# Patient Record
Sex: Female | Born: 1990 | Race: Black or African American | Hispanic: No | Smoking: Former smoker
Health system: Southern US, Community
[De-identification: ages and names within clinical notes are randomized; demographics above are authoritative.]

## PROBLEM LIST (undated history)

## (undated) DIAGNOSIS — F419 Anxiety disorder, unspecified: Secondary | ICD-10-CM

## (undated) DIAGNOSIS — E039 Hypothyroidism, unspecified: Secondary | ICD-10-CM

## (undated) DIAGNOSIS — I10 Essential (primary) hypertension: Secondary | ICD-10-CM

## (undated) DIAGNOSIS — N83209 Unspecified ovarian cyst, unspecified side: Secondary | ICD-10-CM

## (undated) DIAGNOSIS — B9689 Other specified bacterial agents as the cause of diseases classified elsewhere: Secondary | ICD-10-CM

## (undated) DIAGNOSIS — N898 Other specified noninflammatory disorders of vagina: Secondary | ICD-10-CM

## (undated) DIAGNOSIS — F32A Depression, unspecified: Secondary | ICD-10-CM

## (undated) DIAGNOSIS — N760 Acute vaginitis: Secondary | ICD-10-CM

## (undated) HISTORY — DX: Other specified noninflammatory disorders of vagina: N89.8

## (undated) HISTORY — DX: Depression, unspecified: F32.A

## (undated) HISTORY — DX: Anxiety disorder, unspecified: F41.9

## (undated) HISTORY — PX: CHOLECYSTECTOMY: SHX55

## (undated) HISTORY — DX: Acute vaginitis: N76.0

## (undated) HISTORY — PX: WISDOM TOOTH EXTRACTION: SHX21

## (undated) HISTORY — DX: Other specified bacterial agents as the cause of diseases classified elsewhere: B96.89

## (undated) HISTORY — DX: Hypothyroidism, unspecified: E03.9

## (undated) HISTORY — DX: Essential (primary) hypertension: I10

---
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
## 2008-01-09 DIAGNOSIS — O165 Unspecified maternal hypertension, complicating the puerperium: Secondary | ICD-10-CM

## 2008-02-18 ENCOUNTER — Emergency Department (HOSPITAL_COMMUNITY): Admission: EM | Admit: 2008-02-18 | Discharge: 2008-02-18 | Payer: Self-pay | Admitting: Emergency Medicine

## 2008-04-06 ENCOUNTER — Emergency Department (HOSPITAL_COMMUNITY): Admission: EM | Admit: 2008-04-06 | Discharge: 2008-04-07 | Payer: Self-pay | Admitting: Emergency Medicine

## 2008-05-29 ENCOUNTER — Emergency Department (HOSPITAL_COMMUNITY): Admission: EM | Admit: 2008-05-29 | Discharge: 2008-05-29 | Payer: Self-pay | Admitting: Emergency Medicine

## 2008-06-12 ENCOUNTER — Emergency Department (HOSPITAL_COMMUNITY): Admission: EM | Admit: 2008-06-12 | Discharge: 2008-06-12 | Payer: Self-pay | Admitting: Emergency Medicine

## 2008-06-21 ENCOUNTER — Ambulatory Visit (HOSPITAL_COMMUNITY): Admission: RE | Admit: 2008-06-21 | Discharge: 2008-06-21 | Payer: Self-pay | Admitting: General Surgery

## 2008-06-21 ENCOUNTER — Encounter (INDEPENDENT_AMBULATORY_CARE_PROVIDER_SITE_OTHER): Payer: Self-pay | Admitting: General Surgery

## 2009-03-28 ENCOUNTER — Emergency Department (HOSPITAL_COMMUNITY): Admission: EM | Admit: 2009-03-28 | Discharge: 2009-03-28 | Payer: Self-pay | Admitting: Emergency Medicine

## 2010-12-22 NOTE — H&P (Signed)
NAME:  Carrie Mckenzie, Carrie Mckenzie            ACCOUNT NO.:  [id number]   MEDICAL RECORD NO.:  [id number]          PATIENT TYPE:  AMB   LOCATION:  DAY                           FACILITY:  [facility]   PHYSICIAN:  Dalia Heading, M.D.  DATE OF BIRTH:  10-24-1990   DATE OF ADMISSION:  DATE OF DISCHARGE:  LH                              HISTORY & PHYSICAL   CHIEF COMPLAINT:  Cholecystitis, cholelithiasis.   HISTORY OF PRESENT ILLNESS:  The patient is a 20 year old black female  who was referred for evaluation and treatment for biliary colic  secondary to cholelithiasis.  She has been having intermittent right  upper quadrant abdominal pain with radiation to the right flank, nausea,  and indigestion over the past few months.  She had no problems during  her recent pregnancy.  She is not breast feeding currently.  It is made  worse with fatty foods.  No fever, chills, or jaundice have been noted.   PAST MEDICAL HISTORY:  As noted above.   PAST SURGICAL HISTORY:  Unremarkable.   CURRENT MEDICATIONS:  Potassium supplements for a potassium of 3.1.   ALLERGIES:  No known drug allergies.   REVIEW OF SYSTEMS:  Noncontributory.   PHYSICAL EXAMINATION:  GENERAL:  The patient is a well-developed and  well-nourished black female in no acute distress.  HEENT:  Reveals no scleral icterus.  LUNGS:  Clear to auscultation with equal breath sounds bilaterally.  HEART:  Reveals a regular rate and rhythm without S3, S4, or murmurs.  ABDOMEN:  Soft and nondistended.  She is tender in the right upper  quadrant to palpation minimally.  No hepatosplenomegaly, mass, or  hernias were identified.  Ultrasound of the gallbladder reveals  cholelithiasis with a normal common bile duct.  Her liver enzyme tests  have been elevated in the past with a normal total bilirubin.   IMPRESSION:  Cholecystitis, cholelithiasis.   PLAN:  The patient is scheduled for a laparoscopic cholecystectomy with  cholangiograms on June 21, 2008.  The risks and benefits of the  procedure including bleeding, infection, hepatobiliary injury, and the  possibility of an open procedure were fully explained to the patient,  who gave informed consent.      Dalia Heading, M.D.  Electronically Signed     MAJ/MEDQ  D:  06/13/2008  T:  06/14/2008  Job:  [id number]

## 2010-12-22 NOTE — Op Note (Signed)
NAME:  Carrie Mckenzie, Carrie Mckenzie            ACCOUNT NO.:  [id number]   MEDICAL RECORD NO.:  [id number]          PATIENT TYPE:  AMB   LOCATION:  DAY                           FACILITY:  [facility]   PHYSICIAN:  Dalia Heading, M.D.  DATE OF BIRTH:  20-Jan-1991   DATE OF PROCEDURE:  06/21/2008  DATE OF DISCHARGE:                               OPERATIVE REPORT   PREOPERATIVE DIAGNOSES:  1. Cholecystitis.  2. Cholelithiasis.  3. History of pancreatitis.   POSTOPERATIVE DIAGNOSES:  1. Cholecystitis.  2. Cholelithiasis.  3. History of pancreatitis.   PROCEDURE:  Laparoscopic cholecystectomy with cholangiograms.   SURGEON:  Dalia Heading, MD   ANESTHESIA:  General endotracheal.   INDICATIONS:  The patient is a 20 year old black female with a history  of cholelithiasis and gallstone pancreatitis who now presents for  laparoscopic cholecystectomy with cholangiograms.  The risks and  benefits of the procedure including bleeding, infection, hepatobiliary  injury, the possibility of an open procedure were fully explained to the  patient, gave informed consent.   PROCEDURE NOTE:  The patient was placed in supine position.  After  induction of general endotracheal anesthesia, the abdomen was prepped  and draped using the usual sterile technique with Betadine.  Surgical  site confirmation was performed.   An infraumbilical incision was made down to the fascia.  A Veress needle  was introduced into the abdominal cavity and confirmation of placement  was done using the saline drop test.  The abdomen was then insufflated  to 16 mmHg pressure.  An 11-mm trocar was introduced into the abdominal  cavity under direct visualization without difficulty.  The patient was  placed in reversed Trendelenburg position.  An additional 11-mm trocar  was placed in the epigastric region, 5-mm trocar was placed in the right  upper quadrant and right flank regions.  Liver was inspected and noted  to be within normal  limits.  The gallbladder was retracted superiorly  and laterally.  The dissection was begun around the infundibulum in the  gallbladder.  The cystic duct was first identified.  Its juncture to the  infundibulum fully identified.  A single EndoClip was placed proximally  on the cystic duct.  An incision was made in the cystic duct and a  cholangiocatheter was inserted.  Under digital fluoroscopy, a  cholangiogram was performed.  The hepatobiliary tree was within normal  limits.  No dilatation or intrahepatic defects were noted within the  biliary tree.  The dye flowed freely into the duodenum.  The system was  flushed with normal saline and a cholangiocatheter removed.  Multiple  EndoClips were placed distally on the cystic duct and cystic duct was  divided.  Cystic artery was likewise ligated and divided.  The  gallbladder was then freed away from the gallbladder fossa using Bovie  electrocautery.  The gallbladder was delivered through the epigastric  trocar site using EndoCatch bag.  The gallbladder fossa was inspected.  No abnormal bleeding or bile leakage was noted.  Surgicel was placed in  the gallbladder fossa.  All fluid and air were then evacuated from the  abdominal  cavity prior to removal of the trocars.   All wounds were irrigated with normal saline.  All wounds were injected  with 0.5% Sensorcaine.  The infraumbilical fascia was reapproximated  using an 0 Vicryl interrupted suture.  All skin incisions were closed  using staples.  Betadine ointment and dry sterile dressings were  applied.   All tape and needle counts were correct at the end of procedure.  The  patient was extubated in the operating room, went back to recovery room  awake in stable condition.   COMPLICATIONS:  None.   SPECIMEN:  Gallbladder.   ESTIMATED BLOOD LOSS:  Minimal.      Dalia Heading, M.D.  Electronically Signed     MAJ/MEDQ  D:  06/21/2008  T:  06/21/2008  Job:  [id number]

## 2011-05-06 LAB — URINALYSIS, ROUTINE W REFLEX MICROSCOPIC
Bilirubin Urine: NEGATIVE
Specific Gravity, Urine: 1.025
Urobilinogen, UA: 2 — ABNORMAL HIGH
pH: 6

## 2011-05-06 LAB — HCG, QUANTITATIVE, PREGNANCY: hCG, Beta Chain, Quant, S: <2

## 2011-05-06 LAB — URINE MICROSCOPIC-ADD ON

## 2011-05-10 LAB — PREGNANCY, URINE: Preg Test, Ur: NEGATIVE

## 2011-05-10 LAB — DIFFERENTIAL
Basophils Absolute: 0
Basophils Relative: 1
Eosinophils Absolute: 0.1
Monocytes Absolute: 0.4
Neutro Abs: 1.8
Neutrophils Relative %: 55

## 2011-05-10 LAB — COMPREHENSIVE METABOLIC PANEL
ALT: 245 — ABNORMAL HIGH
Albumin: 3.6
Alkaline Phosphatase: 126 — ABNORMAL HIGH
BUN: 6
Chloride: 101
Glucose, Bld: 84
Potassium: 3.8
Total Bilirubin: 0.9

## 2011-05-10 LAB — CBC
HCT: 39.4
Hemoglobin: 13.3
WBC: 3.2 — ABNORMAL LOW

## 2011-05-10 LAB — URINALYSIS, ROUTINE W REFLEX MICROSCOPIC
Bilirubin Urine: NEGATIVE
Ketones, ur: NEGATIVE
Nitrite: NEGATIVE
Protein, ur: NEGATIVE
Urobilinogen, UA: 1

## 2011-05-10 LAB — AMYLASE: Amylase: 136 — ABNORMAL HIGH

## 2011-05-11 LAB — COMPREHENSIVE METABOLIC PANEL
ALT: 65 — ABNORMAL HIGH
AST: 42 — ABNORMAL HIGH
Albumin: 3.5
CO2: 25
Calcium: 8.6
Creatinine, Ser: 0.7
GFR calc Af Amer: >60
Sodium: 135
Total Protein: 7

## 2011-05-11 LAB — DIFFERENTIAL
Eosinophils Absolute: 0
Eosinophils Relative: 0
Lymphocytes Relative: 10 — ABNORMAL LOW
Lymphs Abs: 0.5 — ABNORMAL LOW
Monocytes Absolute: 0.2
Monocytes Relative: 5

## 2011-05-11 LAB — CBC
MCHC: 34.2
Platelets: 186
RBC: 4.65

## 2011-05-13 ENCOUNTER — Encounter: Payer: Self-pay | Admitting: Emergency Medicine

## 2011-05-13 ENCOUNTER — Emergency Department (HOSPITAL_COMMUNITY): Payer: No Typology Code available for payment source

## 2011-05-13 ENCOUNTER — Emergency Department (HOSPITAL_COMMUNITY)
Admission: EM | Admit: 2011-05-13 | Discharge: 2011-05-13 | Disposition: A | Discharge status: Home / Self Care | Payer: No Typology Code available for payment source | Attending: Emergency Medicine | Admitting: Emergency Medicine

## 2011-05-13 DIAGNOSIS — Z87891 Personal history of nicotine dependence: Secondary | ICD-10-CM | POA: Insufficient documentation

## 2011-05-13 DIAGNOSIS — Z79899 Other long term (current) drug therapy: Secondary | ICD-10-CM | POA: Insufficient documentation

## 2011-05-13 DIAGNOSIS — S8000XA Contusion of unspecified knee, initial encounter: Secondary | ICD-10-CM | POA: Insufficient documentation

## 2011-05-13 MED ORDER — ACETAMINOPHEN 500 MG PO TABS
1000.0000 mg | ORAL_TABLET | Freq: Once | ORAL | Status: AC
  Administered 2011-05-13: 1000 mg via ORAL
  Filled 2011-05-13: qty 2
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
## 2011-05-20 NOTE — ED Provider Notes (Signed)
Medical screening examination/treatment/procedure(s) were performed by non-physician practitioner and as supervising physician I was immediately available for consultation/collaboration.   Joya Gaskins, MD 05/20/11 [phone]

## 2012-10-26 ENCOUNTER — Other Ambulatory Visit (HOSPITAL_COMMUNITY): Payer: Self-pay | Admitting: Family Medicine

## 2012-10-26 DIAGNOSIS — R52 Pain, unspecified: Secondary | ICD-10-CM

## 2012-11-01 ENCOUNTER — Ambulatory Visit (HOSPITAL_COMMUNITY)
Admission: RE | Admit: 2012-11-01 | Discharge: 2012-11-01 | Disposition: A | Discharge status: Home / Self Care | Payer: BC Managed Care – PPO | Source: Ambulatory Visit | Attending: Family Medicine | Admitting: Family Medicine

## 2012-11-01 DIAGNOSIS — N644 Mastodynia: Secondary | ICD-10-CM | POA: Insufficient documentation

## 2012-11-01 DIAGNOSIS — R52 Pain, unspecified: Secondary | ICD-10-CM
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
## 2013-08-09 NOTE — L&D Delivery Note (Signed)
Delivery Note At 5:51 PM a viable female was delivered via Vaginal, Spontaneous Delivery (Presentation: Right Occiput Anterior).  APGAR: 8, 9 ; weight pending .   Placenta status: Intact, Spontaneous.  Cord: 3 vessels with the following complications: None.  Cord pH: pending  Anesthesia: Epidural  Episiotomy: None Lacerations: None Suture Repair: N/A Est. Blood Loss (mL):   Mom to postpartum.  Baby to Couplet care / Skin to Skin.  Joanna Puff 04/02/2014, 6:27 PM

## 2013-08-17 ENCOUNTER — Other Ambulatory Visit: Payer: Self-pay | Admitting: Obstetrics & Gynecology

## 2013-08-17 DIAGNOSIS — O3680X0 Pregnancy with inconclusive fetal viability, not applicable or unspecified: Secondary | ICD-10-CM

## 2013-08-21 ENCOUNTER — Ambulatory Visit (INDEPENDENT_AMBULATORY_CARE_PROVIDER_SITE_OTHER): Payer: BC Managed Care – PPO

## 2013-08-21 ENCOUNTER — Encounter (INDEPENDENT_AMBULATORY_CARE_PROVIDER_SITE_OTHER): Payer: Self-pay

## 2013-08-21 DIAGNOSIS — O3680X0 Pregnancy with inconclusive fetal viability, not applicable or unspecified: Secondary | ICD-10-CM

## 2013-08-21 NOTE — Progress Notes (Signed)
U/S(7+2wks)-transabdominal u/s performed, single IUP with +FCA noted, FHR-153 bpm, cx appears long and closed (3.1cm), bilateral adnexa appears WNL C.L. Noted on LT, CRL c/w LMP dates

## 2013-08-27 ENCOUNTER — Encounter: Payer: Self-pay | Admitting: Women's Health

## 2013-08-27 ENCOUNTER — Other Ambulatory Visit (HOSPITAL_COMMUNITY)
Admission: RE | Admit: 2013-08-27 | Discharge: 2013-08-27 | Disposition: A | Discharge status: Home / Self Care | Payer: BC Managed Care – PPO | Source: Ambulatory Visit | Attending: Obstetrics & Gynecology | Admitting: Obstetrics & Gynecology

## 2013-08-27 ENCOUNTER — Ambulatory Visit (INDEPENDENT_AMBULATORY_CARE_PROVIDER_SITE_OTHER): Payer: BC Managed Care – PPO | Admitting: Women's Health

## 2013-08-27 VITALS — BP 118/60 | Ht 65.0 in | Wt 187.0 lb

## 2013-08-27 DIAGNOSIS — Z113 Encounter for screening for infections with a predominantly sexual mode of transmission: Secondary | ICD-10-CM | POA: Insufficient documentation

## 2013-08-27 DIAGNOSIS — Z124 Encounter for screening for malignant neoplasm of cervix: Secondary | ICD-10-CM | POA: Insufficient documentation

## 2013-08-27 DIAGNOSIS — Z3480 Encounter for supervision of other normal pregnancy, unspecified trimester: Secondary | ICD-10-CM | POA: Insufficient documentation

## 2013-08-27 DIAGNOSIS — Z331 Pregnant state, incidental: Secondary | ICD-10-CM

## 2013-08-27 DIAGNOSIS — F419 Anxiety disorder, unspecified: Secondary | ICD-10-CM | POA: Insufficient documentation

## 2013-08-27 DIAGNOSIS — Z1389 Encounter for screening for other disorder: Secondary | ICD-10-CM

## 2013-08-27 DIAGNOSIS — O219 Vomiting of pregnancy, unspecified: Secondary | ICD-10-CM

## 2013-08-27 DIAGNOSIS — F17200 Nicotine dependence, unspecified, uncomplicated: Secondary | ICD-10-CM

## 2013-08-27 DIAGNOSIS — O99340 Other mental disorders complicating pregnancy, unspecified trimester: Secondary | ICD-10-CM

## 2013-08-27 DIAGNOSIS — O09299 Supervision of pregnancy with other poor reproductive or obstetric history, unspecified trimester: Secondary | ICD-10-CM

## 2013-08-27 LAB — CBC
HCT: 38.7 % (ref 36.0–46.0)
Hemoglobin: 13 g/dL (ref 12.0–15.0)
MCH: 28.8 pg (ref 26.0–34.0)
MCHC: 33.6 g/dL (ref 30.0–36.0)
MCV: 85.8 fL (ref 78.0–100.0)
PLATELETS: 244 10*3/uL (ref 150–400)
RBC: 4.51 MIL/uL (ref 3.87–5.11)
RDW: 14.2 % (ref 11.5–15.5)
WBC: 4.5 10*3/uL (ref 4.0–10.5)

## 2013-08-27 LAB — POCT URINALYSIS DIPSTICK
GLUCOSE UA: NEGATIVE
Ketones, UA: NEGATIVE
Leukocytes, UA: NEGATIVE
NITRITE UA: NEGATIVE

## 2013-08-27 LAB — HEPATITIS B SURFACE ANTIGEN: HEP B S AG: NEGATIVE

## 2013-08-27 LAB — HIV ANTIBODY (ROUTINE TESTING W REFLEX): HIV: NONREACTIVE

## 2013-08-27 MED ORDER — DOXYLAMINE-PYRIDOXINE 10-10 MG PO TBEC: 10.0000 mg | DELAYED_RELEASE_TABLET | ORAL | Status: DC

## 2013-08-27 MED ORDER — PRENATAL PLUS 27-1 MG PO TABS: 1.0000 | ORAL_TABLET | Freq: Every day | ORAL | Status: DC

## 2013-08-27 NOTE — Patient Instructions (Signed)
Nausea & Vomiting  Have saltine crackers or pretzels by your bed and eat a few bites before you raise your head out of bed in the morning  Eat small frequent meals throughout the day instead of large meals  Drink plenty of fluids throughout the day to stay hydrated, just don't drink a lot of fluids with your meals.  This can make your stomach fill up faster making you feel sick  Do not brush your teeth right after you eat  Products with real ginger are good for nausea, like ginger ale and ginger hard candy Make sure it says made with real ginger!  Sucking on sour candy like lemon heads is also good for nausea  If your prenatal vitamins make you nauseated, take them at night so you will sleep through the nausea  If you feel like you need medicine for the nausea & vomiting please let us know  If you are unable to keep any fluids or food down please let us know   Constipation  Drink plenty of fluid, preferably water, throughout the day  Eat foods high in fiber such as fruits, vegetables, and grains  Exercise, such as walking, is a good way to keep your bowels regular  Drink warm fluids, especially warm prune juice, or decaf coffee  Eat a 1/2 cup of real oatmeal (not instant), 1/2 cup applesauce, and 1/2-1 cup warm prune juice every day  If needed, you may take Colace (docusate sodium) stool softener once or twice a day to help keep the stool soft. If you are pregnant, wait until you are out of your first trimester (12-14 weeks of pregnancy)  If you still are having problems with constipation, you may take Miralax once daily as needed to help keep your bowels regular.  If you are pregnant, wait until you are out of your first trimester (12-14 weeks of pregnancy)    Pregnancy - First Trimester During sexual intercourse, millions of sperm go into the vagina. Only 1 sperm will penetrate and fertilize the female egg while it is in the Fallopian tube. One week later, the fertilized egg  implants into the wall of the uterus. An embryo begins to develop into a baby. At 6 to 8 weeks, the eyes and face are formed and the heartbeat can be seen on ultrasound. At the end of 12 weeks (first trimester), all the baby's organs are formed. Now that you are pregnant, you will want to do everything you can to have a healthy baby. Two of the most important things are to get good prenatal care and follow your caregiver's instructions. Prenatal care is all the medical care you receive before the baby's birth. It is given to prevent, find, and treat problems during the pregnancy and childbirth. PRENATAL EXAMS  During prenatal visits, your weight, blood pressure, and urine are checked. This is done to make sure you are healthy and progressing normally during the pregnancy.  A pregnant woman should gain 25 to 35 pounds during the pregnancy. However, if you are overweight or underweight, your caregiver will advise you regarding your weight.  Your caregiver will ask and answer questions for you.  Blood work, cervical cultures, other necessary tests, and a Pap test are done during your prenatal exams. These tests are done to check on your health and the probable health of your baby. Tests are strongly recommended and done for HIV with your permission. This is the virus that causes AIDS. These tests are done because medicines   can be given to help prevent your baby from being born with this infection should you have been infected without knowing it. Blood work is also used to find out your blood type, previous infections, and follow your blood levels (hemoglobin).  Low hemoglobin (anemia) is common during pregnancy. Iron and vitamins are given to help prevent this. Later in the pregnancy, blood tests for diabetes will be done along with any other tests if any problems develop.  You may need other tests to make sure you and the baby are doing well. CHANGES DURING THE FIRST TRIMESTER  Your body goes through  many changes during pregnancy. They vary from person to person. Talk to your caregiver about changes you notice and are concerned about. Changes can include:  Your menstrual period stops.  The egg and sperm carry the genes that determine what you look like. Genes from you and your partner are forming a baby. The female genes determine whether the baby is a boy or a girl.  Your body increases in girth and you may feel bloated.  Feeling sick to your stomach (nauseous) and throwing up (vomiting). If the vomiting is uncontrollable, call your caregiver.  Your breasts will begin to enlarge and become tender.  Your nipples may stick out more and become darker.  The need to urinate more. Painful urination may mean you have a bladder infection.  Tiring easily.  Loss of appetite.  Cravings for certain kinds of food.  At first, you may gain or lose a couple of pounds.  You may have changes in your emotions from day to day (excited to be pregnant or concerned something may go wrong with the pregnancy and baby).  You may have more vivid and strange dreams. HOME CARE INSTRUCTIONS   It is very important to avoid all smoking, alcohol and non-prescribed drugs during your pregnancy. These affect the formation and growth of the baby. Avoid chemicals while pregnant to ensure the delivery of a healthy infant.  Start your prenatal visits by the 12th week of pregnancy. They are usually scheduled monthly at first, then more often in the last 2 months before delivery. Keep your caregiver's appointments. Follow your caregiver's instructions regarding medicine use, blood and lab tests, exercise, and diet.  During pregnancy, you are providing food for you and your baby. Eat regular, well-balanced meals. Choose foods such as meat, fish, milk and other low fat dairy products, vegetables, fruits, and whole-grain breads and cereals. Your caregiver will tell you of the ideal weight gain.  You can help morning  sickness by keeping soda crackers at the bedside. Eat a couple before arising in the morning. You may want to use the crackers without salt on them.  Eating 4 to 5 small meals rather than 3 large meals a day also may help the nausea and vomiting.  Drinking liquids between meals instead of during meals also seems to help nausea and vomiting.  A physical sexual relationship may be continued throughout pregnancy if there are no other problems. Problems may be early (premature) leaking of amniotic fluid from the membranes, vaginal bleeding, or belly (abdominal) pain.  Exercise regularly if there are no restrictions. Check with your caregiver or physical therapist if you are unsure of the safety of some of your exercises. Greater weight gain will occur in the last 2 trimesters of pregnancy. Exercising will help:  Control your weight.  Keep you in shape.  Prepare you for labor and delivery.  Help you lose your pregnancy   weight after you deliver your baby.  Wear a good support or jogging bra for breast tenderness during pregnancy. This may help if worn during sleep too.  Ask when prenatal classes are available. Begin classes when they are offered.  Do not use hot tubs, steam rooms, or saunas.  Wear your seat belt when driving. This protects you and your baby if you are in an accident.  Avoid raw meat, uncooked cheese, cat litter boxes, and soil used by cats throughout the pregnancy. These carry germs that can cause birth defects in the baby.  The first trimester is a good time to visit your dentist for your dental health. Getting your teeth cleaned is okay. Use a softer toothbrush and brush gently during pregnancy.  Ask for help if you have financial, counseling, or nutritional needs during pregnancy. Your caregiver will be able to offer counseling for these needs as well as refer you for other special needs.  Do not take any medicines or herbs unless told by your caregiver.  Inform your  caregiver if there is any mental or physical domestic violence.  Make a list of emergency phone numbers of family, friends, hospital, and police and fire departments.  Write down your questions. Take them to your prenatal visit.  Do not douche.  Do not cross your legs.  If you have to stand for long periods of time, rotate you feet or take small steps in a circle.  You may have more vaginal secretions that may require a sanitary pad. Do not use tampons or scented sanitary pads. MEDICINES AND DRUG USE IN PREGNANCY  Take prenatal vitamins as directed. The vitamin should contain 1 milligram of folic acid. Keep all vitamins out of reach of children. Only a couple vitamins or tablets containing iron may be fatal to a baby or young child when ingested.  Avoid use of all medicines, including herbs, over-the-counter medicines, not prescribed or suggested by your caregiver. Only take over-the-counter or prescription medicines for pain, discomfort, or fever as directed by your caregiver. Do not use aspirin, ibuprofen, or naproxen unless directed by your caregiver.  Let your caregiver also know about herbs you may be using.  Alcohol is related to a number of birth defects. This includes fetal alcohol syndrome. All alcohol, in any form, should be avoided completely. Smoking will cause low birth rate and premature babies.  Street or illegal drugs are very harmful to the baby. They are absolutely forbidden. A baby born to an addicted mother will be addicted at birth. The baby will go through the same withdrawal an adult does.  Let your caregiver know about any medicines that you have to take and for what reason you take them. SEEK MEDICAL CARE IF:  You have any concerns or worries during your pregnancy. It is better to call with your questions if you feel they cannot wait, rather than worry about them. SEEK IMMEDIATE MEDICAL CARE IF:   An unexplained oral temperature above 102 F (38.9 C) develops,  or as your caregiver suggests.  You have leaking of fluid from the vagina (birth canal). If leaking membranes are suspected, take your temperature and inform your caregiver of this when you call.  There is vaginal spotting or bleeding. Notify your caregiver of the amount and how many pads are used.  You develop a bad smelling vaginal discharge with a change in the color.  You continue to feel sick to your stomach (nauseated) and have no relief from remedies suggested. You   vomit blood or coffee ground-like materials.  You lose more than 2 pounds of weight in 1 week.  You gain more than 2 pounds of weight in 1 week and you notice swelling of your face, hands, feet, or legs.  You gain 5 pounds or more in 1 week (even if you do not have swelling of your hands, face, legs, or feet).  You get exposed to German measles and have never had them.  You are exposed to fifth disease or chickenpox.  You develop belly (abdominal) pain. Round ligament discomfort is a common non-cancerous (benign) cause of abdominal pain in pregnancy. Your caregiver still must evaluate this.  You develop headache, fever, diarrhea, pain with urination, or shortness of breath.  You fall or are in a car accident or have any kind of trauma.  There is mental or physical violence in your home. Document Released: 07/20/2001 Document Revised: 04/19/2012 Document Reviewed: 01/21/2009 [employer] Patient Information 2014 [employer].  

## 2013-08-27 NOTE — Progress Notes (Addendum)
Subjective:    Carrie Mckenzie is a 23 y.o. 833P1011 African American female at 7521w1d by LMP which is w/in 3d of 7wk u/s, being seen today for her first obstetrical visit.  Her obstetrical history is significant for smoker and term SVD in 2009 complicated by chorioamnionitis w/ neonatal pneumonia requiring 1wk NICU stay.  Pregnancy history fully reviewed. Reports h/o LSIL pap 3058yr ago.    Patient reports nausea, vomiting has improved, and constipation. Denies vb, cramping, uti s/s, abnormal/malodorous vag d/c, or vulvovaginal itching/irritation.  Filed Vitals:   08/27/13 0941  BP: 118/60  Weight: 187 lb (84.823 kg)    HISTORY: OB History  Gravida Para Term Preterm AB SAB TAB Ectopic Multiple Living  3 1 1  0 1 1 0 0 0 1    # Outcome Date GA Lbr Len/2nd Weight Sex Delivery Anes PTL Lv  3 CUR           2 TRM 01/09/08 7225w0d  7 lb 6 oz (3.345 kg) F SVD EPI  Y  1 SAB              Past Medical History  Diagnosis Date  . Anxiety    Past Surgical History  Procedure Laterality Date  . Cholecystectomy    . Wisdom tooth extraction     Family History  Problem Relation Age of Onset  . Diabetes Mother   . Thyroid disease Mother     had thyroid removed  . Hypertension Father   . Cancer Father     prostate  . Diabetes Maternal Aunt   . Diabetes Maternal Uncle   . Cancer Maternal Grandmother     ovarian, uterine  . Diabetes Maternal Aunt   . Diabetes Maternal Aunt   . Diabetes Maternal Uncle   . Diabetes Maternal Uncle   . Diabetes Maternal Uncle   . Diabetes Maternal Uncle   . Diabetes Maternal Uncle   . Diabetes Maternal Uncle      Exam    Pelvic Exam:    Perineum: Normal Perineum   Vulva: normal   Vagina:  normal mucosa, normal discharge, no palpable nodules   Uterus Normal size/shape/contour for GA     Cervix: normal   Adnexa: Not palpable   Urinary: urethral meatus normal    System:     Skin: normal coloration and turgor, no rashes    Neurologic: oriented,  normal mood   Extremities: normal strength, tone, and muscle mass   HEENT PERRLA   Mouth/Teeth mucous membranes moist   Cardiovascular: regular rate and rhythm   Respiratory:  appears well, vitals normal, no respiratory distress, acyanotic, normal RR   Abdomen: soft, non-tender    Thin prep pap smear obtained high risk HPV cotesting FHR: + via informal u/s   Assessment:    Pregnancy: G3P1011 Patient Active Problem List   Diagnosis Date Noted  . Supervision of other normal pregnancy 08/27/2013    Priority: High      7921w1d G3P1011 New OB visit Smoker Nausea of pregnancy Constipation Anxiety on zoloft   Plan:     Initial labs drawn Continue prenatal vitamins Problem list reviewed and updated Reviewed n/v relief measures and warning s/s to report Reviewed recommended weight gain based on pre-gravid BMI Encouraged well-balanced diet Genetic Screening discussed Integrated Screen: requested Cystic fibrosis screening discussed requested Ultrasound discussed; fetal survey: requested Follow up in 4 weeks for 1st it/nt and visit Rx diclegis, prior auth sent, and 2 samples given Rx prenatal  plus w/ 11 RF Gave printed info on 1st trimester, n/v of pregnancy, and constipation Continue zoloft 100mg  daily as previously rx'd by PCP  Marge Duncans 08/27/2013 10:23 AM

## 2013-08-28 ENCOUNTER — Encounter: Payer: Self-pay | Admitting: Women's Health

## 2013-08-28 DIAGNOSIS — Z2839 Other underimmunization status: Secondary | ICD-10-CM | POA: Insufficient documentation

## 2013-08-28 DIAGNOSIS — O9989 Other specified diseases and conditions complicating pregnancy, childbirth and the puerperium: Secondary | ICD-10-CM

## 2013-08-28 DIAGNOSIS — Z283 Underimmunization status: Secondary | ICD-10-CM | POA: Insufficient documentation

## 2013-08-28 DIAGNOSIS — O09899 Supervision of other high risk pregnancies, unspecified trimester: Secondary | ICD-10-CM | POA: Insufficient documentation

## 2013-08-28 LAB — DRUG SCREEN, URINE, NO CONFIRMATION
AMPHETAMINE SCRN UR: NEGATIVE
Barbiturate Quant, Ur: NEGATIVE
Benzodiazepines.: NEGATIVE
Cocaine Metabolites: NEGATIVE
Creatinine,U: 669.7 mg/dL
MARIJUANA METABOLITE: NEGATIVE
METHADONE: NEGATIVE
OPIATE SCREEN, URINE: NEGATIVE
PROPOXYPHENE: NEGATIVE
Phencyclidine (PCP): NEGATIVE

## 2013-08-28 LAB — OXYCODONE SCREEN, UA, RFLX CONFIRM: Oxycodone Screen, Ur: NEGATIVE ng/mL

## 2013-08-28 LAB — ANTIBODY SCREEN: ANTIBODY SCREEN: NEGATIVE

## 2013-08-28 LAB — ABO AND RH: RH TYPE: POSITIVE

## 2013-08-28 LAB — URINALYSIS
Glucose, UA: NEGATIVE mg/dL
HGB URINE DIPSTICK: NEGATIVE
Nitrite: NEGATIVE
PROTEIN: NEGATIVE mg/dL
Specific Gravity, Urine: >1.03 — ABNORMAL HIGH (ref 1.005–1.030)
UROBILINOGEN UA: 1 mg/dL (ref 0.0–1.0)
pH: 6 (ref 5.0–8.0)

## 2013-08-28 LAB — SICKLE CELL SCREEN: Sickle Cell Screen: NEGATIVE

## 2013-08-28 LAB — CYSTIC FIBROSIS DIAGNOSTIC STUDY

## 2013-08-28 LAB — RPR

## 2013-08-28 LAB — RUBELLA SCREEN: RUBELLA: 0.84 {index} (ref <0.90)

## 2013-08-28 LAB — VARICELLA ZOSTER ANTIBODY, IGG: Varicella IgG: 903.7 Index — ABNORMAL HIGH (ref <135.00)

## 2013-08-29 ENCOUNTER — Encounter: Payer: Self-pay | Admitting: Women's Health

## 2013-08-29 LAB — URINE CULTURE
COLONY COUNT: NO GROWTH
Organism ID, Bacteria: NO GROWTH

## 2013-08-30 ENCOUNTER — Telehealth: Payer: Self-pay | Admitting: Women's Health

## 2013-08-30 DIAGNOSIS — O09899 Supervision of other high risk pregnancies, unspecified trimester: Secondary | ICD-10-CM

## 2013-08-30 DIAGNOSIS — Z3480 Encounter for supervision of other normal pregnancy, unspecified trimester: Secondary | ICD-10-CM

## 2013-08-30 DIAGNOSIS — O9989 Other specified diseases and conditions complicating pregnancy, childbirth and the puerperium: Principal | ICD-10-CM

## 2013-08-30 DIAGNOSIS — Z283 Underimmunization status: Secondary | ICD-10-CM

## 2013-08-30 MED ORDER — TERCONAZOLE 0.4 % VA CREA: 1.0000 | TOPICAL_CREAM | Freq: Every day | VAGINAL | Status: DC

## 2013-08-30 NOTE — Telephone Encounter (Signed)
terazol 7 e prescribed 

## 2013-09-24 ENCOUNTER — Other Ambulatory Visit: Payer: Medicaid Other

## 2013-09-24 ENCOUNTER — Ambulatory Visit (INDEPENDENT_AMBULATORY_CARE_PROVIDER_SITE_OTHER): Payer: BC Managed Care – PPO | Admitting: Women's Health

## 2013-09-24 ENCOUNTER — Ambulatory Visit (INDEPENDENT_AMBULATORY_CARE_PROVIDER_SITE_OTHER): Payer: BC Managed Care – PPO

## 2013-09-24 ENCOUNTER — Other Ambulatory Visit: Payer: Self-pay | Admitting: Women's Health

## 2013-09-24 ENCOUNTER — Encounter: Payer: Medicaid Other | Admitting: Women's Health

## 2013-09-24 ENCOUNTER — Encounter: Payer: Self-pay | Admitting: Women's Health

## 2013-09-24 VITALS — BP 120/60 | Wt 177.0 lb

## 2013-09-24 DIAGNOSIS — O210 Mild hyperemesis gravidarum: Secondary | ICD-10-CM

## 2013-09-24 DIAGNOSIS — Z1389 Encounter for screening for other disorder: Secondary | ICD-10-CM

## 2013-09-24 DIAGNOSIS — O09299 Supervision of pregnancy with other poor reproductive or obstetric history, unspecified trimester: Secondary | ICD-10-CM

## 2013-09-24 DIAGNOSIS — O99340 Other mental disorders complicating pregnancy, unspecified trimester: Secondary | ICD-10-CM

## 2013-09-24 DIAGNOSIS — Z36 Encounter for antenatal screening of mother: Secondary | ICD-10-CM

## 2013-09-24 DIAGNOSIS — Z331 Pregnant state, incidental: Secondary | ICD-10-CM

## 2013-09-24 DIAGNOSIS — Z3480 Encounter for supervision of other normal pregnancy, unspecified trimester: Secondary | ICD-10-CM

## 2013-09-24 DIAGNOSIS — O219 Vomiting of pregnancy, unspecified: Secondary | ICD-10-CM

## 2013-09-24 LAB — POCT URINALYSIS DIPSTICK
Blood, UA: NEGATIVE
Glucose, UA: NEGATIVE
Leukocytes, UA: NEGATIVE
Nitrite, UA: NEGATIVE

## 2013-09-24 MED ORDER — PROMETHAZINE HCL 25 MG RE SUPP: 25.0000 mg | Freq: Four times a day (QID) | RECTAL | Status: DC | PRN

## 2013-09-24 MED ORDER — ONDANSETRON 8 MG PO TBDP: 8.0000 mg | ORAL_TABLET | Freq: Three times a day (TID) | ORAL | Status: DC | PRN

## 2013-09-24 NOTE — Progress Notes (Signed)
U/S(12+1wks)-active fetus, meas c/w dates, fluid wnl, anterior Gr 0 placenta, cx appears closed, FHR-153 bpm, NB present, NT- 1.1654mm

## 2013-09-24 NOTE — Patient Instructions (Signed)
Hyperemesis Gravidarum  Hyperemesis gravidarum is a severe form of nausea and vomiting that happens during pregnancy. Hyperemesis is worse than morning sickness. It may cause you to have nausea or vomiting all day for many days. It may keep you from eating and drinking enough food and liquids. Hyperemesis usually occurs during the first half (the first 20 weeks) of pregnancy. It often goes away once a woman is in her second half of pregnancy. However, sometimes hyperemesis continues through an entire pregnancy.   CAUSES   The cause of this condition is not completely known but is thought to be related to changes in the body's hormones when pregnant. It could be from the high level of the pregnancy hormone or an increase in estrogen in the body.   SIGNS AND SYMPTOMS   · Severe nausea and vomiting.  · Nausea that does not go away.  · Vomiting that does not allow you to keep any food down.  · Weight loss and body fluid loss (dehydration).  · Having no desire to eat or not liking food you have previously enjoyed.  DIAGNOSIS   Your health care provider will do a physical exam and ask you about your symptoms. He or she may also order blood tests and urine tests to make sure something else is not causing the problem.   TREATMENT   You may only need medicine to control the problem. If medicines do not control the nausea and vomiting, you will be treated in the hospital to prevent dehydration, increased acid in the blood (acidosis), weight loss, and changes in the electrolytes in your body that may harm the unborn baby (fetus). You may need IV fluids.   HOME CARE INSTRUCTIONS   · Only take over-the-counter or prescription medicines as directed by your health care provider.  · Try eating a couple of dry crackers or toast in the morning before getting out of bed.  · Avoid foods and smells that upset your stomach.  · Avoid fatty and spicy foods.  · Eat 5 6 small meals a day.  · Do not drink when eating meals. Drink between  meals.  · For snacks, eat high-protein foods, such as cheese.  · Eat or suck on things that have ginger in them. Ginger helps nausea.  · Avoid food preparation. The smell of food can spoil your appetite.  · Avoid iron pills and iron in your multivitamins until after 3 4 months of being pregnant. However, consult with your health care provider before stopping any prescribed iron pills.  SEEK MEDICAL CARE IF:   · Your abdominal pain increases.  · You have a severe headache.  · You have vision problems.  · You are losing weight.  SEEK IMMEDIATE MEDICAL CARE IF:   · You are unable to keep fluids down.  · You vomit blood.  · You have constant nausea and vomiting.  · You have excessive weakness.  · You have extreme thirst.  · You have dizziness or fainting.  · You have a fever or persistent symptoms for more than 2 3 days.  · You have a fever and your symptoms suddenly get worse.  MAKE SURE YOU:   · Understand these instructions.  · Will watch your condition.  · Will get help right away if you are not doing well or get worse.  Document Released: 07/26/2005 Document Revised: 05/16/2013 Document Reviewed: 03/07/2013  ExitCare® Patient Information ©2014 [employer].

## 2013-09-24 NOTE — Progress Notes (Signed)
Denies cramping, lof, vb, uti s/s.  Was doing well on diclegis QID, she tried to cut it back to one q am and she has not been able to keep anything down since. Last solid food that stayed down was on Sat am, small amt of water this am that stayed down but otherwise most liquids come up. Has lost 22lbs from pre-preg weight. States this happened w/ her last pregnancy as well- never had to be hospitalized. She has moist mucous membranes, no tenting of skin- appears well- not in distress. Last vomited prior to coming in. States she vomits maybe 3x/day. Urine 4+ ketonuria. MDs not currently in office to discuss- so I consulted w/ Dr. Debroah LoopArnold who [facility]- doesn't think she needs to come in at this time- will try phenergan suppositories/zofran odt. Recommended small sips of ginger ale/water to stay hydrated, small bites of toast/crackers, and supplementing w/ boost/ensure.  Reviewed today's u/s, warning s/s to report including if antiemetics not helping [facility].  All questions answered. F/U in 2d to see how she's doing. 1st IT today.

## 2013-09-26 ENCOUNTER — Ambulatory Visit (INDEPENDENT_AMBULATORY_CARE_PROVIDER_SITE_OTHER): Payer: BC Managed Care – PPO | Admitting: Women's Health

## 2013-09-26 ENCOUNTER — Encounter: Payer: Self-pay | Admitting: Women's Health

## 2013-09-26 VITALS — BP 110/60 | Wt 178.0 lb

## 2013-09-26 DIAGNOSIS — O99340 Other mental disorders complicating pregnancy, unspecified trimester: Secondary | ICD-10-CM

## 2013-09-26 DIAGNOSIS — Z1389 Encounter for screening for other disorder: Secondary | ICD-10-CM

## 2013-09-26 DIAGNOSIS — O09299 Supervision of pregnancy with other poor reproductive or obstetric history, unspecified trimester: Secondary | ICD-10-CM

## 2013-09-26 DIAGNOSIS — O210 Mild hyperemesis gravidarum: Secondary | ICD-10-CM

## 2013-09-26 DIAGNOSIS — Z3480 Encounter for supervision of other normal pregnancy, unspecified trimester: Secondary | ICD-10-CM

## 2013-09-26 DIAGNOSIS — Z331 Pregnant state, incidental: Secondary | ICD-10-CM

## 2013-09-26 LAB — POCT URINALYSIS DIPSTICK
Blood, UA: NEGATIVE
GLUCOSE UA: NEGATIVE
KETONES UA: NEGATIVE
NITRITE UA: NEGATIVE

## 2013-09-26 NOTE — Patient Instructions (Signed)

## 2013-09-26 NOTE — Progress Notes (Addendum)
Denies cramping, lof, vb, uti s/s.  Feeling much better. Just on zofran now. No vomiting since Monday. Able to eat/drink. Has gained 1lb! No ketones in urine. Reports constipation- will give printed info.  Some dizziness when standing too quickly. Recommended changing positions slowly. Reviewed warning s/s to report.  All questions answered. F/U in 4wks for 2nd IT and visit.

## 2013-09-29 LAB — MATERNAL SCREEN, INTEGRATED #1

## 2013-10-15 ENCOUNTER — Encounter: Payer: Self-pay | Admitting: Women's Health

## 2013-10-15 ENCOUNTER — Ambulatory Visit (INDEPENDENT_AMBULATORY_CARE_PROVIDER_SITE_OTHER): Payer: Medicaid Other | Admitting: Women's Health

## 2013-10-15 VITALS — BP 106/60 | Wt 174.5 lb

## 2013-10-15 DIAGNOSIS — O09299 Supervision of pregnancy with other poor reproductive or obstetric history, unspecified trimester: Secondary | ICD-10-CM

## 2013-10-15 DIAGNOSIS — O99330 Smoking (tobacco) complicating pregnancy, unspecified trimester: Secondary | ICD-10-CM

## 2013-10-15 DIAGNOSIS — Z3480 Encounter for supervision of other normal pregnancy, unspecified trimester: Secondary | ICD-10-CM

## 2013-10-15 DIAGNOSIS — Z131 Encounter for screening for diabetes mellitus: Secondary | ICD-10-CM

## 2013-10-15 DIAGNOSIS — R55 Syncope and collapse: Secondary | ICD-10-CM

## 2013-10-15 DIAGNOSIS — O99340 Other mental disorders complicating pregnancy, unspecified trimester: Secondary | ICD-10-CM

## 2013-10-15 DIAGNOSIS — Z1389 Encounter for screening for other disorder: Secondary | ICD-10-CM

## 2013-10-15 DIAGNOSIS — Z331 Pregnant state, incidental: Secondary | ICD-10-CM

## 2013-10-15 DIAGNOSIS — F17200 Nicotine dependence, unspecified, uncomplicated: Secondary | ICD-10-CM

## 2013-10-15 LAB — POCT URINALYSIS DIPSTICK
GLUCOSE UA: NEGATIVE
Leukocytes, UA: NEGATIVE
Nitrite, UA: NEGATIVE
RBC UA: NEGATIVE

## 2013-10-15 LAB — GLUCOSE, POCT (MANUAL RESULT ENTRY): POC Glucose: 75 mg/dl (ref 70–99)

## 2013-10-15 NOTE — Progress Notes (Addendum)
Work-in: states she was getting ready this am in a hot bathroom and started getting really hot and feeling like she was going to pass out. She sat down in floor and wasn't able to see x ~604mins, never lost consciousness, was able to communicate w/ daughter who brought her cold rag and water. States she went in to work and her bp was 90s/60s- so likely r/t physiologic hypotension of 2nd trimester. Feels much better now. CBG here today 75. Hasn't eaten yet this am. Recommended increasing fluid intake, small frequent meals/snacks, increasing salt intake. If occurs again, sit/lie down immediately, get juice/snack, and don't stand until it resolves. If happens frequently to let us know.  States she is doing much better w/ n/v, hasn't vomited in >1wk, appetite has improved, actually has cravings, and some days she reports 'eating all day', although she has lost 4lbs since last visit. No longer taking diclegis d/t it not helping. Zofran helps the most, hasn't had to use phenergan lately. Moist mucous membranes. Still smoking ~4cigs/day, wants to quit. Encouraged smoking cessation, discussed potential risks associated w/ smoking and pregnancy. [facility] referral sent. To f/u in 1wk as scheduled for 2nd IT and visit.

## 2013-10-15 NOTE — Patient Instructions (Signed)
Second Trimester of Pregnancy The second trimester is from week 13 through week 28, months 4 through 6. The second trimester is often a time when you feel your best. Your body has also adjusted to being pregnant, and you begin to feel better physically. Usually, morning sickness has lessened or quit completely, you may have more energy, and you may have an increase in appetite. The second trimester is also a time when the fetus is growing rapidly. At the end of the sixth month, the fetus is about 9 inches long and weighs about 1 pounds. You will likely begin to feel the baby move (quickening) between 18 and 20 weeks of the pregnancy. BODY CHANGES Your body goes through many changes during pregnancy. The changes vary from woman to woman.   Your weight will continue to increase. You will notice your lower abdomen bulging out.  You may begin to get stretch marks on your hips, abdomen, and breasts.  You may develop headaches that can be relieved by medicines approved by your caregiver.  You may urinate more often because the fetus is pressing on your bladder.  You may develop or continue to have heartburn as a result of your pregnancy.  You may develop constipation because certain hormones are causing the muscles that push waste through your intestines to slow down.  You may develop hemorrhoids or swollen, bulging veins (varicose veins).  You may have back pain because of the weight gain and pregnancy hormones relaxing your joints between the bones in your pelvis and as a result of a shift in weight and the muscles that support your balance.  Your breasts will continue to grow and be tender.  Your gums may bleed and may be sensitive to brushing and flossing.  Dark spots or blotches (chloasma, mask of pregnancy) may develop on your face. This will likely fade after the baby is born.  A dark line from your belly button to the pubic area (linea nigra) may appear. This will likely fade after the  baby is born. WHAT TO EXPECT AT YOUR PRENATAL VISITS During a routine prenatal visit:  You will be weighed to make sure you and the fetus are growing normally.  Your blood pressure will be taken.  Your abdomen will be measured to track your baby's growth.  The fetal heartbeat will be listened to.  Any test results from the previous visit will be discussed. Your caregiver may ask you:  How you are feeling.  If you are feeling the baby move.  If you have had any abnormal symptoms, such as leaking fluid, bleeding, severe headaches, or abdominal cramping.  If you have any questions. Other tests that may be performed during your second trimester include:  Blood tests that check for:  Low iron levels (anemia).  Gestational diabetes (between 24 and 28 weeks).  Rh antibodies.  Urine tests to check for infections, diabetes, or protein in the urine.  An ultrasound to confirm the proper growth and development of the baby.  An amniocentesis to check for possible genetic problems.  Fetal screens for spina bifida and Down syndrome. HOME CARE INSTRUCTIONS   Avoid all smoking, herbs, alcohol, and unprescribed drugs. These chemicals affect the formation and growth of the baby.  Follow your caregiver's instructions regarding medicine use. There are medicines that are either safe or unsafe to take during pregnancy.  Exercise only as directed by your caregiver. Experiencing uterine cramps is a good sign to stop exercising.  Continue to eat regular,   healthy meals.  Wear a good support bra for breast tenderness.  Do not use hot tubs, steam rooms, or saunas.  Wear your seat belt at all times when driving.  Avoid raw meat, uncooked cheese, cat litter boxes, and soil used by cats. These carry germs that can cause birth defects in the baby.  Take your prenatal vitamins.  Try taking a stool softener (if your caregiver approves) if you develop constipation. Eat more high-fiber foods,  such as fresh vegetables or fruit and whole grains. Drink plenty of fluids to keep your urine clear or pale yellow.  Take warm sitz baths to soothe any pain or discomfort caused by hemorrhoids. Use hemorrhoid cream if your caregiver approves.  If you develop varicose veins, wear support hose. Elevate your feet for 15 minutes, 3 4 times a day. Limit salt in your diet.  Avoid heavy lifting, wear low heel shoes, and practice good posture.  Rest with your legs elevated if you have leg cramps or low back pain.  Visit your dentist if you have not gone yet during your pregnancy. Use a soft toothbrush to brush your teeth and be gentle when you floss.  A sexual relationship may be continued unless your caregiver directs you otherwise.  Continue to go to all your prenatal visits as directed by your caregiver. SEEK MEDICAL CARE IF:   You have dizziness.  You have mild pelvic cramps, pelvic pressure, or nagging pain in the abdominal area.  You have persistent nausea, vomiting, or diarrhea.  You have a bad smelling vaginal discharge.  You have pain with urination. SEEK IMMEDIATE MEDICAL CARE IF:   You have a fever.  You are leaking fluid from your vagina.  You have spotting or bleeding from your vagina.  You have severe abdominal cramping or pain.  You have rapid weight gain or loss.  You have shortness of breath with chest pain.  You notice sudden or extreme swelling of your face, hands, ankles, feet, or legs.  You have not felt your baby move in over an hour.  You have severe headaches that do not go away with medicine.  You have vision changes. Document Released: 07/20/2001 Document Revised: 03/28/2013 Document Reviewed: 09/26/2012 ExitCare Patient Information 2014 [employer].  

## 2013-10-18 ENCOUNTER — Encounter (HOSPITAL_COMMUNITY): Payer: Self-pay | Admitting: Emergency Medicine

## 2013-10-18 ENCOUNTER — Emergency Department (HOSPITAL_COMMUNITY)
Admission: EM | Admit: 2013-10-18 | Discharge: 2013-10-18 | Disposition: A | Discharge status: Home / Self Care | Payer: BC Managed Care – PPO | Attending: Emergency Medicine | Admitting: Emergency Medicine

## 2013-10-18 ENCOUNTER — Telehealth: Payer: Self-pay | Admitting: *Deleted

## 2013-10-18 DIAGNOSIS — F411 Generalized anxiety disorder: Secondary | ICD-10-CM | POA: Insufficient documentation

## 2013-10-18 DIAGNOSIS — Z79899 Other long term (current) drug therapy: Secondary | ICD-10-CM | POA: Insufficient documentation

## 2013-10-18 DIAGNOSIS — R197 Diarrhea, unspecified: Secondary | ICD-10-CM | POA: Insufficient documentation

## 2013-10-18 DIAGNOSIS — R112 Nausea with vomiting, unspecified: Secondary | ICD-10-CM | POA: Insufficient documentation

## 2013-10-18 DIAGNOSIS — F17200 Nicotine dependence, unspecified, uncomplicated: Secondary | ICD-10-CM | POA: Insufficient documentation

## 2013-10-18 DIAGNOSIS — R000 Tachycardia, unspecified: Secondary | ICD-10-CM | POA: Insufficient documentation

## 2013-10-18 LAB — CBC WITH DIFFERENTIAL/PLATELET
Basophils Absolute: 0 10*3/uL (ref 0.0–0.1)
Basophils Relative: 1 % (ref 0–1)
EOS ABS: 0.1 10*3/uL (ref 0.0–0.7)
EOS PCT: 2 % (ref 0–5)
HCT: 34 % — ABNORMAL LOW (ref 36.0–46.0)
HEMOGLOBIN: 12 g/dL (ref 12.0–15.0)
LYMPHS ABS: 1.3 10*3/uL (ref 0.7–4.0)
Lymphocytes Relative: 26 % (ref 12–46)
MCH: 29.9 pg (ref 26.0–34.0)
MCHC: 35.3 g/dL (ref 30.0–36.0)
MCV: 84.6 fL (ref 78.0–100.0)
MONOS PCT: 7 % (ref 3–12)
Monocytes Absolute: 0.3 10*3/uL (ref 0.1–1.0)
Neutro Abs: 3.3 10*3/uL (ref 1.7–7.7)
Neutrophils Relative %: 66 % (ref 43–77)
Platelets: 228 10*3/uL (ref 150–400)
RBC: 4.02 MIL/uL (ref 3.87–5.11)
RDW: 13.3 % (ref 11.5–15.5)
WBC: 5 10*3/uL (ref 4.0–10.5)

## 2013-10-18 LAB — COMPREHENSIVE METABOLIC PANEL
ALK PHOS: 58 U/L (ref 39–117)
ALT: 17 U/L (ref 0–35)
AST: 22 U/L (ref 0–37)
Albumin: 3.3 g/dL — ABNORMAL LOW (ref 3.5–5.2)
BUN: 5 mg/dL — AB (ref 6–23)
CALCIUM: 9.1 mg/dL (ref 8.4–10.5)
CO2: 24 mEq/L (ref 19–32)
Chloride: 104 mEq/L (ref 96–112)
Creatinine, Ser: 0.58 mg/dL (ref 0.50–1.10)
GFR calc non Af Amer: >90 mL/min (ref >90)
GLUCOSE: 85 mg/dL (ref 70–99)
Potassium: 3.8 mEq/L (ref 3.7–5.3)
Sodium: 139 mEq/L (ref 137–147)
TOTAL PROTEIN: 7.5 g/dL (ref 6.0–8.3)
Total Bilirubin: 0.3 mg/dL (ref 0.3–1.2)

## 2013-10-18 LAB — URINALYSIS, ROUTINE W REFLEX MICROSCOPIC
Glucose, UA: NEGATIVE mg/dL
Hgb urine dipstick: NEGATIVE
LEUKOCYTES UA: NEGATIVE
NITRITE: NEGATIVE
PH: 6 (ref 5.0–8.0)
Specific Gravity, Urine: >1.03 — ABNORMAL HIGH (ref 1.005–1.030)
UROBILINOGEN UA: 0.2 mg/dL (ref 0.0–1.0)

## 2013-10-18 LAB — LIPASE, BLOOD: LIPASE: 20 U/L (ref 11–59)

## 2013-10-18 LAB — URINE MICROSCOPIC-ADD ON

## 2013-10-18 MED ORDER — SODIUM CHLORIDE 0.9 % IV BOLUS (SEPSIS)
1000.0000 mL | Freq: Once | INTRAVENOUS | Status: AC
  Administered 2013-10-18: 1000 mL via INTRAVENOUS

## 2013-10-18 MED ORDER — ONDANSETRON HCL 4 MG/2ML IJ SOLN
4.0000 mg | INTRAMUSCULAR | Status: DC | PRN
  Administered 2013-10-18: 4 mg via INTRAVENOUS
  Filled 2013-10-18: qty 2

## 2013-10-18 MED ORDER — SODIUM CHLORIDE 0.9 % IV SOLN: INTRAVENOUS | Status: DC

## 2013-10-18 MED ORDER — SODIUM CHLORIDE 0.9 % IV BOLUS (SEPSIS): 500.0000 mL | Freq: Once | INTRAVENOUS | Status: DC

## 2013-10-18 MED ORDER — SODIUM CHLORIDE 0.9 % IV BOLUS (SEPSIS)
500.0000 mL | Freq: Once | INTRAVENOUS | Status: AC
Start: 2013-10-18 — End: 2013-10-18
  Administered 2013-10-18: 500 mL via INTRAVENOUS

## 2013-10-18 MED ORDER — PROMETHAZINE HCL 25 MG PO TABS: 25.0000 mg | ORAL_TABLET | Freq: Four times a day (QID) | ORAL | Status: DC | PRN

## 2013-10-18 NOTE — Telephone Encounter (Signed)
Spoke with pt. Pt has vomiting and diarrhea. Mouth not moist. Not urinating much. Advised to go to [facility] for eval and possible fluids. Pt voiced understanding. JSY

## 2013-10-18 NOTE — ED Notes (Addendum)
Patient c/o nausea, vomiting, and diarrhea that started this morning. Patient [facility] because she is [redacted] weeks pregnant. Patient unsure of any fevers but felt like she has had one. Patient reports using zofran with no relief.

## 2013-10-18 NOTE — ED Provider Notes (Signed)
CSN: [id number]     Arrival date & time 10/18/13  1004 History   First MD Initiated Contact with Patient 10/18/13 1250     Chief Complaint  Patient presents with  . Emesis      HPI Pt was seen at 1255. Per pt, c/o gradual onset and persistence of multiple intermittent episodes of N/V/D that began this morning.   Describes the stools as "watery." Others in household with similar symptoms. Hx G2P1011, EDC 04/07/14, with EGA 15 and 4/7 weeks. Pt states she has been taking zofran ODT without relief. States she has not taken phenergan PR due to diarrhea. Denies abd pain, no pelvic pain, no vaginal bleeding/discharge, no dysuria/hematuria, no CP/SOB, no back pain, no fevers, no black or blood in stools or emesis.     Past Medical History  Diagnosis Date  . Anxiety    Past Surgical History  Procedure Laterality Date  . Cholecystectomy    . Wisdom tooth extraction     Family History  Problem Relation Age of Onset  . Diabetes Mother   . Thyroid disease Mother     had thyroid removed  . Hypertension Father   . Cancer Father     prostate  . Diabetes Maternal Aunt   . Diabetes Maternal Uncle   . Cancer Maternal Grandmother     ovarian, uterine  . Diabetes Maternal Aunt   . Diabetes Maternal Aunt   . Diabetes Maternal Uncle   . Diabetes Maternal Uncle   . Diabetes Maternal Uncle   . Diabetes Maternal Uncle   . Diabetes Maternal Uncle   . Diabetes Maternal Uncle    History  Substance Use Topics  . Smoking status: Current Every Day Smoker -- 0.04 packs/day for 10 years    Types: Cigarettes  . Smokeless tobacco: Never Used  . Alcohol Use: No   OB History   Grav Para Term Preterm Abortions TAB SAB Ect Mult Living   3 1 1  0 1 0 1 0 0 1     Review of Systems ROS: Statement: All systems negative except as marked or noted in the HPI; Constitutional: Negative for fever and chills. ; ; Eyes: Negative for eye pain, redness and discharge. ; ; ENMT: Negative for ear pain, hoarseness,  nasal congestion, sinus pressure and sore throat. ; ; Cardiovascular: Negative for chest pain, palpitations, diaphoresis, dyspnea and peripheral edema. ; ; Respiratory: Negative for cough, wheezing and stridor. ; ; Gastrointestinal: +N/V/D. Negative for abdominal pain, blood in stool, hematemesis, jaundice and rectal bleeding. . ; ; Genitourinary: Negative for dysuria, flank pain and hematuria. ; ; GYN:  No vaginal bleeding, no vaginal discharge, no vulvar pain. ;; Musculoskeletal: Negative for back pain and neck pain. Negative for swelling and trauma.; ; Skin: Negative for pruritus, rash, abrasions, blisters, bruising and skin lesion.; ; Neuro: Negative for headache, lightheadedness and neck stiffness. Negative for weakness, altered level of consciousness , altered mental status, extremity weakness, paresthesias, involuntary movement, seizure and syncope.      Allergies  Review of patient's allergies indicates no known allergies.  Home Medications   Current Outpatient Rx  Name  Route  Sig  Dispense  Refill  . Doxylamine-Pyridoxine (DICLEGIS) 10-10 MG TBEC   Oral   Take 1-2 tablets by mouth 3 (three) times daily. Take 1 tablet in am, 1 in afternoon and 2 at night         . Multiple Vitamin (MULTIVITAMIN) tablet   Oral   Take 1  tablet by mouth daily.         . ondansetron (ZOFRAN-ODT) 8 MG disintegrating tablet   Oral   Take 8 mg by mouth daily.         . sertraline (ZOLOFT) 100 MG tablet   Oral   Take 100 mg by mouth at bedtime.          . promethazine (PHENERGAN) 25 MG suppository   Rectal   Place 1 suppository (25 mg total) rectally every 6 (six) hours as needed for nausea or vomiting.   20 each   0    BP 106/46  Pulse 101  Temp(Src) 98.5 F (36.9 C) (Oral)  Resp 16  Ht 5\' 5"  (1.651 m)  Wt 171 lb 1.6 oz (77.61 kg)  BMI 28.47 kg/m2  SpO2 98%  LMP 07/01/2013 Filed Vitals:   10/18/13 1048 10/18/13 1306 10/18/13 1413  BP: 122/63 106/46 101/43  Pulse: 121 101 79   Temp: 98 F (36.7 C) 98.5 F (36.9 C) 98.2 F (36.8 C)  TempSrc: Oral Oral Oral  Resp: 20 16 19   Height: 5\' 5"  (1.651 m)    Weight: 171 lb 1.6 oz (77.61 kg)    SpO2: 100% 98% 100%    Physical Exam 1300: Physical examination:  Nursing notes reviewed; Vital signs and O2 SAT reviewed;  Constitutional: Well developed, Well nourished, Well hydrated, In no acute distress; Head:  Normocephalic, atraumatic; Eyes: EOMI, PERRL, No scleral icterus; ENMT: Mouth and pharynx normal, Mucous membranes moist; Neck: Supple, Full range of motion, No lymphadenopathy; Cardiovascular: Tachycardic rate and rhythm, No murmur, rub, or gallop; Respiratory: Breath sounds clear & equal bilaterally, No rales, rhonchi, wheezes.  Speaking full sentences with ease, Normal respiratory effort/excursion; Chest: Nontender, Movement normal; Abdomen: Soft, Nontender, Nondistended, Normal bowel sounds; Genitourinary: No CVA tenderness; Extremities: Pulses normal, No tenderness, No edema, No calf edema or asymmetry.; Neuro: AA&Ox3, Major CN grossly intact.  Speech clear. No gross focal motor or sensory deficits in extremities.; Skin: Color normal, Warm, Dry.   ED Course  Procedures     EKG Interpretation None      MDM  MDM Reviewed: previous chart, nursing note and vitals Reviewed previous: labs Interpretation: labs   Results for orders placed during the hospital encounter of 10/18/13  CBC WITH DIFFERENTIAL      Result Value Ref Range   WBC 5.0  4.0 - 10.5 K/uL   RBC 4.02  3.87 - 5.11 MIL/uL   Hemoglobin 12.0  12.0 - 15.0 g/dL   HCT 62.134.0 (*) 30.836.0 - 65.746.0 %   MCV 84.6  78.0 - 100.0 fL   MCH 29.9  26.0 - 34.0 pg   MCHC 35.3  30.0 - 36.0 g/dL   RDW 84.613.3  96.211.5 - 95.215.5 %   Platelets 228  150 - 400 K/uL   Neutrophils Relative % 66  43 - 77 %   Neutro Abs 3.3  1.7 - 7.7 K/uL   Lymphocytes Relative 26  12 - 46 %   Lymphs Abs 1.3  0.7 - 4.0 K/uL   Monocytes Relative 7  3 - 12 %   Monocytes Absolute 0.3  0.1 - 1.0  K/uL   Eosinophils Relative 2  0 - 5 %   Eosinophils Absolute 0.1  0.0 - 0.7 K/uL   Basophils Relative 1  0 - 1 %   Basophils Absolute 0.0  0.0 - 0.1 K/uL  COMPREHENSIVE METABOLIC PANEL      Result Value Ref Range  Sodium 139  137 - 147 mEq/L   Potassium 3.8  3.7 - 5.3 mEq/L   Chloride 104  96 - 112 mEq/L   CO2 24  19 - 32 mEq/L   Glucose, Bld 85  70 - 99 mg/dL   BUN 5 (*) 6 - 23 mg/dL   Creatinine, Ser 0.98  0.50 - 1.10 mg/dL   Calcium 9.1  8.4 - 11.9 mg/dL   Total Protein 7.5  6.0 - 8.3 g/dL   Albumin 3.3 (*) 3.5 - 5.2 g/dL   AST 22  0 - 37 U/L   ALT 17  0 - 35 U/L   Alkaline Phosphatase 58  39 - 117 U/L   Total Bilirubin 0.3  0.3 - 1.2 mg/dL   GFR calc non Af Amer >90  >90 mL/min   GFR calc Af Amer >90  >90 mL/min  URINALYSIS, ROUTINE W REFLEX MICROSCOPIC      Result Value Ref Range   Color, Urine YELLOW  YELLOW   APPearance CLEAR  CLEAR   Specific Gravity, Urine >1.030 (*) 1.005 - 1.030   pH 6.0  5.0 - 8.0   Glucose, UA NEGATIVE  NEGATIVE mg/dL   Hgb urine dipstick NEGATIVE  NEGATIVE   Bilirubin Urine SMALL (*) NEGATIVE   Ketones, ur >80 (*) NEGATIVE mg/dL   Protein, ur TRACE (*) NEGATIVE mg/dL   Urobilinogen, UA 0.2  0.0 - 1.0 mg/dL   Nitrite NEGATIVE  NEGATIVE   Leukocytes, UA NEGATIVE  NEGATIVE  URINE MICROSCOPIC-ADD ON      Result Value Ref Range   Squamous Epithelial / LPF MANY (*) RARE   WBC, UA 3-6  <3 WBC/hpf   Bacteria, UA MANY (*) RARE  LIPASE, BLOOD      Result Value Ref Range   Lipase 20  11 - 59 U/L    1410:  Udip appears contaminated; UC pending. Pt has tol PO well while in the ED without N/V.  No stooling while in the ED.  Abd remains benign, VSS. Feels better and wants to go home now. Dx and testing d/w pt and family.  Questions answered.  Verb understanding, agreeable to d/c home with outpt f/u.                                                          Laray Anger, DO 10/20/13 1441

## 2013-10-18 NOTE — Discharge Instructions (Signed)
°Emergency Department Resource Guide °1) Find a Doctor and Pay Out of Pocket °Although you won't have to find out who is covered by your insurance plan, it is a good idea to ask around and get recommendations. You will then need to call the office and see if the doctor you have chosen will accept you as a new patient and what types of options they offer for patients who are self-pay. Some doctors offer discounts or will set up payment plans for their patients who do not have insurance, but you will need to ask so you aren't surprised when you get to your appointment. ° °2) Contact Your Local Health Department °Not all health departments have doctors that can see patients for sick visits, but many do, so it is worth a call to see if yours does. If you don't know where your local health department is, you can check in your phone book. The CDC also has a tool to help you locate your state's health department, and many state websites also have listings of all of their local health departments. ° °3) Find a Walk-in Clinic °If your illness is not likely to be very severe or complicated, you may want to try a walk in clinic. These are popping up all over the country in pharmacies, drugstores, and shopping centers. They're usually staffed by nurse practitioners or physician assistants that have been trained to treat common illnesses and complaints. They're usually fairly quick and inexpensive. However, if you have serious medical issues or chronic medical problems, these are probably not your best option. ° °No Primary Care Doctor: °- Call Health Connect at  832-8000 - they can help you locate a primary care doctor that  accepts your insurance, provides certain services, etc. °- Physician Referral Service- 1-800-533-3463 ° °Chronic Pain Problems: °Organization         Address  Phone   Notes  °[facility]  (336) 297-2271 Patients need to be referred by their primary care doctor.  ° °Medication  Assistance: °Organization         Address  Phone   Notes  °[place] [facility] [address] °[place], [place] [address] (336) 641-8030 --Must be a resident of [place] °-- Must have NO insurance coverage whatsoever (no Medicaid/ Medicare, etc.) °-- The pt. MUST have a primary care doctor that directs their care regularly and follows them in the community °  °[facility]  (866) 331-1348   °[employer]  (888) 892-1162   ° °Agencies that provide inexpensive medical care: °Organization         Address  Phone   Notes  °[facility]  (336) 832-8035   °[facility]    (336) 832-7272   °[facility] [address] °[place], [place] [address] (336) 832-4777   °[facility] [place] [facility] [address], °[place] (336) 271-4999   °[facility]    (336) 373-0678   °[facility]    (336) 272-1050   °[facility] ° [address], [place] Phone:  (336) 832-4444, Fax:  (336) 832-4440 Hours of Operation:  9 am - 6 pm, M-F.  Also accepts Medicaid/Medicare and self-pay.  °[facility] ° [address], [place] Phone: (336) 832-3150, Fax: (336) 832-3151. Hours of Operation:  8:30 am - 5:30 pm, M-F.  Also accepts Medicaid and self-pay.  °[facility] [place] [address], [place] Phone: (336) 878-6027   °[facility] [address], [place], [place] (336)723-1848, Ext. 123 Mondays & Thursdays: 7-9 AM.  First 15 patients are seen on a first come, first serve basis. °  ° °Medicaid-accepting [place] Providers: ° °Organization         Address  Phone   Notes  °[facility] [address], [place] (336) 641-2100 Also accepts self-pay patients.  °[facility] [address], [place] ° (336) 856-9996   °[facility] [address], [place]  (336) 288-8857   °[facility] [address] [place] [address], [place] (336) 299-7000   °Veita Bland [address], [place]  ° (336) 373-1557 Only accepts [place] Access Medicaid patients after they have their name applied to their card.  ° °Self-Pay (no insurance) in [place]: ° °Organization         Address  Phone   Notes  °Sickle Cell Patients, [facility] [address], [place] (336) 832-1970   °[facility] [address], [place] (336) 832-4400   °[facility] ° [address], [place] (336) 992-4800   °[facility]/Dr. Osei-Bonsu ° [address] [place] [address], [place] or [address], [place] (336) 841-8500 Phone number for both [place] and [place] locations is the same.  °[facility] [address], [place] (336) 299-0000   °[facility] [address] [place] [address], [place] or [address] (336) 852-7530 °(336) 878-2260   °[facility] [address], [place] (336) 350-1642, phone; (336) 294-5005, fax Sees patients 1st and 3rd Saturday of every month.  Must not qualify for public or private insurance (i.e. Medicaid, Medicare, [place] Health Choice, Veterans' Benefits) • Household income should be no more than 200% of the poverty level •The clinic cannot treat you if you are pregnant or think you are pregnant • Sexually transmitted diseases are not treated at the clinic.  ° ° °Dental Care: °Organization         Address  Phone  Notes  °[place] [facility] [address], [place] (336) 641-6152 Accepts children up to age 21 who are enrolled in Medicaid or [place] Health Choice; pregnant women with a Medicaid card; and children who have applied for Medicaid or [place] Health Choice, but were declined, whose parents can pay a reduced fee at time of service.  °[place]  [facility] [place]  [address], [place] (336) 641-7733 Accepts children up to age 21 who are enrolled in Medicaid or [place] Health Choice; pregnant women with a Medicaid card; and children who have applied for Medicaid or [place] Health Choice, but were declined, whose parents can pay a reduced fee at time of service.  °[facility] ° [address], [place] (336) 641-4533 Patients are seen by appointment only. Walk-ins are not accepted. [facility] will see patients 18 years of age and older. °Monday - Tuesday (8am-5pm) °Most Wednesdays (8:30-5pm) °$30 per visit, cash only  °[facility] ° [address], [place] (336) 641-4533 Patients are seen by appointment only. Walk-ins are not accepted. [facility] will see patients 18 years of age and older. °One   Wednesday Evening (Monthly: Volunteer Based).  $30 per visit, cash only  °[facility]  (919) 537-3737 for adults; Children under age 4, call Graduate Pediatric Dentistry at ([phone]) [phone]. Children aged 4-14, please call ([phone]) [phone] to request a pediatric application. ° Dental services are provided in all areas of dental care including fillings, crowns and bridges, complete and partial dentures, implants, gum treatment, root canals, and extractions. Preventive care is also provided. Treatment is provided to both adults and children. °Patients are selected via a lottery and there is often a waiting list. °  °[facility] [address], °[place] ° (336) 763-8833 [email or web address] °  °[facility] [address], [place], [place] (336)723-1848, Ext. 123 Second and Fourth Thursday of each month, opens at 6:30 AM; Clinic ends at 9 AM.  Patients are seen on a first-come first-served basis, and a limited number are seen during each clinic.  ° °[facility] ° [address], [place], [place] (336) 723-7904    Eligibility Requirements °You must have lived in [place], [place], or [place] counties for at least the last three months. °  You cannot be eligible for state or federal sponsored healthcare insurance, including Veterans Administration, Medicaid, or Medicare. °  You generally cannot be eligible for healthcare insurance through your employer.  °  How to apply: °Eligibility screenings are held every Tuesday and Wednesday afternoon from 1:00 pm until 4:00 pm. You do not need an appointment for the interview!  °[facility] [address], [place], [place] 336-631-2330   °[facility]  336-342-8273   °[facility]  336-703-3100   °[facility]  336-570-6415   ° °Behavioral Health Resources in the Community: °Intensive Outpatient Programs °Organization         Address  Phone  Notes  °[facility] [address], [place], [place] 336-878-6098   °[facility] [address], [place], [place] 336-832-9800   °[facility] [address], [place], [place] ° 336-882-2125   °[facility] [address],  °[place], [place] 1-800-853-5163 or 336-641-4981   °Substance Abuse Resources °Organization         Address  Phone  Notes  °[facility]  336-882-2125   °[facility]  336-784-9470   °[facility]  336-285-9073   °[facility]  336-845-3988   °Residential & Outpatient Substance Abuse Program  1-800-659-3381   °Psychological Services °Organization         Address  Phone  Notes  °[facility]  336- 832-9600   °[facility]  336- 378-7881   °[facility] [address], [place] 1-800-853-5163 or 336-641-4981   ° °Mobile Crisis Teams °Organization         Address  Phone  Notes  °[facility], Mobile Crisis Care Unit  1-877-626-1772   °Assertive °[facility] ° [address]  [place], [place] 336-834-9664   °Sharon DeEsch [address] °[place] 336-554-5454   ° °Self-Help/Support Groups °Organization         Address  Phone             Notes  °[facility] of  - variety of support groups  336- 373-1402 Call for more information  °Narcotics Anonymous (NA), [facility] [address], °[place]  2 meetings at this location  ° °  Residential Treatment Programs Organization         Address  Phone  Notes  [facility] [address],    [place]  [phone]   [facility]  [address], [place] [phone], [place], [place] [phone]   [facility] [address], [place] [phone] Admissions: 8am-3pm M-F  [facility] [address]    [place], [place] [phone]   [facility] [address], [place], [place] [phone]   [facility] [address]  [place], [place] [phone]   [facility] Intensive Outpatient [address], [place], [place] [phone]   [facility] ([facility] [address]  [place], [place] [phone] or [phone]   [facility] ([facility]) [address] [place], [place] [phone] Accepts Medicaid  [facility] [address] [place] [address]  [place] [phone] Substance Abuse/Addiction Treatment   [facility]         Address  Phone  Notes  [facility]  332-375-3327(888) [phone]   Angie FavaJulie Brannon, PhD [address] [place], [place]   ([phone]) [phone](336) (440)038-2575 or [phone](336) [phone]   [facility]   [address] [place], [place] [phone](336) [phone]   [facility] [address], [place], [place] [phone](336) [phone] Insurance/Medicaid/sponsorship through [facility] [address]                                    [place], [place] [phone](336) ([phone] Therapy/tele-psych/case    [facility] [address]   [place], [place] [phone](336) [phone]    Dr. Lolly MustacheArfeen  [phone](336) [phone]   [facility] [place]  [employer] [facility] 1) [address], [place] 2) [address], [place] 3)  [address], [place] [phone](336) [phone](336) [phone]  [phone](336) [phone]   [facility] Child Abuse Hotline 425-858-2000(336) [phone] or ([phone](336) [phone] (After Hours)       Take the prescription as directed.  Increase your fluid intake (ie:  Gatoraide) for the next few days, as discussed.  Eat a bland diet and advance to your regular diet slowly as you can tolerate it.   Avoid full strength juices, as well as milk and milk products until your diarrhea has resolved.   Call your regular medical or OB/GYN doctor today to schedule a follow up appointment within the next 2 days.  Return to the Emergency Department immediately if not improving (or even worsening) despite taking the medicines as prescribed, any black or bloody stool or vomit, if you develop a fever over "101," or for any other concerns.

## 2013-10-19 LAB — URINE CULTURE
Colony Count: NO GROWTH
Culture: NO GROWTH

## 2013-10-22 ENCOUNTER — Encounter: Payer: Self-pay | Admitting: Obstetrics & Gynecology

## 2013-10-22 ENCOUNTER — Ambulatory Visit (INDEPENDENT_AMBULATORY_CARE_PROVIDER_SITE_OTHER): Payer: Medicaid Other | Admitting: Obstetrics & Gynecology

## 2013-10-22 VITALS — BP 100/80 | Wt 172.0 lb

## 2013-10-22 DIAGNOSIS — Z3480 Encounter for supervision of other normal pregnancy, unspecified trimester: Secondary | ICD-10-CM

## 2013-10-22 DIAGNOSIS — O99340 Other mental disorders complicating pregnancy, unspecified trimester: Secondary | ICD-10-CM

## 2013-10-22 DIAGNOSIS — O09299 Supervision of pregnancy with other poor reproductive or obstetric history, unspecified trimester: Secondary | ICD-10-CM

## 2013-10-22 NOTE — Addendum Note (Signed)
Addended by: Richardson ChiquitoRAVIS, Jiah Bari M on: 10/22/2013 04:31 PM   Modules accepted: Orders

## 2013-10-22 NOTE — Progress Notes (Signed)
BP weight and urine results all reviewed and noted. Patient reports good fetal movement, denies any bleeding and no rupture of membranes symptoms or regular contractions. Patient is without complaints. All questions were answered.  

## 2013-10-23 ENCOUNTER — Encounter: Payer: Self-pay | Admitting: Women's Health

## 2013-10-25 LAB — MATERNAL SCREEN, INTEGRATED #2
AFP MOM MAT SCREEN: 0.9
AFP, Serum: 32.3 ng/mL
Age risk Down Syndrome: 1:1000 {titer}
CALCULATED GESTATIONAL AGE MAT SCREEN: 16.4
Crown Rump Length: 61.1 mm
Estriol Mom: 1.03
Estriol, Free: 0.89 ng/mL
Inhibin A Dimeric: 107 pg/mL
Inhibin A MoM: 0.69
NT MoM: 1.1
NUMBER OF FETUSES MAT SCREEN 2: 1
Nuchal Translucency: 1.54 mm
PAPP-A MOM MAT SCREEN: 0.49
PAPP-A: 534 ng/mL
Rish for ONTD: <1:5000 {titer}
hCG MoM: 0.4
hCG, Serum: 14.4 IU/mL

## 2013-11-19 ENCOUNTER — Other Ambulatory Visit: Payer: Self-pay | Admitting: Obstetrics & Gynecology

## 2013-11-19 ENCOUNTER — Ambulatory Visit (INDEPENDENT_AMBULATORY_CARE_PROVIDER_SITE_OTHER): Payer: Medicaid Other | Admitting: Women's Health

## 2013-11-19 ENCOUNTER — Ambulatory Visit (INDEPENDENT_AMBULATORY_CARE_PROVIDER_SITE_OTHER): Payer: Medicaid Other

## 2013-11-19 ENCOUNTER — Encounter: Payer: Self-pay | Admitting: Women's Health

## 2013-11-19 VITALS — BP 110/60 | Wt 175.0 lb

## 2013-11-19 DIAGNOSIS — Z3480 Encounter for supervision of other normal pregnancy, unspecified trimester: Secondary | ICD-10-CM

## 2013-11-19 DIAGNOSIS — Z331 Pregnant state, incidental: Secondary | ICD-10-CM

## 2013-11-19 DIAGNOSIS — O09299 Supervision of pregnancy with other poor reproductive or obstetric history, unspecified trimester: Secondary | ICD-10-CM

## 2013-11-19 DIAGNOSIS — Z1389 Encounter for screening for other disorder: Secondary | ICD-10-CM

## 2013-11-19 DIAGNOSIS — O99330 Smoking (tobacco) complicating pregnancy, unspecified trimester: Secondary | ICD-10-CM

## 2013-11-19 DIAGNOSIS — F17200 Nicotine dependence, unspecified, uncomplicated: Secondary | ICD-10-CM

## 2013-11-19 DIAGNOSIS — O99340 Other mental disorders complicating pregnancy, unspecified trimester: Secondary | ICD-10-CM

## 2013-11-19 LAB — POCT URINALYSIS DIPSTICK
Blood, UA: NEGATIVE
Glucose, UA: NEGATIVE
Ketones, UA: NEGATIVE
Leukocytes, UA: NEGATIVE
Nitrite, UA: NEGATIVE
PROTEIN UA: NEGATIVE

## 2013-11-19 NOTE — Progress Notes (Signed)
U/S(20+1wks)-single active fetus, meas c/w dates, fluid wnl, high anterior Gr 0 placenta, cx appears closed(3.4cm), bilateral adnexa appears WNL, FHR- 156 bpm, female fetus, no major abnl noted

## 2013-11-19 NOTE — Progress Notes (Signed)
Reports good fm. Denies uc's, lof, vb, uti s/s.  Nipples sore and occ leaks milk. Can try Lanolin if needed, shower w/ back to water. States n/v MUCH better, has gained 3lbs since last visit! Reviewed today's u/s, ptl s/s.  All questions answered. F/U in 4wks for visit.

## 2013-11-19 NOTE — Patient Instructions (Signed)
Second Trimester of Pregnancy The second trimester is from week 13 through week 28, months 4 through 6. The second trimester is often a time when you feel your best. Your body has also adjusted to being pregnant, and you begin to feel better physically. Usually, morning sickness has lessened or quit completely, you may have more energy, and you may have an increase in appetite. The second trimester is also a time when the fetus is growing rapidly. At the end of the sixth month, the fetus is about 9 inches long and weighs about 1 pounds. You will likely begin to feel the baby move (quickening) between 18 and 20 weeks of the pregnancy. BODY CHANGES Your body goes through many changes during pregnancy. The changes vary from woman to woman.   Your weight will continue to increase. You will notice your lower abdomen bulging out.  You may begin to get stretch marks on your hips, abdomen, and breasts.  You may develop headaches that can be relieved by medicines approved by your caregiver.  You may urinate more often because the fetus is pressing on your bladder.  You may develop or continue to have heartburn as a result of your pregnancy.  You may develop constipation because certain hormones are causing the muscles that push waste through your intestines to slow down.  You may develop hemorrhoids or swollen, bulging veins (varicose veins).  You may have back pain because of the weight gain and pregnancy hormones relaxing your joints between the bones in your pelvis and as a result of a shift in weight and the muscles that support your balance.  Your breasts will continue to grow and be tender.  Your gums may bleed and may be sensitive to brushing and flossing.  Dark spots or blotches (chloasma, mask of pregnancy) may develop on your face. This will likely fade after the baby is born.  A dark line from your belly button to the pubic area (linea nigra) may appear. This will likely fade after the  baby is born. WHAT TO EXPECT AT YOUR PRENATAL VISITS During a routine prenatal visit:  You will be weighed to make sure you and the fetus are growing normally.  Your blood pressure will be taken.  Your abdomen will be measured to track your baby's growth.  The fetal heartbeat will be listened to.  Any test results from the previous visit will be discussed. Your caregiver may ask you:  How you are feeling.  If you are feeling the baby move.  If you have had any abnormal symptoms, such as leaking fluid, bleeding, severe headaches, or abdominal cramping.  If you have any questions. Other tests that may be performed during your second trimester include:  Blood tests that check for:  Low iron levels (anemia).  Gestational diabetes (between 24 and 28 weeks).  Rh antibodies.  Urine tests to check for infections, diabetes, or protein in the urine.  An ultrasound to confirm the proper growth and development of the baby.  An amniocentesis to check for possible genetic problems.  Fetal screens for spina bifida and Down syndrome. HOME CARE INSTRUCTIONS   Avoid all smoking, herbs, alcohol, and unprescribed drugs. These chemicals affect the formation and growth of the baby.  Follow your caregiver's instructions regarding medicine use. There are medicines that are either safe or unsafe to take during pregnancy.  Exercise only as directed by your caregiver. Experiencing uterine cramps is a good sign to stop exercising.  Continue to eat regular,   healthy meals.  Wear a good support bra for breast tenderness.  Do not use hot tubs, steam rooms, or saunas.  Wear your seat belt at all times when driving.  Avoid raw meat, uncooked cheese, cat litter boxes, and soil used by cats. These carry germs that can cause birth defects in the baby.  Take your prenatal vitamins.  Try taking a stool softener (if your caregiver approves) if you develop constipation. Eat more high-fiber foods,  such as fresh vegetables or fruit and whole grains. Drink plenty of fluids to keep your urine clear or pale yellow.  Take warm sitz baths to soothe any pain or discomfort caused by hemorrhoids. Use hemorrhoid cream if your caregiver approves.  If you develop varicose veins, wear support hose. Elevate your feet for 15 minutes, 3 4 times a day. Limit salt in your diet.  Avoid heavy lifting, wear low heel shoes, and practice good posture.  Rest with your legs elevated if you have leg cramps or low back pain.  Visit your dentist if you have not gone yet during your pregnancy. Use a soft toothbrush to brush your teeth and be gentle when you floss.  A sexual relationship may be continued unless your caregiver directs you otherwise.  Continue to go to all your prenatal visits as directed by your caregiver. SEEK MEDICAL CARE IF:   You have dizziness.  You have mild pelvic cramps, pelvic pressure, or nagging pain in the abdominal area.  You have persistent nausea, vomiting, or diarrhea.  You have a bad smelling vaginal discharge.  You have pain with urination. SEEK IMMEDIATE MEDICAL CARE IF:   You have a fever.  You are leaking fluid from your vagina.  You have spotting or bleeding from your vagina.  You have severe abdominal cramping or pain.  You have rapid weight gain or loss.  You have shortness of breath with chest pain.  You notice sudden or extreme swelling of your face, hands, ankles, feet, or legs.  You have not felt your baby move in over an hour.  You have severe headaches that do not go away with medicine.  You have vision changes. Document Released: 07/20/2001 Document Revised: 03/28/2013 Document Reviewed: 09/26/2012 ExitCare Patient Information 2014 [employer].  

## 2013-12-17 ENCOUNTER — Ambulatory Visit (INDEPENDENT_AMBULATORY_CARE_PROVIDER_SITE_OTHER): Payer: Medicaid Other | Admitting: Adult Health

## 2013-12-17 ENCOUNTER — Encounter: Payer: Self-pay | Admitting: Adult Health

## 2013-12-17 VITALS — BP 104/60 | Wt 177.0 lb

## 2013-12-17 DIAGNOSIS — N898 Other specified noninflammatory disorders of vagina: Secondary | ICD-10-CM | POA: Insufficient documentation

## 2013-12-17 DIAGNOSIS — N760 Acute vaginitis: Secondary | ICD-10-CM

## 2013-12-17 DIAGNOSIS — Z331 Pregnant state, incidental: Secondary | ICD-10-CM

## 2013-12-17 DIAGNOSIS — B9689 Other specified bacterial agents as the cause of diseases classified elsewhere: Secondary | ICD-10-CM

## 2013-12-17 DIAGNOSIS — Z1389 Encounter for screening for other disorder: Secondary | ICD-10-CM

## 2013-12-17 DIAGNOSIS — O2390 Unspecified genitourinary tract infection in pregnancy, unspecified trimester: Secondary | ICD-10-CM

## 2013-12-17 HISTORY — DX: Other specified bacterial agents as the cause of diseases classified elsewhere: B96.89

## 2013-12-17 HISTORY — DX: Other specified noninflammatory disorders of vagina: N89.8

## 2013-12-17 LAB — POCT URINALYSIS DIPSTICK
Blood, UA: NEGATIVE
Glucose, UA: NEGATIVE
KETONES UA: NEGATIVE
LEUKOCYTES UA: NEGATIVE
Nitrite, UA: NEGATIVE

## 2013-12-17 LAB — POCT WET PREP (WET MOUNT): WBC WET PREP: POSITIVE

## 2013-12-17 MED ORDER — METRONIDAZOLE 0.75 % VA GEL: 1.0000 | Freq: Two times a day (BID) | VAGINAL | Status: DC

## 2013-12-17 NOTE — Progress Notes (Signed)
Complains of vaginal discharge and itches feels swollen, has watery discharge and red side walls,wet prep +WBC and clue cells will rx metrogel and check GC/CHL, has numbness 4 and 5 th fingers both hands try to decrease salt and rest hands. Return in 4 weeks for PN2

## 2013-12-17 NOTE — Progress Notes (Signed)
Pt states that she has had some numbness in her pinky and ring finger on both hands. Pt states that she has a discharge, more after she has sex and some itching as well.

## 2013-12-17 NOTE — Patient Instructions (Signed)
Third Trimester of Pregnancy The third trimester is from week 29 through week 42, months 7 through 9. The third trimester is a time when the fetus is growing rapidly. At the end of the ninth month, the fetus is about 20 inches in length and weighs 6 10 pounds.  BODY CHANGES Your body goes through many changes during pregnancy. The changes vary from woman to woman.   Your weight will continue to increase. You can expect to gain 25 35 pounds (11 16 kg) by the end of the pregnancy.  You may begin to get stretch marks on your hips, abdomen, and breasts.  You may urinate more often because the fetus is moving lower into your pelvis and pressing on your bladder.  You may develop or continue to have heartburn as a result of your pregnancy.  You may develop constipation because certain hormones are causing the muscles that push waste through your intestines to slow down.  You may develop hemorrhoids or swollen, bulging veins (varicose veins).  You may have pelvic pain because of the weight gain and pregnancy hormones relaxing your joints between the bones in your pelvis. Back aches may result from over exertion of the muscles supporting your posture.  Your breasts will continue to grow and be tender. A yellow discharge may leak from your breasts called colostrum.  Your belly button may stick out.  You may feel short of breath because of your expanding uterus.  You may notice the fetus "dropping," or moving lower in your abdomen.  You may have a bloody mucus discharge. This usually occurs a few days to a week before labor begins.  Your cervix becomes thin and soft (effaced) near your due date. WHAT TO EXPECT AT YOUR PRENATAL EXAMS  You will have prenatal exams every 2 weeks until week 36. Then, you will have weekly prenatal exams. During a routine prenatal visit:  You will be weighed to make sure you and the fetus are growing normally.  Your blood pressure is taken.  Your abdomen will be  measured to track your baby's growth.  The fetal heartbeat will be listened to.  Any test results from the previous visit will be discussed.  You may have a cervical check near your due date to see if you have effaced. At around 36 weeks, your caregiver will check your cervix. At the same time, your caregiver will also perform a test on the secretions of the vaginal tissue. This test is to determine if a type of bacteria, Group B streptococcus, is present. Your caregiver will explain this further. Your caregiver may ask you:  What your birth plan is.  How you are feeling.  If you are feeling the baby move.  If you have had any abnormal symptoms, such as leaking fluid, bleeding, severe headaches, or abdominal cramping.  If you have any questions. Other tests or screenings that may be performed during your third trimester include:  Blood tests that check for low iron levels (anemia).  Fetal testing to check the health, activity level, and growth of the fetus. Testing is done if you have certain medical conditions or if there are problems during the pregnancy. FALSE LABOR You may feel small, irregular contractions that eventually go away. These are called Braxton Hicks contractions, or false labor. Contractions may last for hours, days, or even weeks before true labor sets in. If contractions come at regular intervals, intensify, or become painful, it is best to be seen by your caregiver.  SIGNS OF LABOR   Menstrual-like cramps.  Contractions that are 5 minutes apart or less.  Contractions that start on the top of the uterus and spread down to the lower abdomen and back.  A sense of increased pelvic pressure or back pain.  A watery or bloody mucus discharge that comes from the vagina. If you have any of these signs before the 37th week of pregnancy, call your caregiver right away. You need to go to the hospital to get checked immediately. HOME CARE INSTRUCTIONS   Avoid all  smoking, herbs, alcohol, and unprescribed drugs. These chemicals affect the formation and growth of the baby.  Follow your caregiver's instructions regarding medicine use. There are medicines that are either safe or unsafe to take during pregnancy.  Exercise only as directed by your caregiver. Experiencing uterine cramps is a good sign to stop exercising.  Continue to eat regular, healthy meals.  Wear a good support bra for breast tenderness.  Do not use hot tubs, steam rooms, or saunas.  Wear your seat belt at all times when driving.  Avoid raw meat, uncooked cheese, cat litter boxes, and soil used by cats. These carry germs that can cause birth defects in the baby.  Take your prenatal vitamins.  Try taking a stool softener (if your caregiver approves) if you develop constipation. Eat more high-fiber foods, such as fresh vegetables or fruit and whole grains. Drink plenty of fluids to keep your urine clear or pale yellow.  Take warm sitz baths to soothe any pain or discomfort caused by hemorrhoids. Use hemorrhoid cream if your caregiver approves.  If you develop varicose veins, wear support hose. Elevate your feet for 15 minutes, 3 4 times a day. Limit salt in your diet.  Avoid heavy lifting, wear low heal shoes, and practice good posture.  Rest a lot with your legs elevated if you have leg cramps or low back pain.  Visit your dentist if you have not gone during your pregnancy. Use a soft toothbrush to brush your teeth and be gentle when you floss.  A sexual relationship may be continued unless your caregiver directs you otherwise.  Do not travel far distances unless it is absolutely necessary and only with the approval of your caregiver.  Take prenatal classes to understand, practice, and ask questions about the labor and delivery.  Make a trial run to the hospital.  Pack your hospital bag.  Prepare the baby's nursery.  Continue to go to all your prenatal visits as directed  by your caregiver. SEEK MEDICAL CARE IF:  You are unsure if you are in labor or if your water has broken.  You have dizziness.  You have mild pelvic cramps, pelvic pressure, or nagging pain in your abdominal area.  You have persistent nausea, vomiting, or diarrhea.  You have a bad smelling vaginal discharge.  You have pain with urination. SEEK IMMEDIATE MEDICAL CARE IF:   You have a fever.  You are leaking fluid from your vagina.  You have spotting or bleeding from your vagina.  You have severe abdominal cramping or pain.  You have rapid weight loss or gain.  You have shortness of breath with chest pain.  You notice sudden or extreme swelling of your face, hands, ankles, feet, or legs.  You have not felt your baby move in over an hour.  You have severe headaches that do not go away with medicine.  You have vision changes. Document Released: 07/20/2001 Document Revised: 03/28/2013 Document Reviewed:   09/26/2012 ExitCare Patient Information 2014 [place]. Return in 4 weeks for PN2

## 2013-12-18 LAB — GC/CHLAMYDIA PROBE AMP
CT Probe RNA: NEGATIVE
GC Probe RNA: NEGATIVE

## 2014-01-14 ENCOUNTER — Other Ambulatory Visit: Payer: BC Managed Care – PPO

## 2014-01-17 ENCOUNTER — Encounter: Payer: Self-pay | Admitting: Advanced Practice Midwife

## 2014-01-17 ENCOUNTER — Other Ambulatory Visit: Payer: Medicaid Other

## 2014-01-17 ENCOUNTER — Ambulatory Visit (INDEPENDENT_AMBULATORY_CARE_PROVIDER_SITE_OTHER): Payer: Medicaid Other | Admitting: Advanced Practice Midwife

## 2014-01-17 VITALS — BP 120/62 | Wt 176.0 lb

## 2014-01-17 DIAGNOSIS — Z1389 Encounter for screening for other disorder: Secondary | ICD-10-CM

## 2014-01-17 DIAGNOSIS — Z331 Pregnant state, incidental: Secondary | ICD-10-CM

## 2014-01-17 DIAGNOSIS — Z3480 Encounter for supervision of other normal pregnancy, unspecified trimester: Secondary | ICD-10-CM

## 2014-01-17 LAB — CBC
HEMATOCRIT: 31.3 % — AB (ref 36.0–46.0)
Hemoglobin: 10.8 g/dL — ABNORMAL LOW (ref 12.0–15.0)
MCH: 29.9 pg (ref 26.0–34.0)
MCHC: 34.5 g/dL (ref 30.0–36.0)
MCV: 86.7 fL (ref 78.0–100.0)
Platelets: 214 10*3/uL (ref 150–400)
RBC: 3.61 MIL/uL — ABNORMAL LOW (ref 3.87–5.11)
RDW: 13.7 % (ref 11.5–15.5)
WBC: 6.3 10*3/uL (ref 4.0–10.5)

## 2014-01-17 LAB — POCT URINALYSIS DIPSTICK
Blood, UA: NEGATIVE
GLUCOSE UA: NEGATIVE
Ketones, UA: NEGATIVE
Leukocytes, UA: NEGATIVE
NITRITE UA: NEGATIVE

## 2014-01-17 MED ORDER — ONDANSETRON 4 MG PO TBDP: 4.0000 mg | ORAL_TABLET | Freq: Four times a day (QID) | ORAL | Status: DC | PRN

## 2014-01-17 NOTE — Progress Notes (Signed)
[id number] [redacted]w[redacted]d Estimated Date of Delivery: 04/07/14  Blood pressure 120/62, weight 176 lb (79.833 kg), last menstrual period 07/01/2013.   BP weight and urine results all reviewed and noted.  Please refer to the obstetrical flow sheet for the fundal height and fetal heart rate documentation:  Doing PN2 today.  Had a few BH ctx the other day.  S/S PTL discussed  Patient reports good fetal movement, denies any bleeding and no rupture of membranes symptoms or regular contractions. Patient is without complaints. All questions were answered.  Plan:  Continued routine obstetrical care,   Follow up in 3 weeks for OB appointment,

## 2014-01-18 LAB — GLUCOSE TOLERANCE, 2 HOURS W/ 1HR
Glucose, 1 hour: 116 mg/dL (ref 70–170)
Glucose, 2 hour: 120 mg/dL (ref 70–139)
Glucose, Fasting: 66 mg/dL — ABNORMAL LOW (ref 70–99)

## 2014-01-18 LAB — HSV 2 ANTIBODY, IGG: HSV 2 Glycoprotein G Ab, IgG: <0.1 IV

## 2014-01-18 LAB — RPR

## 2014-01-18 LAB — ANTIBODY SCREEN: Antibody Screen: NEGATIVE

## 2014-01-18 LAB — HIV ANTIBODY (ROUTINE TESTING W REFLEX): HIV 1&2 Ab, 4th Generation: NONREACTIVE

## 2014-01-19 ENCOUNTER — Encounter: Payer: Self-pay | Admitting: Advanced Practice Midwife

## 2014-02-07 ENCOUNTER — Encounter: Payer: Self-pay | Admitting: Obstetrics & Gynecology

## 2014-02-07 ENCOUNTER — Ambulatory Visit (INDEPENDENT_AMBULATORY_CARE_PROVIDER_SITE_OTHER): Payer: Medicaid Other | Admitting: Obstetrics & Gynecology

## 2014-02-07 VITALS — BP 110/60 | Wt 175.0 lb

## 2014-02-07 DIAGNOSIS — Z1389 Encounter for screening for other disorder: Secondary | ICD-10-CM

## 2014-02-07 DIAGNOSIS — Z3480 Encounter for supervision of other normal pregnancy, unspecified trimester: Secondary | ICD-10-CM

## 2014-02-07 DIAGNOSIS — Z331 Pregnant state, incidental: Secondary | ICD-10-CM

## 2014-02-07 LAB — POCT URINALYSIS DIPSTICK
Glucose, UA: NEGATIVE
Ketones, UA: NEGATIVE
Nitrite, UA: NEGATIVE

## 2014-02-07 MED ORDER — ACYCLOVIR 400 MG PO TABS: 400.0000 mg | ORAL_TABLET | Freq: Three times a day (TID) | ORAL | Status: DC

## 2014-02-07 NOTE — Progress Notes (Signed)
[id number] Estimated Date of Delivery: 04/07/14  Blood pressure 110/60, weight 175 lb (79.379 kg), last menstrual period 07/01/2013.   BP weight and urine results all reviewed and noted.  Please refer to the obstetrical flow sheet for the fundal height and fetal heart rate documentation:  Patient reports good fetal movement, denies any bleeding and no rupture of membranes symptoms or regular contractions. Patient is without complaints. All questions were answered.  Plan:  Continued routine obstetrical care,   Follow up in 2 weeks for OB appointment,   Acyclovir for a cold sore

## 2014-02-21 ENCOUNTER — Ambulatory Visit (INDEPENDENT_AMBULATORY_CARE_PROVIDER_SITE_OTHER): Payer: Medicaid Other | Admitting: Advanced Practice Midwife

## 2014-02-21 VITALS — BP 118/64 | Wt 173.0 lb

## 2014-02-21 DIAGNOSIS — Z331 Pregnant state, incidental: Secondary | ICD-10-CM

## 2014-02-21 DIAGNOSIS — Z1389 Encounter for screening for other disorder: Secondary | ICD-10-CM

## 2014-02-21 DIAGNOSIS — Z3483 Encounter for supervision of other normal pregnancy, third trimester: Secondary | ICD-10-CM

## 2014-02-21 DIAGNOSIS — Z3480 Encounter for supervision of other normal pregnancy, unspecified trimester: Secondary | ICD-10-CM

## 2014-02-21 LAB — POCT URINALYSIS DIPSTICK
Blood, UA: 1
Glucose, UA: NEGATIVE
KETONES UA: NEGATIVE
Nitrite, UA: NEGATIVE
Protein, UA: 1

## 2014-02-21 MED ORDER — BUTALBITAL-ASPIRIN-CAFFEINE 50-325-40 MG PO CAPS: 1.0000 | ORAL_CAPSULE | ORAL | Status: DC | PRN

## 2014-02-21 MED ORDER — BUTALBITAL-APAP-CAFFEINE 50-325-40 MG PO TABS: 1.0000 | ORAL_TABLET | Freq: Four times a day (QID) | ORAL | Status: DC | PRN

## 2014-02-21 NOTE — Progress Notes (Addendum)
[id number] 7435w4d Estimated Date of Delivery: 04/07/14  Blood pressure 118/64, weight 173 lb (78.472 kg), last menstrual period 07/01/2013.   BP weight and urine results all reviewed and noted. No sx of UIT, will culture  Please refer to the obstetrical flow sheet for the fundal height and fetal heart rate documentation:  Patient reports good fetal movement, denies any bleeding and no rupture of membranes symptoms or regular contractions. Patient c/o HA yesterday that was not relieved with Tylenol.  Bilateral frontal tension type All questions were answered.  Plan:  Continued routine obstetrical care, Fiorecet prn  Follow up in 2 weeks for OB appointment,

## 2014-02-23 LAB — URINE CULTURE: Colony Count: >=100000

## 2014-03-07 ENCOUNTER — Ambulatory Visit (INDEPENDENT_AMBULATORY_CARE_PROVIDER_SITE_OTHER): Payer: Medicaid Other | Admitting: Advanced Practice Midwife

## 2014-03-07 VITALS — BP 138/92 | Wt 172.0 lb

## 2014-03-07 DIAGNOSIS — I159 Secondary hypertension, unspecified: Secondary | ICD-10-CM

## 2014-03-07 DIAGNOSIS — Z331 Pregnant state, incidental: Secondary | ICD-10-CM

## 2014-03-07 DIAGNOSIS — Z3480 Encounter for supervision of other normal pregnancy, unspecified trimester: Secondary | ICD-10-CM

## 2014-03-07 DIAGNOSIS — Z3483 Encounter for supervision of other normal pregnancy, third trimester: Secondary | ICD-10-CM

## 2014-03-07 DIAGNOSIS — Z1389 Encounter for screening for other disorder: Secondary | ICD-10-CM

## 2014-03-07 LAB — COMPREHENSIVE METABOLIC PANEL
ALK PHOS: 126 U/L — AB (ref 39–117)
ALT: <8 U/L (ref 0–35)
AST: 12 U/L (ref 0–37)
Albumin: 3.1 g/dL — ABNORMAL LOW (ref 3.5–5.2)
BILIRUBIN TOTAL: 0.4 mg/dL (ref 0.2–1.2)
BUN: 5 mg/dL — ABNORMAL LOW (ref 6–23)
CO2: 23 mEq/L (ref 19–32)
Calcium: 8.1 mg/dL — ABNORMAL LOW (ref 8.4–10.5)
Chloride: 106 mEq/L (ref 96–112)
Creat: 0.66 mg/dL (ref 0.50–1.10)
GLUCOSE: 93 mg/dL (ref 70–99)
POTASSIUM: 3.1 meq/L — AB (ref 3.5–5.3)
Sodium: 137 mEq/L (ref 135–145)
Total Protein: 6.1 g/dL (ref 6.0–8.3)

## 2014-03-07 LAB — CBC
HCT: 29.9 % — ABNORMAL LOW (ref 36.0–46.0)
Hemoglobin: 10.3 g/dL — ABNORMAL LOW (ref 12.0–15.0)
MCH: 29 pg (ref 26.0–34.0)
MCHC: 34.4 g/dL (ref 30.0–36.0)
MCV: 84.2 fL (ref 78.0–100.0)
PLATELETS: 219 10*3/uL (ref 150–400)
RBC: 3.55 MIL/uL — ABNORMAL LOW (ref 3.87–5.11)
RDW: 13.1 % (ref 11.5–15.5)
WBC: 6.2 10*3/uL (ref 4.0–10.5)

## 2014-03-07 LAB — POCT URINALYSIS DIPSTICK
Blood, UA: NEGATIVE
GLUCOSE UA: NEGATIVE
Ketones, UA: NEGATIVE
LEUKOCYTES UA: NEGATIVE
Nitrite, UA: NEGATIVE
Protein, UA: 1

## 2014-03-07 NOTE — Patient Instructions (Signed)

## 2014-03-07 NOTE — Progress Notes (Signed)
[id number] 3911w4d Estimated Date of Delivery: 04/07/14  Last menstrual period 07/01/2013.   BP weight and urine results all reviewed and noted.  Please refer to the obstetrical flow sheet for the fundal height and fetal heart rate documentation:  Patient reports good fetal movement, denies any bleeding and no rupture of membranes symptoms or regular contractions. Patient is without complaints. Denies HA, vision changes, occ RUQ ppain  All questions were answered.  Plan: B/P check Monday; PreX labs today.  PreX precautions  Follow up Monday

## 2014-03-08 LAB — PROTEIN / CREATININE RATIO, URINE
CREATININE, URINE: 283.1 mg/dL
PROTEIN CREATININE RATIO: 0.36 — AB (ref <0.15)
TOTAL PROTEIN, URINE: 102 mg/dL

## 2014-03-11 ENCOUNTER — Ambulatory Visit (INDEPENDENT_AMBULATORY_CARE_PROVIDER_SITE_OTHER): Payer: Medicaid Other | Admitting: Women's Health

## 2014-03-11 ENCOUNTER — Encounter: Payer: Self-pay | Admitting: Women's Health

## 2014-03-11 VITALS — BP 118/58 | Wt 176.0 lb

## 2014-03-11 DIAGNOSIS — Z1389 Encounter for screening for other disorder: Secondary | ICD-10-CM

## 2014-03-11 DIAGNOSIS — Z3480 Encounter for supervision of other normal pregnancy, unspecified trimester: Secondary | ICD-10-CM

## 2014-03-11 DIAGNOSIS — Z3483 Encounter for supervision of other normal pregnancy, third trimester: Secondary | ICD-10-CM

## 2014-03-11 DIAGNOSIS — Z331 Pregnant state, incidental: Secondary | ICD-10-CM

## 2014-03-11 LAB — POCT URINALYSIS DIPSTICK
Glucose, UA: NEGATIVE
KETONES UA: NEGATIVE
Leukocytes, UA: NEGATIVE
Nitrite, UA: NEGATIVE
RBC UA: NEGATIVE

## 2014-03-11 NOTE — Patient Instructions (Signed)
Call the office ([phone]) or [facility] for these signs of pre-eclampsia:  Severe headache that does not go away with Tylenol  Visual changes- seeing spots, double, blurred vision  Pain under your right breast or upper abdomen that does not go away with Tums or heartburn medicine  Nausea and/or vomiting  Severe swelling in your hands, feet, and face    Call the office ([phone]) or go to [facility] if:  You begin to have strong, frequent contractions  Your water breaks.  Sometimes it is a big gush of fluid, sometimes it is just a trickle that keeps getting your panties wet or running down your legs  You have vaginal bleeding.  It is normal to have a small amount of spotting if your cervix was checked.   You don't feel your baby moving like normal.  If you don't, get you something to eat and drink and lay down and focus on feeling your baby move.  You should feel at least 10 movements in 2 hours.  If you don't, you should call the office or go to [facility].    Preterm Labor Information Preterm labor is when labor starts at less than 37 weeks of pregnancy. The normal length of a pregnancy is 39 to 41 weeks. CAUSES Often, there is no identifiable underlying cause as to why a woman goes into preterm labor. One of the most common known causes of preterm labor is infection. Infections of the uterus, cervix, vagina, amniotic sac, bladder, kidney, or even the lungs (pneumonia) can cause labor to start. Other suspected causes of preterm labor include:   Urogenital infections, such as yeast infections and bacterial vaginosis.   Uterine abnormalities (uterine shape, uterine septum, fibroids, or bleeding from the placenta).   A cervix that has been operated on (it may fail to stay closed).   Malformations in the fetus.   Multiple gestations (twins, triplets, and so on).   Breakage of the amniotic sac.  RISK FACTORS  Having a previous history of preterm  labor.   Having premature rupture of membranes (PROM).   Having a placenta that covers the opening of the cervix (placenta previa).   Having a placenta that separates from the uterus (placental abruption).   Having a cervix that is too weak to hold the fetus in the uterus (incompetent cervix).   Having too much fluid in the amniotic sac (polyhydramnios).   Taking illegal drugs or smoking while pregnant.   Not gaining enough weight while pregnant.   Being younger than 18 and older than 25 years old.   Having a low socioeconomic status.   Being African American. SYMPTOMS Signs and symptoms of preterm labor include:   Menstrual-like cramps, abdominal pain, or back pain.  Uterine contractions that are regular, as frequent as six in an hour, regardless of their intensity (may be mild or painful).  Contractions that start on the top of the uterus and spread down to the lower abdomen and back.   A sense of increased pelvic pressure.   A watery or bloody mucus discharge that comes from the vagina.  TREATMENT Depending on the length of the pregnancy and other circumstances, your health care provider may suggest bed rest. If necessary, there are medicines that can be given to stop contractions and to mature the fetal lungs. If labor happens before 34 weeks of pregnancy, a prolonged hospital stay may be recommended. Treatment depends on the condition of both you and the fetus.    WHAT SHOULD YOU DO IF YOU THINK YOU ARE IN PRETERM LABOR? Call your health care provider right away. You will need to go to the hospital to get checked immediately. HOW CAN YOU PREVENT PRETERM LABOR IN FUTURE PREGNANCIES? You should:   Stop smoking if you smoke.  Maintain healthy weight gain and avoid chemicals and drugs that are not necessary.  Be watchful for any type of infection.  Inform your health care provider if you have a known history of preterm labor. Document Released: 10/16/2003  Document Revised: 03/28/2013 Document Reviewed: 08/28/2012 ExitCare Patient Information 2015 [employer]. This information is not intended to replace advice given to you by your health care provider. Make sure you discuss any questions you have with your health care provider.    

## 2014-03-11 NOTE — Progress Notes (Signed)
Low-risk OB appointment G3P1011 4248w1d Estimated Date of Delivery: 04/07/14 BP 118/58  Wt 176 lb (79.833 kg)  LMP 07/01/2013  BP, weight, and urine reviewed.  Refer to obstetrical flow sheet for FH & FHR.  Reports good fm.  Denies regular uc's, lof, vb, or uti s/s. No complaints. Here for bp check. Pre-e labs were normal, P:C ratio 0.36, however she has had trace to 1+ proteinuria entire pregnancy. Has been checking bp's at work since [employer], all normal except one: 146/94 on 7/31. Denies ha, scotomata, ruq/epigastric pain, n/v.  No pre-e or bp problems 1st pregnancy, this is a new FOB.  1+ BLE edema, 2+ DTRs, no clonus Reviewed pre-e warning s/s, ptl s/s, fkc. Plan:  Continue routine obstetrical care, continue taking bp's at work- if consistently >=140 and/or 90 to call us F/U in 3d for OB appointment & GBS Discussed w/ LHE who agrees w/ poc

## 2014-03-14 ENCOUNTER — Ambulatory Visit (INDEPENDENT_AMBULATORY_CARE_PROVIDER_SITE_OTHER): Payer: Medicaid Other | Admitting: Obstetrics & Gynecology

## 2014-03-14 VITALS — BP 140/80 | Wt 176.0 lb

## 2014-03-14 DIAGNOSIS — Z331 Pregnant state, incidental: Secondary | ICD-10-CM

## 2014-03-14 DIAGNOSIS — Z3685 Encounter for antenatal screening for Streptococcus B: Secondary | ICD-10-CM

## 2014-03-14 DIAGNOSIS — Z3480 Encounter for supervision of other normal pregnancy, unspecified trimester: Secondary | ICD-10-CM

## 2014-03-14 DIAGNOSIS — Z1159 Encounter for screening for other viral diseases: Secondary | ICD-10-CM

## 2014-03-14 DIAGNOSIS — Z1389 Encounter for screening for other disorder: Secondary | ICD-10-CM

## 2014-03-14 DIAGNOSIS — Z3483 Encounter for supervision of other normal pregnancy, third trimester: Secondary | ICD-10-CM

## 2014-03-14 LAB — POCT URINALYSIS DIPSTICK
Blood, UA: NEGATIVE
Glucose, UA: NEGATIVE
Ketones, UA: NEGATIVE
Leukocytes, UA: NEGATIVE
Nitrite, UA: NEGATIVE
Protein, UA: 1

## 2014-03-14 NOTE — Progress Notes (Signed)
BP still Ok, her BP from home are all good Continue to watch closely for now  G3P1011 6324w4d Estimated Date of Delivery: 04/07/14  Blood pressure 140/80, weight 176 lb (79.833 kg), last menstrual period 07/01/2013.   BP weight and urine results all reviewed and noted.  Please refer to the obstetrical flow sheet for the fundal height and fetal heart rate documentation:  Patient reports good fetal movement, denies any bleeding and no rupture of membranes symptoms or regular contractions. Patient is without complaints. All questions were answered.  Plan:  Continued routine obstetrical care,   Follow up in 1 weeks for OB appointment, routine

## 2014-03-15 LAB — STREP B DNA PROBE: STREP GROUP B AG: NOT DETECTED

## 2014-03-15 LAB — GC/CHLAMYDIA PROBE AMP
CT PROBE, AMP APTIMA: NEGATIVE
GC Probe RNA: NEGATIVE

## 2014-03-21 ENCOUNTER — Encounter: Payer: Self-pay | Admitting: Advanced Practice Midwife

## 2014-03-21 ENCOUNTER — Encounter: Payer: Medicaid Other | Admitting: Obstetrics & Gynecology

## 2014-03-21 ENCOUNTER — Ambulatory Visit (INDEPENDENT_AMBULATORY_CARE_PROVIDER_SITE_OTHER): Payer: Medicaid Other | Admitting: Advanced Practice Midwife

## 2014-03-21 VITALS — BP 128/80 | Wt 175.0 lb

## 2014-03-21 DIAGNOSIS — Z1389 Encounter for screening for other disorder: Secondary | ICD-10-CM

## 2014-03-21 DIAGNOSIS — Z029 Encounter for administrative examinations, unspecified: Secondary | ICD-10-CM

## 2014-03-21 DIAGNOSIS — Z331 Pregnant state, incidental: Secondary | ICD-10-CM

## 2014-03-21 DIAGNOSIS — Z3480 Encounter for supervision of other normal pregnancy, unspecified trimester: Secondary | ICD-10-CM

## 2014-03-21 DIAGNOSIS — Z3483 Encounter for supervision of other normal pregnancy, third trimester: Secondary | ICD-10-CM

## 2014-03-21 LAB — POCT URINALYSIS DIPSTICK
Glucose, UA: NEGATIVE
KETONES UA: NEGATIVE
Nitrite, UA: NEGATIVE
Protein, UA: NEGATIVE
RBC UA: NEGATIVE

## 2014-03-21 NOTE — Progress Notes (Signed)
[id number] 4261w4d Estimated Date of Delivery: 04/07/14  Last menstrual period 07/01/2013.   BP weight and urine results all reviewed and noted.  Please refer to the obstetrical flow sheet for the fundal height and fetal heart rate documentation:  Patient reports good fetal movement, denies any bleeding and no rupture of membranes symptoms or regular contractions. Patient is without complaints. All questions were answered.  Plan:  Continued routine obstetrical care,   Follow up in 1 weeks for OB appointment,

## 2014-03-28 ENCOUNTER — Encounter: Payer: Self-pay | Admitting: Obstetrics & Gynecology

## 2014-03-28 ENCOUNTER — Ambulatory Visit (INDEPENDENT_AMBULATORY_CARE_PROVIDER_SITE_OTHER): Payer: Medicaid Other | Admitting: Obstetrics & Gynecology

## 2014-03-28 VITALS — BP 130/80 | Wt 174.0 lb

## 2014-03-28 DIAGNOSIS — Z3480 Encounter for supervision of other normal pregnancy, unspecified trimester: Secondary | ICD-10-CM

## 2014-03-28 DIAGNOSIS — Z331 Pregnant state, incidental: Secondary | ICD-10-CM

## 2014-03-28 DIAGNOSIS — Z3483 Encounter for supervision of other normal pregnancy, third trimester: Secondary | ICD-10-CM

## 2014-03-28 DIAGNOSIS — Z1389 Encounter for screening for other disorder: Secondary | ICD-10-CM

## 2014-03-28 LAB — POCT URINALYSIS DIPSTICK
GLUCOSE UA: NEGATIVE
KETONES UA: NEGATIVE
Leukocytes, UA: NEGATIVE
Nitrite, UA: NEGATIVE
Protein, UA: NEGATIVE
RBC UA: NEGATIVE

## 2014-03-28 NOTE — Progress Notes (Signed)
[id number] 5555w4d Estimated Date of Delivery: 04/07/14  Blood pressure 130/80, weight 174 lb (78.926 kg), last menstrual period 07/01/2013.   BP weight and urine results all reviewed and noted.  Please refer to the obstetrical flow sheet for the fundal height and fetal heart rate documentation:  Patient reports good fetal movement, denies any bleeding and no rupture of membranes symptoms or regular contractions. Patient is without complaints. All questions were answered.  Plan:  Continued routine obstetrical care,   Follow up in 1  weeks for OB appointment, routine

## 2014-03-28 NOTE — Addendum Note (Signed)
Addended by: Richardson ChiquitoRAVIS, ASHLEY M on: 03/28/2014 04:23 PM   Modules accepted: Orders

## 2014-04-02 ENCOUNTER — Ambulatory Visit (INDEPENDENT_AMBULATORY_CARE_PROVIDER_SITE_OTHER): Payer: Medicaid Other | Admitting: Obstetrics & Gynecology

## 2014-04-02 ENCOUNTER — Inpatient Hospital Stay (HOSPITAL_COMMUNITY): Payer: Medicaid Other | Admitting: Anesthesiology

## 2014-04-02 ENCOUNTER — Encounter (HOSPITAL_COMMUNITY): Payer: Medicaid Other | Admitting: Anesthesiology

## 2014-04-02 ENCOUNTER — Inpatient Hospital Stay (HOSPITAL_COMMUNITY)
Admit: 2014-04-02 | Discharge: 2014-04-03 | DRG: 775 | Disposition: A | Discharge status: Home / Self Care | Payer: Medicaid Other | Source: Ambulatory Visit | Attending: Obstetrics & Gynecology | Admitting: Obstetrics & Gynecology

## 2014-04-02 ENCOUNTER — Encounter: Payer: Self-pay | Admitting: Obstetrics & Gynecology

## 2014-04-02 ENCOUNTER — Encounter (HOSPITAL_COMMUNITY): Payer: Self-pay | Admitting: *Deleted

## 2014-04-02 VITALS — BP 128/68 | Wt 175.0 lb

## 2014-04-02 DIAGNOSIS — Z331 Pregnant state, incidental: Secondary | ICD-10-CM

## 2014-04-02 DIAGNOSIS — Z833 Family history of diabetes mellitus: Secondary | ICD-10-CM | POA: Diagnosis not present

## 2014-04-02 DIAGNOSIS — Z1389 Encounter for screening for other disorder: Secondary | ICD-10-CM

## 2014-04-02 DIAGNOSIS — O99344 Other mental disorders complicating childbirth: Secondary | ICD-10-CM

## 2014-04-02 DIAGNOSIS — F411 Generalized anxiety disorder: Secondary | ICD-10-CM | POA: Diagnosis present

## 2014-04-02 DIAGNOSIS — O165 Unspecified maternal hypertension, complicating the puerperium: Secondary | ICD-10-CM

## 2014-04-02 DIAGNOSIS — O99334 Smoking (tobacco) complicating childbirth: Principal | ICD-10-CM | POA: Diagnosis present

## 2014-04-02 DIAGNOSIS — Z3480 Encounter for supervision of other normal pregnancy, unspecified trimester: Secondary | ICD-10-CM

## 2014-04-02 DIAGNOSIS — Z8249 Family history of ischemic heart disease and other diseases of the circulatory system: Secondary | ICD-10-CM | POA: Diagnosis not present

## 2014-04-02 DIAGNOSIS — O479 False labor, unspecified: Secondary | ICD-10-CM | POA: Diagnosis present

## 2014-04-02 DIAGNOSIS — IMO0001 Reserved for inherently not codable concepts without codable children: Secondary | ICD-10-CM

## 2014-04-02 DIAGNOSIS — Z3483 Encounter for supervision of other normal pregnancy, third trimester: Secondary | ICD-10-CM

## 2014-04-02 HISTORY — DX: Unspecified ovarian cyst, unspecified side: N83.209

## 2014-04-02 LAB — CBC
HCT: 32.5 % — ABNORMAL LOW (ref 36.0–46.0)
Hemoglobin: 11 g/dL — ABNORMAL LOW (ref 12.0–15.0)
MCH: 28.4 pg (ref 26.0–34.0)
MCHC: 33.8 g/dL (ref 30.0–36.0)
MCV: 84 fL (ref 78.0–100.0)
PLATELETS: 229 10*3/uL (ref 150–400)
RBC: 3.87 MIL/uL (ref 3.87–5.11)
RDW: 12.6 % (ref 11.5–15.5)
WBC: 8.6 10*3/uL (ref 4.0–10.5)

## 2014-04-02 LAB — POCT URINALYSIS DIPSTICK
Blood, UA: NEGATIVE
GLUCOSE UA: NEGATIVE
KETONES UA: NEGATIVE
Leukocytes, UA: NEGATIVE
Nitrite, UA: NEGATIVE

## 2014-04-02 LAB — OB RESULTS CONSOLE GC/CHLAMYDIA
CHLAMYDIA, DNA PROBE: NEGATIVE
Gonorrhea: NEGATIVE

## 2014-04-02 MED ORDER — PNEUMOCOCCAL VAC POLYVALENT 25 MCG/0.5ML IJ INJ
0.5000 mL | INJECTION | INTRAMUSCULAR | Status: DC
  Filled 2014-04-02: qty 0.5

## 2014-04-02 MED ORDER — TETANUS-DIPHTH-ACELL PERTUSSIS 5-2.5-18.5 LF-MCG/0.5 IM SUSP
0.5000 mL | Freq: Once | INTRAMUSCULAR | Status: AC
  Administered 2014-04-03: 0.5 mL via INTRAMUSCULAR
  Filled 2014-04-02 (×2): qty 0.5

## 2014-04-02 MED ORDER — LACTATED RINGERS IV SOLN
500.0000 mL | Freq: Once | INTRAVENOUS | Status: AC
  Administered 2014-04-02: 1000 mL via INTRAVENOUS

## 2014-04-02 MED ORDER — BENZOCAINE-MENTHOL 20-0.5 % EX AERO
1.0000 "application " | INHALATION_SPRAY | CUTANEOUS | Status: DC | PRN
  Administered 2014-04-03: 1 via TOPICAL
  Filled 2014-04-02 (×2): qty 56

## 2014-04-02 MED ORDER — METHYLERGONOVINE MALEATE 0.2 MG PO TABS
0.2000 mg | ORAL_TABLET | ORAL | Status: DC | PRN
  Administered 2014-04-02: 0.2 mg via ORAL
  Filled 2014-04-02 (×2): qty 1

## 2014-04-02 MED ORDER — SIMETHICONE 80 MG PO CHEW: 80.0000 mg | CHEWABLE_TABLET | ORAL | Status: DC | PRN

## 2014-04-02 MED ORDER — OXYTOCIN BOLUS FROM INFUSION: 500.0000 mL | INTRAVENOUS | Status: DC

## 2014-04-02 MED ORDER — WITCH HAZEL-GLYCERIN EX PADS: 1.0000 "application " | MEDICATED_PAD | CUTANEOUS | Status: DC | PRN

## 2014-04-02 MED ORDER — PRENATAL MULTIVITAMIN CH
1.0000 | ORAL_TABLET | Freq: Every day | ORAL | Status: DC
  Administered 2014-04-03: 1 via ORAL
  Filled 2014-04-02: qty 1

## 2014-04-02 MED ORDER — EPHEDRINE 5 MG/ML INJ: 10.0000 mg | INTRAVENOUS | Status: DC | PRN

## 2014-04-02 MED ORDER — LACTATED RINGERS IV SOLN: INTRAVENOUS | Status: DC

## 2014-04-02 MED ORDER — IBUPROFEN 600 MG PO TABS
600.0000 mg | ORAL_TABLET | Freq: Four times a day (QID) | ORAL | Status: DC | PRN
  Administered 2014-04-02: 600 mg via ORAL
  Filled 2014-04-02: qty 1

## 2014-04-02 MED ORDER — DIPHENHYDRAMINE HCL 25 MG PO CAPS: 25.0000 mg | ORAL_CAPSULE | Freq: Four times a day (QID) | ORAL | Status: DC | PRN

## 2014-04-02 MED ORDER — PHENYLEPHRINE 40 MCG/ML (10ML) SYRINGE FOR IV PUSH (FOR BLOOD PRESSURE SUPPORT): 80.0000 ug | PREFILLED_SYRINGE | INTRAVENOUS | Status: DC | PRN

## 2014-04-02 MED ORDER — LACTATED RINGERS IV SOLN: 500.0000 mL | INTRAVENOUS | Status: DC | PRN

## 2014-04-02 MED ORDER — LIDOCAINE HCL (PF) 1 % IJ SOLN
INTRAMUSCULAR | Status: DC | PRN
  Administered 2014-04-02 (×4): 4 mL

## 2014-04-02 MED ORDER — SENNOSIDES-DOCUSATE SODIUM 8.6-50 MG PO TABS
2.0000 | ORAL_TABLET | ORAL | Status: DC
  Administered 2014-04-03: 2 via ORAL
  Filled 2014-04-02: qty 2

## 2014-04-02 MED ORDER — OXYTOCIN 40 UNITS IN LACTATED RINGERS INFUSION - SIMPLE MED
62.5000 mL/h | INTRAVENOUS | Status: DC
  Administered 2014-04-02: 999 mL/h via INTRAVENOUS
  Filled 2014-04-02: qty 1000

## 2014-04-02 MED ORDER — CITRIC ACID-SODIUM CITRATE 334-500 MG/5ML PO SOLN: 30.0000 mL | ORAL | Status: DC | PRN

## 2014-04-02 MED ORDER — METHYLERGONOVINE MALEATE 0.2 MG/ML IJ SOLN: 0.2000 mg | INTRAMUSCULAR | Status: DC | PRN

## 2014-04-02 MED ORDER — ACETAMINOPHEN 325 MG PO TABS: 650.0000 mg | ORAL_TABLET | ORAL | Status: DC | PRN

## 2014-04-02 MED ORDER — PHENYLEPHRINE 40 MCG/ML (10ML) SYRINGE FOR IV PUSH (FOR BLOOD PRESSURE SUPPORT)
80.0000 ug | PREFILLED_SYRINGE | INTRAVENOUS | Status: DC | PRN
  Filled 2014-04-02: qty 10

## 2014-04-02 MED ORDER — ONDANSETRON HCL 4 MG/2ML IJ SOLN: 4.0000 mg | INTRAMUSCULAR | Status: DC | PRN

## 2014-04-02 MED ORDER — IBUPROFEN 600 MG PO TABS
600.0000 mg | ORAL_TABLET | Freq: Four times a day (QID) | ORAL | Status: DC
  Administered 2014-04-03 (×4): 600 mg via ORAL
  Filled 2014-04-02 (×4): qty 1

## 2014-04-02 MED ORDER — DIPHENHYDRAMINE HCL 50 MG/ML IJ SOLN: 12.5000 mg | INTRAMUSCULAR | Status: DC | PRN

## 2014-04-02 MED ORDER — ZOLPIDEM TARTRATE 5 MG PO TABS: 5.0000 mg | ORAL_TABLET | Freq: Every evening | ORAL | Status: DC | PRN

## 2014-04-02 MED ORDER — OXYCODONE-ACETAMINOPHEN 5-325 MG PO TABS: 1.0000 | ORAL_TABLET | ORAL | Status: DC | PRN

## 2014-04-02 MED ORDER — LIDOCAINE HCL (PF) 1 % IJ SOLN: 30.0000 mL | INTRAMUSCULAR | Status: DC | PRN

## 2014-04-02 MED ORDER — ONDANSETRON HCL 4 MG PO TABS: 4.0000 mg | ORAL_TABLET | ORAL | Status: DC | PRN

## 2014-04-02 MED ORDER — DIBUCAINE 1 % RE OINT
1.0000 "application " | TOPICAL_OINTMENT | RECTAL | Status: DC | PRN
  Filled 2014-04-02: qty 28

## 2014-04-02 MED ORDER — LANOLIN HYDROUS EX OINT: TOPICAL_OINTMENT | CUTANEOUS | Status: DC | PRN

## 2014-04-02 MED ORDER — FENTANYL 2.5 MCG/ML BUPIVACAINE 1/10 % EPIDURAL INFUSION (WH - ANES)
14.0000 mL/h | INTRAMUSCULAR | Status: DC | PRN
  Administered 2014-04-02: 14 mL/h via EPIDURAL
  Filled 2014-04-02: qty 125

## 2014-04-02 MED ORDER — ONDANSETRON HCL 4 MG/2ML IJ SOLN: 4.0000 mg | Freq: Four times a day (QID) | INTRAMUSCULAR | Status: DC | PRN

## 2014-04-02 NOTE — MAU Note (Signed)
Patient states she was at [facility] this am and was 4 cm. States contractions every 3 minutes with bloody show. Denies leaking and reports good fetal movement.

## 2014-04-02 NOTE — Anesthesia Procedure Notes (Signed)
Epidural Patient location during procedure: OB Start time: 04/02/2014 3:50 PM  Staffing Anesthesiologist: Nyaira Hodgens Performed by: anesthesiologist   Preanesthetic Checklist Completed: patient identified, site marked, surgical consent, pre-op evaluation, timeout performed, IV checked, risks and benefits discussed and monitors and equipment checked  Epidural Patient position: sitting Prep: site prepped and draped and DuraPrep Patient monitoring: continuous pulse ox and blood pressure Approach: midline Location: L3-L4 Injection technique: LOR air  Needle:  Needle type: Tuohy  Needle gauge: 17 G Needle length: 9 cm and 9 Needle insertion depth: 5.5 cm Catheter type: closed end flexible Catheter size: 19 Gauge Catheter at skin depth: 10.5 cm Test dose: negative  Assessment Events: blood not aspirated, injection not painful, no injection resistance, negative IV test and no paresthesia  Additional Notes Discussed risk of headache, infection, bleeding, nerve injury and failed or incomplete block.  Patient voices understanding and wishes to proceed.  Epidural placed easily on first attempt. No paresthesia.  Patient tolerated procedure well with no apparent complications.  Jasmine December, MDReason for block:procedure for pain

## 2014-04-02 NOTE — Progress Notes (Signed)
   Carrie Mckenzie is a 23 y.o. G3P1011 at [redacted]w[redacted]d  admitted for active labor  Subjective: Pain well tolerated with epidural.   Objective: Filed Vitals:   04/02/14 1631 04/02/14 1636 04/02/14 1641 04/02/14 1646  BP: 144/73     Pulse: 68 66 72 67  Temp:      TempSrc:      Resp:      Height:      Weight:      SpO2: 100% 100% 100% 100%      FHT: 130s, moderate variability, + accels, once late decel (around the time of SROM)    UC:   regular, every 2-3 minutes SVE:   Dilation: 8.5 Effacement (%): 100 Station: -1 Exam by:: [employer]: Lab Results  Component Value Date   WBC 8.6 04/02/2014   HGB 11.0* 04/02/2014   HCT 32.5* 04/02/2014   MCV 84.0 04/02/2014   PLT 229 04/02/2014    Assessment / Plan: Spontaneous labor, progressing normally  Labor: Progressing normally SROM @ 1650 Fetal Wellbeing:  Category I however 1 late decel noted at the time of SROM Pain Control:  Epidural Anticipated MOD:  NSVD  Joanna Puff 04/02/2014, 4:56 PM

## 2014-04-02 NOTE — H&P (Signed)
Carrie Mckenzie is a 23 y.o. female G3P1011 with IUP at [redacted]w[redacted]d presenting for contractions.  She was seen by Dr. Elonda Husky this AM and her membranes were stripped and the patient noted her contractions became more consistent and are currently about every 2 minutes.  +Bloody show, no LOF. Good fetal movement  [facility] since [redacted]w[redacted]d wks  EDD 04/07/14 by LMP, consistent with [redacted]w[redacted]d U/S   Prenatal History/Complications: None  Past Medical History: Past Medical History  Diagnosis Date  . Anxiety   . Vaginal discharge 12/17/2013  . BV (bacterial vaginosis) 12/17/2013  . Ovarian cyst     Past Surgical History: Past Surgical History  Procedure Laterality Date  . Cholecystectomy    . Wisdom tooth extraction      Obstetrical History: OB History   Grav Para Term Preterm Abortions TAB SAB Ect Mult Living   '3 1 1 '$ 0 1 0 1 0 0 1       Social History: History   Social History  . Marital Status: Single    Spouse Name: N/A    Number of Children: N/A  . Years of Education: N/A   Social History Main Topics  . Smoking status: Current Every Day Smoker -- 0.50 packs/day for 10 years    Types: Cigarettes  . Smokeless tobacco: Never Used  . Alcohol Use: No  . Drug Use: No  . Sexual Activity: Yes    Birth Control/ Protection: None   Other Topics Concern  . None   Social History Narrative  . None    Family History: Family History  Problem Relation Age of Onset  . Diabetes Mother   . Thyroid disease Mother     had thyroid removed  . Hypertension Father   . Cancer Father     prostate  . Diabetes Maternal Aunt   . Diabetes Maternal Uncle   . Cancer Maternal Grandmother     ovarian, uterine  . Diabetes Maternal Aunt   . Diabetes Maternal Aunt   . Diabetes Maternal Uncle   . Diabetes Maternal Uncle   . Diabetes Maternal Uncle   . Diabetes Maternal Uncle   . Diabetes Maternal Uncle   . Diabetes Maternal Uncle     Allergies: No Known Allergies  Prescriptions prior to  admission  Medication Sig Dispense Refill  . acyclovir (ZOVIRAX) 400 MG tablet Take 1 tablet (400 mg total) by mouth 3 (three) times daily.  30 tablet  3  . butalbital-acetaminophen-caffeine (FIORICET) 50-325-40 MG per tablet Take 1-2 tablets by mouth every 6 (six) hours as needed for headache.  30 tablet  0  . butalbital-aspirin-caffeine (FIORINAL) 50-325-40 MG per capsule Take 1 capsule by mouth every 4 (four) hours as needed for headache.  30 capsule  0  . ondansetron (ZOFRAN ODT) 4 MG disintegrating tablet Take 1 tablet (4 mg total) by mouth every 6 (six) hours as needed for nausea.  30 tablet  2  . Pediatric Multiple Vit-C-FA (FLINSTONES GUMMIES OMEGA-3 DHA PO) Take by mouth. Takes 2 daily         Review of Systems   Constitutional: No fever, chills, or fatiuge  Height $Remov'5\' 5"'rLHoyD$  (1.651 m), weight 79.379 kg (175 lb), last menstrual period 07/01/2013. General appearance: alert, cooperative, appears stated age and no distress Lungs: clear to auscultation bilaterally Heart: regular rate and rhythm Abdomen: soft, non-tender; bowel sounds normal Extremities: Homans sign is negative, no sign of DVT DTR's +2 Presentation: cephalic from 00Q U/S Fetal monitoringBaseline:  140 bpm, Variability: Good {> 6 bpm), Accelerations: Reactive and Decelerations: Absent Uterine activityFrequency: Every 3 minutes Dilation: 5.5 Effacement (%): 90 Station: -2;-1 Exam by:: jolynn   Prenatal labs: ABO, Rh: A/POS/-- (01/19 1045) Antibody: NEG (06/11 0858) Rubella:   Non-immune RPR: NON REAC (06/11 0858)  HBsAg: NEGATIVE (01/19 1045)  HIV: NONREACTIVE (06/11 0858)  GBS: NOT DETECTED (08/06 1527)  2hr GTT 66, 116, 120 Genetic screening  NT/IT: neg   [place], [place], black, 1st baby  Pap 08/27/13: neg  GC/CT Initial:   -/-             36+wks:  Genetic Screen NT/IT: neg  CF screen neg  Anatomic [place] Normal female 'Brylan'  Flu vaccine 06/2013  Glucose Screen  2 hr  66/116/120   GBS negative  Feed Preference breast  Contraception POPs or Mirena  Circumcision Yes, if boy, at [facility]- already paid  Childbirth Classes declined  Pediatrician [facility]       Prenatal Transfer Tool  Maternal Diabetes: No Genetic Screening: Normal Maternal Ultrasounds/Referrals: Normal Fetal Ultrasounds or other Referrals:  None Maternal Substance Abuse:  No Significant Maternal Medications:  Meds include: Other: Fioricet, fiorinla, zofran, PNV Significant Maternal Lab Results: Lab values include: Group B Strep negative     Results for orders placed in visit on 04/02/14 (from the past 24 hour(s))  POCT URINALYSIS DIPSTICK   Collection Time    04/02/14 10:51 AM      Result Value Ref Range   Color, UA       Clarity, UA       Glucose, UA neg     Bilirubin, UA       Ketones, UA neg     Spec Grav, UA       Blood, UA neg     pH, UA       Protein, UA trace     Urobilinogen, UA       Nitrite, UA neg     Leukocytes, UA Negative      Assessment: Carrie Mckenzie is a 23 y.o. G3P1011 at [redacted]w[redacted]d by LMP, consistent with [redacted]w[redacted]d U/S presenting with regular contractions  #Labor:Progressing normally. Will hold augmentation currently #Pain: Epidural #FWB: Cat 1 #ID:  GBS neg, will need MMR postpartum  #MOF: breast #MOC: Mirena #Circ:  [facility]   Archie Patten 04/02/2014, 2:38 PM  I was present for the exam and agree with above. [place], [place] 04/02/2014 7:19 PM

## 2014-04-02 NOTE — H&P (Signed)
Attestation of Attending Supervision of Advanced Practitioner (PA/CNM/NP): Evaluation and management procedures were performed by the Advanced Practitioner under my supervision and collaboration.  I have reviewed the Advanced Practitioner's note and chart, and I agree with the management and plan.  Khalil Szczepanik, MD, FACOG Attending Obstetrician & Gynecologist Faculty Practice, [facility]   

## 2014-04-02 NOTE — Anesthesia Preprocedure Evaluation (Signed)
Anesthesia Evaluation  Patient identified by MRN, date of birth, ID band Patient awake    Reviewed: Allergy & Precautions, H&P , NPO status , Patient's Chart, lab work & pertinent test results, reviewed documented beta blocker date and time   History of Anesthesia Complications Negative for: history of anesthetic complications  Airway Mallampati: I TM Distance: >3 FB Neck ROM: full    Dental  (+) Teeth Intact   Pulmonary Current Smoker,  breath sounds clear to auscultation        Cardiovascular negative cardio ROS  Rhythm:regular Rate:Normal     Neuro/Psych Anxiety negative neurological ROS     GI/Hepatic negative GI ROS, Neg liver ROS,   Endo/Other  negative endocrine ROS  Renal/GU negative Renal ROS     Musculoskeletal   Abdominal   Peds  Hematology negative hematology ROS (+)   Anesthesia Other Findings   Reproductive/Obstetrics (+) Pregnancy                           Anesthesia Physical Anesthesia Plan  ASA: II  Anesthesia Plan: Epidural   Post-op Pain Management:    Induction:   Airway Management Planned:   Additional Equipment:   Intra-op Plan:   Post-operative Plan:   Informed Consent: I have reviewed the patients History and Physical, chart, labs and discussed the procedure including the risks, benefits and alternatives for the proposed anesthesia with the patient or authorized representative who has indicated his/her understanding and acceptance.     Plan Discussed with:   Anesthesia Plan Comments:         Anesthesia Quick Evaluation

## 2014-04-02 NOTE — Progress Notes (Signed)
Membranes stripped  G3P1011 [redacted]w[redacted]d Estimated Date of Delivery: 04/07/14  Blood pressure 128/68, weight 175 lb (79.379 kg), last menstrual period 07/01/2013.   BP weight and urine results all reviewed and noted.  Please refer to the obstetrical flow sheet for the fundal height and fetal heart rate documentation:  Patient reports good fetal movement, denies any bleeding and no rupture of membranes symptoms or regular contractions. Patient is without complaints. All questions were answered.  Plan:  Continued routine obstetrical care,   Follow up in 1 weeks for OB appointment, ob visit

## 2014-04-02 NOTE — MAU Note (Signed)
Some ctx's prior to exam, really got bad and regular after membranes stripped. No problems with preg

## 2014-04-02 NOTE — Progress Notes (Signed)
Pt denies any problems or concerns at this time.  

## 2014-04-03 LAB — CBC
HEMATOCRIT: 29.4 % — AB (ref 36.0–46.0)
HEMOGLOBIN: 9.8 g/dL — AB (ref 12.0–15.0)
MCH: 27.9 pg (ref 26.0–34.0)
MCHC: 33.3 g/dL (ref 30.0–36.0)
MCV: 83.8 fL (ref 78.0–100.0)
Platelets: 203 10*3/uL (ref 150–400)
RBC: 3.51 MIL/uL — AB (ref 3.87–5.11)
RDW: 12.6 % (ref 11.5–15.5)
WBC: 8.1 10*3/uL (ref 4.0–10.5)

## 2014-04-03 LAB — RPR

## 2014-04-03 MED ORDER — DOCUSATE SODIUM 100 MG PO CAPS: 100.0000 mg | ORAL_CAPSULE | Freq: Two times a day (BID) | ORAL | Status: DC

## 2014-04-03 MED ORDER — MEASLES, MUMPS & RUBELLA VAC ~~LOC~~ INJ
0.5000 mL | INJECTION | Freq: Once | SUBCUTANEOUS | Status: AC
Start: 2014-04-03 — End: 2014-04-03
  Administered 2014-04-03: 0.5 mL via SUBCUTANEOUS
  Filled 2014-04-03 (×2): qty 0.5

## 2014-04-03 MED ORDER — IBUPROFEN 400 MG PO TABS: 400.0000 mg | ORAL_TABLET | Freq: Four times a day (QID) | ORAL | Status: DC | PRN

## 2014-04-03 MED ORDER — PRENATAL MULTIVITAMIN CH: 1.0000 | ORAL_TABLET | Freq: Every day | ORAL | Status: DC

## 2014-04-03 NOTE — Lactation Note (Signed)
This note was copied from the chart of Carrie Mckenzie. Lactation Consultation Note  Patient Name: Carrie Mckenzie [id number] Date: 04/03/2014 Reason for consult: Initial assessment;Breast/nipple pain Mom declined to latch baby at this visit due to nipple pain. On exam, bilateral nipples at base are excoriated, almost to the cracking stage. Mom is not sure if she wants to continue to BF. She did not breastfeed her 1st baby. LC reviewed basic teaching. Discussed some options for Mom to consider. Baby awake and giving feeding ques at this visit. LC demonstrated to Mom how to hand express, but only obtained 1 drop of colostrum from left breast. Mom wants baby to eat. Discussed how to supplement with BF, reviewing guidelines for supplementing w/BF. Demonstrated to FOB how to give baby 7 ml of Gerber by finger feeding with curved tipped syringe. LC stressed to Mom importance of asking for assist with latch and latching baby so she and Baby can learn to BF. Offered Mom the option of giving breast rest for 1-2 feedings then call for assist with latch. Discussed possible using a nipple shield to see if this would alleviate discomfort, discussed pump/bottle feeding. Gave Mom hand pump, changed flange to size 30 for comfort. Advised Mom if she plans to BF or give EBM, breasts need to be stimulated every 3 hours to bring milk in. Mom will use hand pump for now and advise LC of what feeding plan will work best for her. Lactation brochure left for review, advised of OP services and support group. Encouraged to call for assist with feedings, or if she decides to pump/bottle advise RN to set up DEBP. RN advised of discussion.   Maternal Data Formula Feeding for Exclusion: Yes Reason for exclusion: Mother's choice to formula and breast feed on admission Has patient been taught Hand Expression?: Yes Does the patient have breastfeeding experience prior to this delivery?: No  Feeding    LATCH  Score/Interventions       Type of Nipple: Everted at rest and after stimulation  Comfort (Breast/Nipple): Filling, red/small blisters or bruises, mild/mod discomfort  Problem noted: Severe discomfort        Lactation Tools Discussed/Used     Consult Status Consult Status: Follow-up Date: 04/04/14 Follow-up type: In-patient    Alfred Levins 04/03/2014, 3:47 PM

## 2014-04-03 NOTE — Plan of Care (Signed)
Problem: Discharge Progression Outcomes Goal: Barriers To Progression Addressed/Resolved Outcome: Completed/Met Date Met:  04/03/14 Patient able to independently care for self and infant. Social worker consult completed based of Mom issues with anxiety. Parents verbalize the readiness for discharge.

## 2014-04-03 NOTE — Discharge Summary (Signed)
Obstetric Discharge Summary Reason for Admission: onset of labor Prenatal Procedures: NST Intrapartum Procedures: spontaneous vaginal delivery Postpartum Procedures: Methergine x 1 for trailing membranes Complications-Operative and Postpartum: none Hemoglobin  Date Value Ref Range Status  04/03/2014 9.8* 12.0 - 15.0 g/dL Final     HCT  Date Value Ref Range Status  04/03/2014 29.4* 36.0 - 46.0 % Final    Discharge Diagnoses: Term Pregnancy-delivered  Hospital Course:  Carrie Mckenzie is a 23 y.o. [id number] who presented with SOL.  She had a uncomplicated SVD with trailing membranes. She received methergine x 1 which was d/c'd for sBP 150s. Her postpartum bleeding was minimal and improving at discharge.  She was able to ambulate, tolerate PO and void normally. She was discharged home with instructions for postpartum care.    Delivery Note  At 5:51 PM a viable female was delivered via Vaginal, Spontaneous Delivery (Presentation: Right Occiput Anterior). APGAR: 8, 9 ; weight pending .  Placenta status: Intact, Spontaneous. Cord: 3 vessels with the following complications: None. Cord pH: pending  Anesthesia: Epidural  Episiotomy: None  Lacerations: None  Suture Repair: N/A  Est. Blood Loss (mL):  Mom to postpartum. Baby to Couplet care / Skin to Skin.  Joanna Puff  04/02/2014, 6:27 PM  I was present for the entire delivery and agree with above. Adherent training membranes removed apparently intact w/ gentle traction and twisting w/ ring forceps. Monitor closely for retained membranes.  Methergine series.  [place], [place]  04/02/2014  6:47 PM   Physical Exam:  General: alert, cooperative and no distress Lochia: appropriate Uterine Fundus: firm DVT Evaluation: No evidence of DVT seen on physical exam. Negative Homan's sign. No cords or calf tenderness. No significant calf/ankle edema.  Discharge Information: Date: 04/03/2014 Activity: pelvic rest Diet:  routine Medications: PNV, Ibuprofen and Colace Baby feeding: plans to breastfeed Contraception: IUD Condition: stable Instructions: refer to practice specific booklet Discharge to: home   Newborn Data: Live born female  Birth Weight: 7 lb 5.1 oz (3320 g) APGAR: 8, 9  Home with mother.  Wenda Low, MD [facility] FM PGY-2 04/03/2014, 9:05 AM  OB fellow attestation Post Partum Day 1 I have seen and examined this patient and agree with above documentation in the resident's note.  Carrie Mckenzie is a 23 y.o. [id number] s/p SVD.  Pt denies problems with ambulating, voiding or po intake. Pain is well controlled.  .  Plan for birth control is POPs, possibly mirena.  Method of Feeding: breast PE:  BP 131/58  Pulse 53  Temp(Src) 98.5 F (36.9 C) (Oral)  Resp 20  Ht  (1.651 m)  Wt 175 lb (79.379 kg)  BMI 29.12 kg/m2  SpO2 100%  LMP 07/01/2013  Breastfeeding? Unknown Fundus firm Plan for discharge: today  Perry Mount, MD 9:13 AM

## 2014-04-03 NOTE — Anesthesia Postprocedure Evaluation (Signed)
  Anesthesia Post-op Note  Patient: Carrie Mckenzie  Procedure(s) Performed: * No procedures listed *  Patient Location: PACU  Anesthesia Type:Epidural  Level of Consciousness: awake  Airway and Oxygen Therapy: Patient Spontanous Breathing  Post-op Pain: none  Post-op Assessment: Post-op Vital signs reviewed, Patient's Cardiovascular Status Stable, Respiratory Function Stable, Patent Airway, No signs of Nausea or vomiting, Adequate PO intake, Pain level controlled, No headache and No backache  Post-op Vital Signs: Reviewed and stable  Last Vitals:  Filed Vitals:   04/03/14 0435  BP: 131/58  Pulse: 53  Temp: 36.9 C  Resp: 20    Complications: No apparent anesthesia complications

## 2014-04-03 NOTE — Progress Notes (Signed)
Clinical Social Work Department PSYCHOSOCIAL ASSESSMENT - MATERNAL/CHILD 04/03/2014  Patient:  Carrie Mckenzie, Carrie Mckenzie  Account Number:  [id number]  Admit Date:  04/02/2014  Marjo Bicker Name:   Carrie Mckenzie    Clinical Social Worker:  Loleta Books, CLINICAL SOCIAL WORKER   Date/Time:  04/03/2014 04:00 PM  Date Referred:  04/02/2014   Referral source  Central Nursery     Referred reason  Depression/Anxiety   Other referral source:    I:  FAMILY / HOME ENVIRONMENT Child's legal guardian:  PARENT  Guardian - Name Guardian - Age Guardian - Address  Carrie Mckenzie [address], [place], [place] [address]   Other household support members/support persons Name Relationship DOB  Carrie Mckenzie SIGNIFICANT OTHER    MOTHER    DAUGHTER 54 years old   Other support:   MOB shared that she lives at home with her mother and her 28 year old daughter.  The FOB and MOB are not in a relationship, but MOB stated that they intend to "co-parent".  MOB is currently in a relationship with Carrie Mckenzie, and she stated that he is supportive.  Per Broadus John, he plans on being a stepfather to the baby.    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   Financial resources:  Medicaid If Medicaid - County:  [place] Other  Sales executive  WIC   School / Grade:  N/A Government social research officer / Child Services Coordination / Early Interventions:   N/A  Cultural issues impacting care:   None reported    III  STRENGTHS Strengths  Adequate Resources  Supportive family/friends  Home prepared for Child (including basic supplies)   Strength comment:    IV  RISK FACTORS AND CURRENT PROBLEMS Current Problem:  YES   Risk Factor & Current Problem Patient Issue Family Issue Risk Factor / Current Problem Comment  Mental Illness Y N  MOB reported history of anxiety one year ago when her daugher went to kindergarten.  She stated that she was prescribed Zoloft, but only needed to take for 2 months. She denied any symptoms of anxiety in past 9 months.         V  SOCIAL WORK ASSESSMENT CSW met with MOB and her significant other in order to complete the assessment. Consult ordered due to MOB presenting with a history of anxiety.   MOB was attentive and easily engaged, and provided consent for her boyfriend to be present throughout the assessment. MOB acknowledged reason for consult, and was wiling to explore with CSW her previous symptoms of anxiety.  CSW did not note/observe any acute mental health symptoms or any psychosocial stressors that warrant further intervention.   MOB expressed excitement upon the birth of the baby, but shared that she was upset when she first learned that she was pregnant since she was no longer in a relationship with the FOB.  CSW inquired about change in feelings since she is now expressing excitement, and she shared that her current boyfriend assisted her to cope with being pregnant and to become excited. MOB denied any ongoing relational stressors related to the FOB, and shared that he has visited the baby at the hospital and plans on being involved as a co-parent.      CSW inquired about history of anxiety.  MOB shared that she became anxious  when her daughter went to kindergarten, and she experienced symptoms of being tearful and having racing thoughts.  MOB discussed how she consulted with her MD who prescribed her Zoloft.  She shared that within 2 months, her symptoms had been reduced, and she stopped taking medications.  MOB denied any symptoms in past 9-10 months.  CSW inquired about thoughts and feelings related to restarting medication if warranted in the future.  MOB did not express any negative core beliefs related to medications, and acknowledges that it is normal for anxiety to increase during  postpartum period.  MOB denied history of postpartum depression, but was receptive to receiving education from [place].   No barriers to discharge.  VI SOCIAL WORK PLAN Social Work Therapist, art  No Further Intervention Required / No Barriers to Discharge   Type of pt/family education:   Postpartum depression   If child protective services report - county:   If child protective services report - date:   Information/referral to community resources comment:   N/A   Other social work plan:   Ongoing emotional support as needed.

## 2014-04-03 NOTE — Progress Notes (Signed)
BP 152/46; bleeding has been small to moderate. Per Dr Gayla Doss, methergine not given at this time. Will monitor bleeding and notify MD if increases.

## 2014-04-03 NOTE — Discharge Instructions (Signed)
Postpartum Care After Vaginal Delivery  °After you deliver your newborn (postpartum period), the usual stay in the hospital is 24-72 hours. If there were problems with your labor or delivery, or if you have other medical problems, you might be in the hospital longer.  °While you are in the hospital, you will receive help and instructions on how to care for yourself and your newborn during the postpartum period.  °While you are in the hospital:  °Be sure to tell your nurses if you have pain or discomfort, as well as where you feel the pain and what makes the pain worse.  °If you had an incision made near your vagina (episiotomy) or if you had some tearing during delivery, the nurses may put ice packs on your episiotomy or tear. The ice packs may help to reduce the pain and swelling.  °If you are breastfeeding, you may feel uncomfortable contractions of your uterus for a couple of weeks. This is normal. The contractions help your uterus get back to normal size.  °It is normal to have some bleeding after delivery.  °For the first 1-3 days after delivery, the flow is red and the amount may be similar to a period.  °It is common for the flow to start and stop.  °In the first few days, you may pass some small clots. Let your nurses know if you begin to pass large clots or your flow increases.  °Do not flush blood clots down the toilet before having the nurse look at them.  °During the next 3-10 days after delivery, your flow should become more watery and pink or brown-tinged in color.  °Ten to fourteen days after delivery, your flow should be a small amount of yellowish-white discharge.  °The amount of your flow will decrease over the first few weeks after delivery. Your flow may stop in 6-8 weeks. Most women have had their flow stop by 12 weeks after delivery. °You should change your sanitary pads frequently.  °Wash your hands thoroughly with soap and water for at least 20 seconds after changing pads, using the toilet,  or before holding or feeding your newborn.  °You should feel like you need to empty your bladder within the first 6-8 hours after delivery.  °In case you become weak, lightheaded, or faint, call your nurse before you get out of bed for the first time and before you take a shower for the first time.  °Within the first few days after delivery, your breasts may begin to feel tender and full. This is called engorgement. Breast tenderness usually goes away within 48-72 hours after engorgement occurs. You may also notice milk leaking from your breasts. If you are not breastfeeding, do not stimulate your breasts. Breast stimulation can make your breasts produce more milk.  °Spending as much time as possible with your newborn is very important. During this time, you and your newborn can feel close and get to know each other. Having your newborn stay in your room (rooming in) will help to strengthen the bond with your newborn. It will give you time to get to know your newborn and become comfortable caring for your newborn.  °Your hormones change after delivery. Sometimes the hormone changes can temporarily cause you to feel sad or tearful. These feelings should not last more than a few days. If these feelings last longer than that, you should talk to your caregiver.  °If desired, talk to your caregiver about methods of family planning or contraception.  °  Talk to your caregiver about immunizations. Your caregiver may want you to have the following immunizations before leaving the hospital:  °Tetanus, diphtheria, and pertussis (Tdap) or tetanus and diphtheria (Td) immunization. It is very important that you and your family (including grandparents) or others caring for your newborn are up-to-date with the Tdap or Td immunizations. The Tdap or Td immunization can help protect your newborn from getting ill.  °Rubella immunization.  °Varicella (chickenpox) immunization.  °Influenza immunization. You should receive this annual  immunization if you did not receive the immunization during your pregnancy. °Document Released: 05/23/2007 Document Revised: 04/19/2012 Document Reviewed: 03/22/2012  °Exit[employer]® Patient Information ©2015 [employer]. This information is not intended to replace advice given to you by your health care provider. Make sure you discuss any questions you have with your health care provider.  ° °Breastfeeding °Deciding to breastfeed is one of the best choices you can make for you and your baby. A change in hormones during pregnancy causes your breast tissue to grow and increases the number and size of your milk ducts. These hormones also allow proteins, sugars, and fats from your blood supply to make breast milk in your milk-producing glands. Hormones prevent breast milk from being released before your baby is born as well as prompt milk flow after birth. Once breastfeeding has begun, thoughts of your baby, as well as his or her sucking or crying, can stimulate the release of milk from your milk-producing glands.  °BENEFITS OF BREASTFEEDING °For Your Baby °· Your first milk (colostrum) helps your baby's digestive system function better.   °· There are antibodies in your milk that help your baby fight off infections.   °· Your baby has a lower incidence of asthma, allergies, and sudden infant death syndrome.   °· The nutrients in breast milk are better for your baby than infant formulas and are designed uniquely for your baby's needs.   °· Breast milk improves your baby's brain development.   °· Your baby is less likely to develop other conditions, such as childhood obesity, asthma, or type 2 diabetes mellitus.   °For You  °· Breastfeeding helps to create a very special bond between you and your baby.   °· Breastfeeding is convenient. Breast milk is always available at the correct temperature and costs nothing.   °· Breastfeeding helps to burn calories and helps you lose the weight gained during pregnancy.    °· Breastfeeding makes your uterus contract to its prepregnancy size faster and slows bleeding (lochia) after you give birth.   °· Breastfeeding helps to lower your risk of developing type 2 diabetes mellitus, osteoporosis, and breast or ovarian cancer later in life. °SIGNS THAT YOUR BABY IS HUNGRY °Early Signs of Hunger  °· Increased alertness or activity. °· Stretching. °· Movement of the head from side to side. °· Movement of the head and opening of the mouth when the corner of the mouth or cheek is stroked (rooting). °· Increased sucking sounds, smacking lips, cooing, sighing, or squeaking. °· Hand-to-mouth movements. °· Increased sucking of fingers or hands. °Late Signs of Hunger °· Fussing. °· Intermittent crying. °Extreme Signs of Hunger °Signs of extreme hunger will require calming and consoling before your baby will be able to breastfeed successfully. Do not wait for the following signs of extreme hunger to occur before you initiate breastfeeding:   °· Restlessness. °· A loud, strong cry. °·  Screaming. °BREASTFEEDING BASICS °Breastfeeding Initiation °· Find a comfortable place to sit or lie down, with your neck and back well supported. °· Place a pillow or   rolled up blanket under your baby to bring him or her to the level of your breast (if you are seated). Nursing pillows are specially designed to help support your arms and your baby while you breastfeed. °· Make sure that your baby's abdomen is facing your abdomen.   °· Gently massage your breast. With your fingertips, massage from your chest wall toward your nipple in a circular motion. This encourages milk flow. You may need to continue this action during the feeding if your milk flows slowly. °· Support your breast with 4 fingers underneath and your thumb above your nipple. Make sure your fingers are well away from your nipple and your baby's mouth.   °· Stroke your baby's lips gently with your finger or nipple.   °· When your baby's mouth is open  wide enough, quickly bring your baby to your breast, placing your entire nipple and as much of the colored area around your nipple (areola) as possible into your baby's mouth.   °¨ More areola should be visible above your baby's upper lip than below the lower lip.   °¨ Your baby's tongue should be between his or her lower gum and your breast.   °· Ensure that your baby's mouth is correctly positioned around your nipple (latched). Your baby's lips should create a seal on your breast and be turned out (everted). °· It is common for your baby to suck about 2-3 minutes in order to start the flow of breast milk. °Latching °Teaching your baby how to latch on to your breast properly is very important. An improper latch can cause nipple pain and decreased milk supply for you and poor weight gain in your baby. Also, if your baby is not latched onto your nipple properly, he or she may swallow some air during feeding. This can make your baby fussy. Burping your baby when you switch breasts during the feeding can help to get rid of the air. However, teaching your baby to latch on properly is still the best way to prevent fussiness from swallowing air while breastfeeding. °Signs that your baby has successfully latched on to your nipple:    °· Silent tugging or silent sucking, without causing you pain.   °· Swallowing heard between every 3-4 sucks.   °·  Muscle movement above and in front of his or her ears while sucking.   °Signs that your baby has not successfully latched on to nipple:  °· Sucking sounds or smacking sounds from your baby while breastfeeding. °· Nipple pain. °If you think your baby has not latched on correctly, slip your finger into the corner of your baby's mouth to break the suction and place it between your baby's gums. Attempt breastfeeding initiation again. °Signs of Successful Breastfeeding °Signs from your baby:   °· A gradual decrease in the number of sucks or complete cessation of sucking.   °· Falling  asleep.   °· Relaxation of his or her body.   °· Retention of a small amount of milk in his or her mouth.   °· Letting go of your breast by himself or herself. °Signs from you: °· Breasts that have increased in firmness, weight, and size 1-3 hours after feeding.   °· Breasts that are softer immediately after breastfeeding. °· Increased milk volume, as well as a change in milk consistency and color by the fifth day of breastfeeding.   °· Nipples that are not sore, cracked, or bleeding. °Signs That Your Baby is Getting Enough Milk °· Wetting at least 3 diapers in a 24-hour period. The urine should be clear and   pale yellow by age 5 days. °· At least 3 stools in a 24-hour period by age 5 days. The stool should be soft and yellow. °· At least 3 stools in a 24-hour period by age 7 days. The stool should be seedy and yellow. °· No loss of weight greater than 10% of birth weight during the first 3 days of age. °· Average weight gain of 4-7 ounces (113-198 g) per week after age 4 days. °· Consistent daily weight gain by age 5 days, without weight loss after the age of 2 weeks. °After a feeding, your baby may spit up a small amount. This is common. °BREASTFEEDING FREQUENCY AND DURATION °Frequent feeding will help you make more milk and can prevent sore nipples and breast engorgement. Breastfeed when you feel the need to reduce the fullness of your breasts or when your baby shows signs of hunger. This is called "breastfeeding on demand." Avoid introducing a pacifier to your baby while you are working to establish breastfeeding (the first 4-6 weeks after your baby is born). After this time you may choose to use a pacifier. Research has shown that pacifier use during the first year of a baby's life decreases the risk of sudden infant death syndrome (SIDS). °Allow your baby to feed on each breast as long as he or she wants. Breastfeed until your baby is finished feeding. When your baby unlatches or falls asleep while feeding from  the first breast, offer the second breast. Because newborns are often sleepy in the first few weeks of life, you may need to awaken your baby to get him or her to feed. °Breastfeeding times will vary from baby to baby. However, the following rules can serve as a guide to help you ensure that your baby is properly fed: °· Newborns (babies 4 weeks of age or younger) may breastfeed every 1-3 hours. °· Newborns should not go longer than 3 hours during the day or 5 hours during the night without breastfeeding. °· You should breastfeed your baby a minimum of 8 times in a 24-hour period until you begin to introduce solid foods to your baby at around 6 months of age. °BREAST MILK PUMPING °Pumping and storing breast milk allows you to ensure that your baby is exclusively fed your breast milk, even at times when you are unable to breastfeed. This is especially important if you are going back to work while you are still breastfeeding or when you are not able to be present during feedings. Your lactation consultant can give you guidelines on how long it is safe to store breast milk.  °A breast pump is a machine that allows you to pump milk from your breast into a sterile bottle. The pumped breast milk can then be stored in a refrigerator or freezer. Some breast pumps are operated by hand, while others use electricity. Ask your lactation consultant which type will work best for you. Breast pumps can be purchased, but some hospitals and breastfeeding support groups lease breast pumps on a monthly basis. A lactation consultant can teach you how to hand express breast milk, if you prefer not to use a pump.  °CARING FOR YOUR BREASTS WHILE YOU BREASTFEED °Nipples can become dry, cracked, and sore while breastfeeding. The following recommendations can help keep your breasts moisturized and healthy: °· Avoid using soap on your nipples.   °· Wear a supportive bra. Although not required, special nursing bras and tank tops are designed to  allow access to your breasts for   breastfeeding without taking off your entire bra or top. Avoid wearing underwire-style bras or extremely tight bras. °· Air dry your nipples for 3-4 minutes after each feeding.   °· Use only cotton bra pads to absorb leaked breast milk. Leaking of breast milk between feedings is normal.   °· Use lanolin on your nipples after breastfeeding. Lanolin helps to maintain your skin's normal moisture barrier. If you use pure lanolin, you do not need to wash it off before feeding your baby again. Pure lanolin is not toxic to your baby. You may also hand express a few drops of breast milk and gently massage that milk into your nipples and allow the milk to air dry. °In the first few weeks after giving birth, some women experience extremely full breasts (engorgement). Engorgement can make your breasts feel heavy, warm, and tender to the touch. Engorgement peaks within 3-5 days after you give birth. The following recommendations can help ease engorgement: °· Completely empty your breasts while breastfeeding or pumping. You may want to start by applying warm, moist heat (in the shower or with warm water-soaked hand towels) just before feeding or pumping. This increases circulation and helps the milk flow. If your baby does not completely empty your breasts while breastfeeding, pump any extra milk after he or she is finished. °· Wear a snug bra (nursing or regular) or tank top for 1-2 days to signal your body to slightly decrease milk production. °· Apply ice packs to your breasts, unless this is too uncomfortable for you. °· Make sure that your baby is latched on and positioned properly while breastfeeding. °If engorgement persists after 48 hours of following these recommendations, contact your health care provider or a lactation consultant. °OVERALL HEALTH CARE RECOMMENDATIONS WHILE BREASTFEEDING °· Eat healthy foods. Alternate between meals and snacks, eating 3 of each per day. Because what you  eat affects your breast milk, some of the foods may make your baby more irritable than usual. Avoid eating these foods if you are sure that they are negatively affecting your baby. °· Drink milk, fruit juice, and water to satisfy your thirst (about 10 glasses a day).   °· Rest often, relax, and continue to take your prenatal vitamins to prevent fatigue, stress, and anemia. °· Continue breast self-awareness checks. °· Avoid chewing and smoking tobacco. °· Avoid alcohol and drug use. °Some medicines that may be harmful to your baby can pass through breast milk. It is important to ask your health care provider before taking any medicine, including all over-the-counter and prescription medicine as well as vitamin and herbal supplements. °It is possible to become pregnant while breastfeeding. If birth control is desired, ask your health care provider about options that will be safe for your baby. °SEEK MEDICAL CARE IF:  °· You feel like you want to stop breastfeeding or have become frustrated with breastfeeding. °· You have painful breasts or nipples. °· Your nipples are cracked or bleeding. °· Your breasts are red, tender, or warm. °· You have a swollen area on either breast. °· You have a fever or chills. °· You have nausea or vomiting. °· You have drainage other than breast milk from your nipples. °· Your breasts do not become full before feedings by the fifth day after you give birth. °· You feel sad and depressed. °· Your baby is too sleepy to eat well. °· Your baby is having trouble sleeping.   °· Your baby is wetting less than 3 diapers in a 24-hour period. °· Your baby has   less than 3 stools in a 24-hour period. °· Your baby's skin or the white part of his or her eyes becomes yellow.   °· Your baby is not gaining weight by 5 days of age. °SEEK IMMEDIATE MEDICAL CARE IF:  °· Your baby is overly tired (lethargic) and does not want to wake up and feed. °· Your baby develops an unexplained fever. °Document Released:  07/26/2005 Document Revised: 07/31/2013 Document Reviewed: 01/17/2013 °[employer]® Patient Information ©2015 [employer]. This information is not intended to replace advice given to you by your health care provider. Make sure you discuss any questions you have with your health care provider. ° °

## 2014-04-04 NOTE — Progress Notes (Signed)
Post discharge chart review completed.  

## 2014-04-08 ENCOUNTER — Telehealth: Payer: Self-pay | Admitting: Obstetrics & Gynecology

## 2014-04-08 NOTE — Telephone Encounter (Signed)
Pt states vaginal delivery on 04/02/2014, HGB 9.5 on 04/05/2014 with [facility]. Pt encouraged to continue PNV, add iron supplement, increase foods rich in iron. Pt to keep her 6 weeks postpartum appt. Pt verbalized understanding.

## 2014-04-09 ENCOUNTER — Encounter: Payer: Medicaid Other | Admitting: Obstetrics & Gynecology

## 2014-04-22 ENCOUNTER — Telehealth: Payer: Self-pay | Admitting: *Deleted

## 2014-04-24 NOTE — Telephone Encounter (Signed)
Spoke with Cathie Beams, CNM and states pt can begin Maternity Leave on 03/21/2014. [employer] informed of dates.

## 2014-05-13 ENCOUNTER — Encounter: Payer: Self-pay | Admitting: Women's Health

## 2014-05-13 ENCOUNTER — Ambulatory Visit (INDEPENDENT_AMBULATORY_CARE_PROVIDER_SITE_OTHER): Payer: Medicaid Other | Admitting: Women's Health

## 2014-05-13 DIAGNOSIS — Z8759 Personal history of other complications of pregnancy, childbirth and the puerperium: Secondary | ICD-10-CM

## 2014-05-13 DIAGNOSIS — O165 Unspecified maternal hypertension, complicating the puerperium: Secondary | ICD-10-CM

## 2014-05-13 DIAGNOSIS — Z3202 Encounter for pregnancy test, result negative: Secondary | ICD-10-CM

## 2014-05-13 DIAGNOSIS — Z8679 Personal history of other diseases of the circulatory system: Secondary | ICD-10-CM | POA: Insufficient documentation

## 2014-05-13 LAB — POCT URINE PREGNANCY: Preg Test, Ur: NEGATIVE

## 2014-05-13 MED ORDER — AMLODIPINE BESYLATE 5 MG PO TABS: 5.0000 mg | ORAL_TABLET | Freq: Every day | ORAL | Status: DC

## 2014-05-13 NOTE — Progress Notes (Signed)
Patient ID: Carrie Mckenzie, female   DOB: 11/15/1990, 23 y.o.   MRN: [id number] Subjective:    Carrie Sievertierra Lamountain is a 23 y.o. 13P2012 African American female who presents for a postpartum visit. She is 6 weeks postpartum following a spontaneous vaginal delivery at 39.2 gestational weeks. Anesthesia: epidural. I have fully reviewed the prenatal and intrapartum course. Postpartum course has been uncomplicated. Baby's course has been uncomplicated. Baby is feeding by breast x 2wks, now bottle. Bleeding x 4wks, then had heavy bleeding after sex thurs 10/1 that is now tapering off- no cramping. Bowel function is normal. Bladder function is normal. Patient is sexually active. Last sexual activity: 10/1. Contraception method is none, wants to start DewarSeasonique. Will not today d/t bp's and recent unprotected sex.  No h/o DVT/PE, CVA, MI, or migraines w/ aura. Smokes 1/2ppd.  Postpartum depression screening: equivocal, on zoloft 100mg  daily- doing well, denies SI/HI/II. Score 10.  Last pap 08/27/13 and was neg. Had 2 mildly elevated bp's 3rd trimester, had 1 dose methergine in hospital pp for trailing membranes and sbp went to 150s so she didn't receive anymore, bp was normal at d/c. Denies ha, scotomata, ruq/epigastric pain, n/v.    The following portions of the patient's history were reviewed and updated as appropriate: allergies, current medications, past medical history, past surgical history and problem list.  Review of Systems Pertinent items are noted in HPI.   Filed Vitals:   05/13/14 1103  BP: 168/92  Height: 5\' 5"  (1.651 m)  Weight: 148 lb (67.132 kg)   No LMP recorded.  Objective:   General:  alert, cooperative and no distress   Breasts:  deferred, no complaints  Lungs: clear to auscultation bilaterally  Heart:  regular rate and rhythm  Abdomen: soft, nontender   Vulva: normal  Vagina: normal vagina  Cervix:  closed  Corpus: Well-involuted  Adnexa:  Non-palpable  Rectal Exam: No  hemorrhoids       Extremities: no edema Assessment:   Postpartum exam 6 wks s/p SVD Bottlefeeding Depression screening Contraception counseling  Recent unprotected sex PP HTN Smoker Plan:  Rx norvasc 5mg  daily, to stop 2d prior to next visit Has home bp monitor- to call if bp's still elevated on meds Advised smoking cessation, discussed risks to her and infant, offered quitline [place]- declined Contraception: abstinence until after next visit Follow up in: 2 weeks for bp check, contraception initiation or earlier if needed  Marge DuncansBooker, Benjie Ricketson Randall CNM, [facility]WHNP-BC 05/13/2014 11:19 AM

## 2014-05-13 NOTE — Patient Instructions (Addendum)
NO SEX AT ALL UNTIL AFTER YOUR NEXT APPOINTMENT!!! Stop the blood pressure medicine (Norvasc) 2 days before your next appointment  Hypertension Hypertension, commonly called high blood pressure, is when the force of blood pumping through your arteries is too strong. Your arteries are the blood vessels that carry blood from your heart throughout your body. A blood pressure reading consists of a higher number over a lower number, such as 110/72. The higher number (systolic) is the pressure inside your arteries when your heart pumps. The lower number (diastolic) is the pressure inside your arteries when your heart relaxes. Ideally you want your blood pressure below 120/80. Hypertension forces your heart to work harder to pump blood. Your arteries may become narrow or stiff. Having hypertension puts you at risk for heart disease, stroke, and other problems.  RISK FACTORS Some risk factors for high blood pressure are controllable. Others are not.  Risk factors you cannot control include:   Race. You may be at higher risk if you are African American.  Age. Risk increases with age.  Gender. Men are at higher risk than women before age 54 years. After age 86, women are at higher risk than men. Risk factors you can control include:  Not getting enough exercise or physical activity.  Being overweight.  Getting too much fat, sugar, calories, or salt in your diet.  Drinking too much alcohol. SIGNS AND SYMPTOMS Hypertension does not usually cause signs or symptoms. Extremely high blood pressure (hypertensive crisis) may cause headache, anxiety, shortness of breath, and nosebleed. DIAGNOSIS  To check if you have hypertension, your health care provider will measure your blood pressure while you are seated, with your arm held at the level of your heart. It should be measured at least twice using the same arm. Certain conditions can cause a difference in blood pressure between your right and left arms. A  blood pressure reading that is higher than normal on one occasion does not mean that you need treatment. If one blood pressure reading is high, ask your health care provider about having it checked again. TREATMENT  Treating high blood pressure includes making lifestyle changes and possibly taking medicine. Living a healthy lifestyle can help lower high blood pressure. You may need to change some of your habits. Lifestyle changes may include:  Following the DASH diet. This diet is high in fruits, vegetables, and whole grains. It is low in salt, red meat, and added sugars.  Getting at least 2 hours of brisk physical activity every week.  Losing weight if necessary.  Not smoking.  Limiting alcoholic beverages.  Learning ways to reduce stress. If lifestyle changes are not enough to get your blood pressure under control, your health care provider may prescribe medicine. You may need to take more than one. Work closely with your health care provider to understand the risks and benefits. HOME CARE INSTRUCTIONS  Have your blood pressure rechecked as directed by your health care provider.   Take medicines only as directed by your health care provider. Follow the directions carefully. Blood pressure medicines must be taken as prescribed. The medicine does not work as well when you skip doses. Skipping doses also puts you at risk for problems.   Do not smoke.   Monitor your blood pressure at home as directed by your health care provider. SEEK MEDICAL CARE IF:   You think you are having a reaction to medicines taken.  You have recurrent headaches or feel dizzy.  You have swelling in  your ankles.  You have trouble with your vision. SEEK IMMEDIATE MEDICAL CARE IF:  You develop a severe headache or confusion.  You have unusual weakness, numbness, or feel faint.  You have severe chest or abdominal pain.  You vomit repeatedly.  You have trouble breathing. MAKE SURE YOU:    Understand these instructions.  Will watch your condition.  Will get help right away if you are not doing well or get worse. Document Released: 07/26/2005 Document Revised: 12/10/2013 Document Reviewed: 05/18/2013 [facility] Patient Information 2015 [place], [place]. This information is not intended to replace advice given to you by your health care provider. Make sure you discuss any questions you have with your health care provider.  Smoking Cessation, Tips for Success If you are ready to quit smoking, congratulations! You have chosen to help yourself be healthier. Cigarettes bring nicotine, tar, carbon monoxide, and other irritants into your body. Your lungs, heart, and blood vessels will be able to work better without these poisons. There are many different ways to quit smoking. Nicotine gum, nicotine patches, a nicotine inhaler, or nicotine nasal spray can help with physical craving. Hypnosis, support groups, and medicines help break the habit of smoking. WHAT THINGS CAN I DO TO MAKE QUITTING EASIER?  Here are some tips to help you quit for good:  Pick a date when you will quit smoking completely. Tell all of your friends and family about your plan to quit on that date.  Do not try to slowly cut down on the number of cigarettes you are smoking. Pick a quit date and quit smoking completely starting on that day.  Throw away all cigarettes.   Clean and remove all ashtrays from your home, work, and car.  On a card, write down your reasons for quitting. Carry the card with you and read it when you get the urge to smoke.  Cleanse your body of nicotine. Drink enough water and fluids to keep your urine clear or pale yellow. Do this after quitting to flush the nicotine from your body.  Learn to predict your moods. Do not let a bad situation be your excuse to have a cigarette. Some situations in your life might tempt you into wanting a cigarette.  Never have "just one" cigarette. It leads to  wanting another and another. Remind yourself of your decision to quit.  Change habits associated with smoking. If you smoked while driving or when feeling stressed, try other activities to replace smoking. Stand up when drinking your coffee. Brush your teeth after eating. Sit in a different chair when you read the paper. Avoid alcohol while trying to quit, and try to drink fewer caffeinated beverages. Alcohol and caffeine may urge you to smoke.  Avoid foods and drinks that can trigger a desire to smoke, such as sugary or spicy foods and alcohol.  Ask people who smoke not to smoke around you.  Have something planned to do right after eating or having a cup of coffee. For example, plan to take a walk or exercise.  Try a relaxation exercise to calm you down and decrease your stress. Remember, you may be tense and nervous for the first 2 weeks after you quit, but this will pass.  Find new activities to keep your hands busy. Play with a pen, coin, or rubber band. Doodle or draw things on paper.  Brush your teeth right after eating. This will help cut down on the craving for the taste of tobacco after meals. You can also  try mouthwash.   Use oral substitutes in place of cigarettes. Try using lemon drops, carrots, cinnamon sticks, or chewing gum. Keep them handy so they are available when you have the urge to smoke.  When you have the urge to smoke, try deep breathing.  Designate your home as a nonsmoking area.  If you are a heavy smoker, ask your health care provider about a prescription for nicotine chewing gum. It can ease your withdrawal from nicotine.  Reward yourself. Set aside the cigarette money you save and buy yourself something nice.  Look for support from others. Join a support group or smoking cessation program. Ask someone at home or at work to help you with your plan to quit smoking.  Always ask yourself, "Do I need this cigarette or is this just a reflex?" Tell yourself, "Today,  I choose not to smoke," or "I do not want to smoke." You are reminding yourself of your decision to quit.  Do not replace cigarette smoking with electronic cigarettes (commonly called e-cigarettes). The safety of e-cigarettes is unknown, and some may contain harmful chemicals.  If you relapse, do not give up! Plan ahead and think about what you will do the next time you get the urge to smoke. HOW WILL I FEEL WHEN I QUIT SMOKING? You may have symptoms of withdrawal because your body is used to nicotine (the addictive substance in cigarettes). You may crave cigarettes, be irritable, feel very hungry, cough often, get headaches, or have difficulty concentrating. The withdrawal symptoms are only temporary. They are strongest when you first quit but will go away within 10-14 days. When withdrawal symptoms occur, stay in control. Think about your reasons for quitting. Remind yourself that these are signs that your body is healing and getting used to being without cigarettes. Remember that withdrawal symptoms are easier to treat than the major diseases that smoking can cause.  Even after the withdrawal is over, expect periodic urges to smoke. However, these cravings are generally short lived and will go away whether you smoke or not. Do not smoke! WHAT RESOURCES ARE AVAILABLE TO HELP ME QUIT SMOKING? Your health care provider can direct you to community resources or hospitals for support, which may include:  Group support.  Education.  Hypnosis.  Therapy. Document Released: 04/23/2004 Document Revised: 12/10/2013 Document Reviewed: 01/11/2013 [facility] Patient Information 2015 [place]. This information is not intended to replace advice given to you by your health care provider. Make sure you discuss any questions you have with your health care provider.  Smoking Cessation Quitting smoking is important to your health and has many advantages. However, it is not always easy to quit since nicotine  is a very addictive drug. Oftentimes, people try 3 times or more before being able to quit. This document explains the best ways for you to prepare to quit smoking. Quitting takes hard work and a lot of effort, but you can do it. ADVANTAGES OF QUITTING SMOKING  You will live longer, feel better, and live better.  Your body will feel the impact of quitting smoking almost immediately.  Within 20 minutes, blood pressure decreases. Your pulse returns to its normal level.  After 8 hours, carbon monoxide levels in the blood return to normal. Your oxygen level increases.  After 24 hours, the chance of having a heart attack starts to decrease. Your breath, hair, and body stop smelling like smoke.  After 48 hours, damaged nerve endings begin to recover. Your sense of taste and smell  improve.  After 72 hours, the body is virtually free of nicotine. Your bronchial tubes relax and breathing becomes easier.  After 2 to 12 weeks, lungs can hold more air. Exercise becomes easier and circulation improves.  The risk of having a heart attack, stroke, cancer, or lung disease is greatly reduced.  After 1 year, the risk of coronary heart disease is cut in half.  After 5 years, the risk of stroke falls to the same as a nonsmoker.  After 10 years, the risk of lung cancer is cut in half and the risk of other cancers decreases significantly.  After 15 years, the risk of coronary heart disease drops, usually to the level of a nonsmoker.  If you are pregnant, quitting smoking will improve your chances of having a healthy baby.  The people you live with, especially any children, will be healthier.  You will have extra money to spend on things other than cigarettes. QUESTIONS TO THINK ABOUT BEFORE ATTEMPTING TO QUIT You may want to talk about your answers with your health care provider.  Why do you want to quit?  If you tried to quit in the past, what helped and what did not?  What will be the most  difficult situations for you after you quit? How will you plan to handle them?  Who can help you through the tough times? Your family? Friends? A health care provider?  What pleasures do you get from smoking? What ways can you still get pleasure if you quit? Here are some questions to ask your health care provider:  How can you help me to be successful at quitting?  What medicine do you think would be best for me and how should I take it?  What should I do if I need more help?  What is smoking withdrawal like? How can I get information on withdrawal? GET READY  Set a quit date.  Change your environment by getting rid of all cigarettes, ashtrays, matches, and lighters in your home, car, or work. Do not let people smoke in your home.  Review your past attempts to quit. Think about what worked and what did not. GET SUPPORT AND ENCOURAGEMENT You have a better chance of being successful if you have help. You can get support in many ways.  Tell your family, friends, and coworkers that you are going to quit and need their support. Ask them not to smoke around you.  Get individual, group, or telephone counseling and support. Programs are available at [employer] and health centers. Call your local health department for information about programs in your area.  Spiritual beliefs and practices may help some smokers quit.  Download a "quit meter" on your computer to keep track of quit statistics, such as how long you have gone without smoking, cigarettes not smoked, and money saved.  Get a self-help book about quitting smoking and staying off tobacco. LEARN NEW SKILLS AND BEHAVIORS  Distract yourself from urges to smoke. Talk to someone, go for a walk, or occupy your time with a task.  Change your normal routine. Take a different route to work. Drink tea instead of coffee. Eat breakfast in a different place.  Reduce your stress. Take a hot bath, exercise, or read a book.  Plan  something enjoyable to do every day. Reward yourself for not smoking.  Explore interactive web-based programs that specialize in helping you quit. GET MEDICINE AND USE IT CORRECTLY Medicines can help you stop smoking and decrease the  urge to smoke. Combining medicine with the above behavioral methods and support can greatly increase your chances of successfully quitting smoking.  Nicotine replacement therapy helps deliver nicotine to your body without the negative effects and risks of smoking. Nicotine replacement therapy includes nicotine gum, lozenges, inhalers, nasal sprays, and skin patches. Some may be available over-the-counter and others require a prescription.  Antidepressant medicine helps people abstain from smoking, but how this works is unknown. This medicine is available by prescription.  Nicotinic receptor partial agonist medicine simulates the effect of nicotine in your brain. This medicine is available by prescription. Ask your health care provider for advice about which medicines to use and how to use them based on your health history. Your health care provider will tell you what side effects to look out for if you choose to be on a medicine or therapy. Carefully read the information on the package. Do not use any other product containing nicotine while using a nicotine replacement product.  RELAPSE OR DIFFICULT SITUATIONS Most relapses occur within the first 3 months after quitting. Do not be discouraged if you start smoking again. Remember, most people try several times before finally quitting. You may have symptoms of withdrawal because your body is used to nicotine. You may crave cigarettes, be irritable, feel very hungry, cough often, get headaches, or have difficulty concentrating. The withdrawal symptoms are only temporary. They are strongest when you first quit, but they will go away within 10-14 days. To reduce the chances of relapse, try to:  Avoid drinking alcohol.  Drinking lowers your chances of successfully quitting.  Reduce the amount of caffeine you consume. Once you quit smoking, the amount of caffeine in your body increases and can give you symptoms, such as a rapid heartbeat, sweating, and anxiety.  Avoid smokers because they can make you want to smoke.  Do not let weight gain distract you. Many smokers will gain weight when they quit, usually less than 10 pounds. Eat a healthy diet and stay active. You can always lose the weight gained after you quit.  Find ways to improve your mood other than smoking. FOR MORE INFORMATION  www.smokefree.gov  Document Released: 07/20/2001 Document Revised: 12/10/2013 Document Reviewed: 11/04/2011 [facility] Patient Information 2015 [place], [place]. This information is not intended to replace advice given to you by your health care provider. Make sure you discuss any questions you have with your health care provider.

## 2014-05-24 ENCOUNTER — Other Ambulatory Visit: Payer: Self-pay

## 2014-05-30 ENCOUNTER — Encounter: Payer: Self-pay | Admitting: *Deleted

## 2014-05-30 ENCOUNTER — Telehealth: Payer: Self-pay | Admitting: Advanced Practice Midwife

## 2014-05-30 ENCOUNTER — Ambulatory Visit: Payer: Medicaid Other | Admitting: Advanced Practice Midwife

## 2014-05-31 MED ORDER — NORETHINDRONE 0.35 MG PO TABS: 1.0000 | ORAL_TABLET | Freq: Every day | ORAL | Status: DC

## 2014-05-31 NOTE — Telephone Encounter (Signed)
Pt requesting RX for Micronor.

## 2014-05-31 NOTE — Telephone Encounter (Signed)
[facility] at [facility] [place]

## 2014-06-05 ENCOUNTER — Encounter: Payer: Self-pay | Admitting: Advanced Practice Midwife

## 2014-06-05 NOTE — Progress Notes (Signed)
  Pt works at [employer].  Needs F/U for GHTN.  B/P (2 days off meds) are 142/83, 136/83, 131/99 and 149/81.  Will continue BP meds for another month, RX micronior for now with hopes of transititioning off meds and to a COC.

## 2014-06-10 ENCOUNTER — Encounter: Payer: Self-pay | Admitting: Women's Health

## 2014-07-08 ENCOUNTER — Telehealth: Payer: Self-pay | Admitting: Advanced Practice Midwife

## 2014-07-08 NOTE — Telephone Encounter (Signed)
Pt states she was told to stop BP medication when baby turned 12 weeks and monitor BP, she states she stopped the med about 2 weeks ago and her BP has been in normal range every time she has checked it, this mornings reading was 118/73.  [facility]  I informed pt Drenda FreezeFran is out of the office until Wednesday, pt states she has enough of her current medication

## 2014-07-10 MED ORDER — LEVONORGEST-ETH ESTRAD 91-DAY 0.15-0.03 &0.01 MG PO TABS: 1.0000 | ORAL_TABLET | Freq: Every day | ORAL | Status: DC

## 2014-07-10 NOTE — Telephone Encounter (Signed)
Off bp meds, bp normal. Will change pills from Micronoir to seasonique.

## 2014-07-15 NOTE — Telephone Encounter (Signed)
Pt informed new RX sent to pharmacy.

## 2015-01-14 ENCOUNTER — Encounter (HOSPITAL_COMMUNITY): Payer: Self-pay | Admitting: Emergency Medicine

## 2015-01-14 ENCOUNTER — Emergency Department (HOSPITAL_COMMUNITY)
Admission: EM | Admit: 2015-01-14 | Discharge: 2015-01-14 | Disposition: A | Discharge status: Home / Self Care | Payer: Medicaid Other | Attending: Emergency Medicine | Admitting: Emergency Medicine

## 2015-01-14 DIAGNOSIS — X58XXXA Exposure to other specified factors, initial encounter: Secondary | ICD-10-CM | POA: Insufficient documentation

## 2015-01-14 DIAGNOSIS — Y998 Other external cause status: Secondary | ICD-10-CM | POA: Insufficient documentation

## 2015-01-14 DIAGNOSIS — Y9389 Activity, other specified: Secondary | ICD-10-CM | POA: Insufficient documentation

## 2015-01-14 DIAGNOSIS — Y9289 Other specified places as the place of occurrence of the external cause: Secondary | ICD-10-CM | POA: Insufficient documentation

## 2015-01-14 DIAGNOSIS — Z79899 Other long term (current) drug therapy: Secondary | ICD-10-CM | POA: Insufficient documentation

## 2015-01-14 DIAGNOSIS — F419 Anxiety disorder, unspecified: Secondary | ICD-10-CM | POA: Insufficient documentation

## 2015-01-14 DIAGNOSIS — Z8742 Personal history of other diseases of the female genital tract: Secondary | ICD-10-CM | POA: Insufficient documentation

## 2015-01-14 DIAGNOSIS — Z720 Tobacco use: Secondary | ICD-10-CM | POA: Insufficient documentation

## 2015-01-14 DIAGNOSIS — T7840XA Allergy, unspecified, initial encounter: Secondary | ICD-10-CM | POA: Insufficient documentation

## 2015-01-14 MED ORDER — FAMOTIDINE IN NACL 20-0.9 MG/50ML-% IV SOLN
20.0000 mg | Freq: Once | INTRAVENOUS | Status: AC
  Administered 2015-01-14: 20 mg via INTRAVENOUS
  Filled 2015-01-14: qty 50

## 2015-01-14 MED ORDER — SODIUM CHLORIDE 0.9 % IV SOLN
Freq: Once | INTRAVENOUS | Status: AC
  Administered 2015-01-14: 10:00:00 via INTRAVENOUS

## 2015-01-14 MED ORDER — PREDNISONE 10 MG PO TABS: ORAL_TABLET | ORAL | Status: DC

## 2015-01-14 MED ORDER — METHYLPREDNISOLONE SODIUM SUCC 125 MG IJ SOLR
125.0000 mg | Freq: Once | INTRAMUSCULAR | Status: AC
  Administered 2015-01-14: 125 mg via INTRAVENOUS
  Filled 2015-01-14: qty 2

## 2015-01-14 MED ORDER — DIPHENHYDRAMINE HCL 50 MG/ML IJ SOLN
50.0000 mg | Freq: Once | INTRAMUSCULAR | Status: AC
  Administered 2015-01-14: 50 mg via INTRAVENOUS
  Filled 2015-01-14: qty 1

## 2015-01-14 NOTE — ED Notes (Signed)
Notice rash to right wrist and right wrist.  [facility].  Current rash has spread to trunk face, arms and breast.  Denies pain but areas are burning and itching.

## 2015-01-14 NOTE — ED Provider Notes (Signed)
CSN: [id number]     Arrival date & time 01/14/15  0913 History   First MD Initiated Contact with Patient 01/14/15 [phone]     Chief Complaint  Patient presents with  . Rash     (Consider location/radiation/quality/duration/timing/severity/associated sxs/prior Treatment) Patient is a 24 y.o. female presenting with rash. The history is provided by the patient. No language interpreter was used.  Rash Location:  Full body Quality: blistering and itchiness   Severity:  Moderate Onset quality:  Gradual Timing:  Constant Progression:  Worsening Chronicity:  New Relieved by:  Nothing Worsened by:  Nothing tried Ineffective treatments:  None tried pt was diagnosed with poison ivy.  Pt given prednisone by [facility].  Pt finished,  Now rash is worse.  Past Medical History  Diagnosis Date  . Anxiety   . Vaginal discharge 12/17/2013  . BV (bacterial vaginosis) 12/17/2013  . Ovarian cyst    Past Surgical History  Procedure Laterality Date  . Cholecystectomy    . Wisdom tooth extraction     Family History  Problem Relation Age of Onset  . Diabetes Mother   . Thyroid disease Mother     had thyroid removed  . Hypertension Father   . Cancer Father     prostate  . Diabetes Maternal Aunt   . Diabetes Maternal Uncle   . Cancer Maternal Grandmother     ovarian, uterine  . Diabetes Maternal Aunt   . Diabetes Maternal Aunt   . Diabetes Maternal Uncle   . Diabetes Maternal Uncle   . Diabetes Maternal Uncle   . Diabetes Maternal Uncle   . Diabetes Maternal Uncle   . Diabetes Maternal Uncle    History  Substance Use Topics  . Smoking status: Current Every Day Smoker -- 0.50 packs/day for 10 years    Types: Cigarettes  . Smokeless tobacco: Never Used  . Alcohol Use: No   OB History    Gravida Para Term Preterm AB TAB SAB Ectopic Multiple Living   3 2 2  0 1 0 1 0 0 2     Review of Systems  Skin: Positive for rash.  All other systems reviewed and are negative.     Allergies   Review of patient's allergies indicates no known allergies.  Home Medications   Prior to Admission medications   Medication Sig Start Date End Date Taking? Authorizing Provider  diphenhydrAMINE (BENADRYL) 25 MG tablet Take 25 mg by mouth at bedtime as needed for itching.   Yes Historical Provider, MD  hydrOXYzine (ATARAX/VISTARIL) 25 MG tablet Take 25 mg by mouth 3 (three) times daily as needed for anxiety or itching.   Yes Historical Provider, MD  predniSONE (STERAPRED UNI-PAK 21 TAB) 10 MG (21) TBPK tablet Take 10 mg by mouth daily.   Yes Historical Provider, MD  amLODipine (NORVASC) 5 MG tablet Take 1 tablet (5 mg total) by mouth daily. Patient not taking: Reported on 01/14/2015 05/13/14   Cheral MarkerKimberly R Booker, CNM  docusate sodium (COLACE) 100 MG capsule Take 1 capsule (100 mg total) by mouth 2 (two) times daily. Patient not taking: Reported on 01/14/2015 04/03/14   Jamal CollinJames R Joyner, MD  ibuprofen (MOTRIN IB) 400 MG tablet Take 1 tablet (400 mg total) by mouth every 6 (six) hours as needed for mild pain. Patient not taking: Reported on 01/14/2015 04/03/14   Jamal CollinJames R Joyner, MD  Levonorgestrel-Ethinyl Estradiol (AMETHIA,CAMRESE) 0.15-0.03 &0.01 MG tablet Take 1 tablet by mouth daily. Patient not taking: Reported on 01/14/2015 07/10/14  Jacklyn Shell, CNM  norethindrone (MICRONOR,CAMILA,ERRIN) 0.35 MG tablet Take 1 tablet (0.35 mg total) by mouth daily. Patient not taking: Reported on 01/14/2015 05/31/14   Adline Potter, NP  Prenatal Vit-Fe Fumarate-FA (PRENATAL MULTIVITAMIN) TABS tablet Take 1 tablet by mouth daily at 12 noon. Patient not taking: Reported on 01/14/2015 04/03/14   Jamal Collin, MD   BP 153/93 mmHg  Pulse 86  Temp(Src) 98.4 F (36.9 C) (Oral)  Resp 18  Ht  (1.651 m)  Wt 163 lb (73.936 kg)  BMI 27.12 kg/m2  SpO2 100%  LMP 12/23/2014 Physical Exam  Constitutional: She is oriented to person, place, and time. She appears well-developed and well-nourished.  HENT:   Head: Normocephalic.  Eyes: EOM are normal.  Neck: Normal range of motion.  Pulmonary/Chest: Effort normal.  Abdominal: She exhibits no distension.  Musculoskeletal: Normal range of motion.  Neurological: She is alert and oriented to person, place, and time.  Skin:  Linear red raised areas,  (looks like severe poison ivy)  Psychiatric: She has a normal mood and affect.  Nursing note and vitals reviewed.   ED Course  Procedures (including critical care time) Labs Review Labs Reviewed - No data to display  Imaging Review No results found.   EKG Interpretation None      MDM  I suspect pt is having rebound poison ivy symptoms.     Final diagnoses:  Allergic reaction, initial encounter        Prednisone 12 day taper benadryl    Elson Areas, PA-C 01/14/15 1212  Gilda Crease, MD 01/14/15 1320

## 2015-01-18 ENCOUNTER — Encounter (HOSPITAL_COMMUNITY): Payer: Self-pay | Admitting: *Deleted

## 2015-01-18 ENCOUNTER — Emergency Department (HOSPITAL_COMMUNITY)
Admission: EM | Admit: 2015-01-18 | Discharge: 2015-01-18 | Disposition: A | Discharge status: Home / Self Care | Payer: Medicaid Other | Attending: Emergency Medicine | Admitting: Emergency Medicine

## 2015-01-18 DIAGNOSIS — Z8742 Personal history of other diseases of the female genital tract: Secondary | ICD-10-CM | POA: Insufficient documentation

## 2015-01-18 DIAGNOSIS — Z720 Tobacco use: Secondary | ICD-10-CM | POA: Insufficient documentation

## 2015-01-18 DIAGNOSIS — R21 Rash and other nonspecific skin eruption: Secondary | ICD-10-CM | POA: Insufficient documentation

## 2015-01-18 DIAGNOSIS — Z3202 Encounter for pregnancy test, result negative: Secondary | ICD-10-CM | POA: Insufficient documentation

## 2015-01-18 DIAGNOSIS — Z79899 Other long term (current) drug therapy: Secondary | ICD-10-CM | POA: Insufficient documentation

## 2015-01-18 DIAGNOSIS — R42 Dizziness and giddiness: Secondary | ICD-10-CM | POA: Insufficient documentation

## 2015-01-18 LAB — POC URINE PREG, ED: Preg Test, Ur: NEGATIVE

## 2015-01-18 MED ORDER — DOXYCYCLINE HYCLATE 100 MG PO TABS
100.0000 mg | ORAL_TABLET | Freq: Once | ORAL | Status: AC
  Administered 2015-01-18: 100 mg via ORAL
  Filled 2015-01-18: qty 1

## 2015-01-18 MED ORDER — PREDNISONE 10 MG PO TABS: 40.0000 mg | ORAL_TABLET | Freq: Every day | ORAL | Status: DC

## 2015-01-18 MED ORDER — DOXYCYCLINE HYCLATE 100 MG PO CAPS: 100.0000 mg | ORAL_CAPSULE | Freq: Two times a day (BID) | ORAL | Status: DC

## 2015-01-18 MED ORDER — DOXYCYCLINE HYCLATE 100 MG PO TABS: 100.0000 mg | ORAL_TABLET | Freq: Once | ORAL | Status: DC

## 2015-01-18 NOTE — Discharge Instructions (Signed)
Resource guide provided below to help you find a dermatologist. Also an option would be to be evaluated in emergency department at [facility] perhaps they can refer you to dermatology clinic. Exact cause of the rash is not clear. There may be a area of secondary infection cellulitis so recommend taking the anabolic as directed and continue with sterilely. Return for any new or worse symptoms. Preg test was negative.

## 2015-01-18 NOTE — ED Notes (Signed)
Pt has been seen for a rash 2 times (onece at [facility] and once here). Pt states she has a rash that is on her her hands, wrist, arms, face, and on the sides of her abdomen. Pt states she began feeling dizzy yesterday.

## 2015-01-23 NOTE — ED Provider Notes (Signed)
CSN: [id number]     Arrival date & time 01/18/15  1749 History   First MD Initiated Contact with Patient 01/18/15 1852     Chief Complaint  Patient presents with  . Rash     (Consider location/radiation/quality/duration/timing/severity/associated sxs/prior Treatment) Patient is a 24 y.o. female presenting with rash. The history is provided by the patient.  Rash Associated symptoms: no abdominal pain, no fever, no headaches, no myalgias, no nausea, no shortness of breath and not vomiting    patient would complain of rash has been seen twice for this once in the bottom ones here. Patient feels as if of it is not getting any better started with some dizziness symptoms yesterday. Patient states that the rash is all over. Patient treated with prednisone and Benadryl at last visit.  Past Medical History  Diagnosis Date  . Anxiety   . Vaginal discharge 12/17/2013  . BV (bacterial vaginosis) 12/17/2013  . Ovarian cyst    Past Surgical History  Procedure Laterality Date  . Cholecystectomy    . Wisdom tooth extraction     Family History  Problem Relation Age of Onset  . Diabetes Mother   . Thyroid disease Mother     had thyroid removed  . Hypertension Father   . Cancer Father     prostate  . Diabetes Maternal Aunt   . Diabetes Maternal Uncle   . Cancer Maternal Grandmother     ovarian, uterine  . Diabetes Maternal Aunt   . Diabetes Maternal Aunt   . Diabetes Maternal Uncle   . Diabetes Maternal Uncle   . Diabetes Maternal Uncle   . Diabetes Maternal Uncle   . Diabetes Maternal Uncle   . Diabetes Maternal Uncle    History  Substance Use Topics  . Smoking status: Current Every Day Smoker -- 0.50 packs/day for 10 years    Types: Cigarettes  . Smokeless tobacco: Never Used  . Alcohol Use: No   OB History    Gravida Para Term Preterm AB TAB SAB Ectopic Multiple Living   0 1 0 1 0 0 2     Review of Systems  Constitutional: Negative for fever.  HENT: Negative for  congestion.   Respiratory: Negative for shortness of breath.   Cardiovascular: Negative for chest pain.  Gastrointestinal: Negative for nausea, vomiting and abdominal pain.  Genitourinary: Negative for dysuria.  Musculoskeletal: Negative for myalgias and back pain.  Skin: Positive for rash.  Neurological: Positive for dizziness. Negative for headaches.  Hematological: Does not bruise/bleed easily.  Psychiatric/Behavioral: Negative for confusion.      Allergies  Review of patient's allergies indicates no known allergies.  Home Medications   Prior to Admission medications   Medication Sig Start Date End Date Taking? Authorizing Provider  diphenhydrAMINE (BENADRYL) 25 MG tablet Take 25 mg by mouth at bedtime as needed for itching.   Yes Historical Provider, MD  hydrOXYzine (ATARAX/VISTARIL) 25 MG tablet Take 25 mg by mouth 3 (three) times daily as needed for anxiety or itching.   Yes Historical Provider, MD  predniSONE (DELTASONE) 10 MG tablet 6,6,5,5,4,4,3,3,2,2,1,1 taper Patient taking differently: Take 10-20 mg by mouth See admin instructions. Tapered course. To take two tablets today and one tablet tomorrow and discontinue 01/14/15  Yes Elson Areas, PA-C  amLODipine (NORVASC) 5 MG tablet Take 1 tablet (5 mg total) by mouth daily. Patient not taking: Reported on 01/14/2015 05/13/14   Cheral Marker, CNM  docusate sodium (COLACE) 100 MG  capsule Take 1 capsule (100 mg total) by mouth 2 (two) times daily. Patient not taking: Reported on 01/14/2015 04/03/14   Jamal Collin, MD  doxycycline (VIBRAMYCIN) 100 MG capsule Take 1 capsule (100 mg total) by mouth 2 (two) times daily. 01/18/15   Vanetta Mulders, MD  ibuprofen (MOTRIN IB) 400 MG tablet Take 1 tablet (400 mg total) by mouth every 6 (six) hours as needed for mild pain. Patient not taking: Reported on 01/14/2015 04/03/14   Jamal Collin, MD  Levonorgestrel-Ethinyl Estradiol (AMETHIA,CAMRESE) 0.15-0.03 &0.01 MG tablet Take 1 tablet by  mouth daily. Patient not taking: Reported on 01/14/2015 07/10/14   Jacklyn Shell, CNM  norethindrone (MICRONOR,CAMILA,ERRIN) 0.35 MG tablet Take 1 tablet (0.35 mg total) by mouth daily. Patient not taking: Reported on 01/14/2015 05/31/14   Adline Potter, NP  predniSONE (DELTASONE) 10 MG tablet Take 4 tablets (40 mg total) by mouth daily. 01/18/15   Vanetta Mulders, MD  Prenatal Vit-Fe Fumarate-FA (PRENATAL MULTIVITAMIN) TABS tablet Take 1 tablet by mouth daily at 12 noon. Patient not taking: Reported on 01/14/2015 04/03/14   Jamal Collin, MD   BP 138/72 mmHg  Pulse 88  Temp(Src) 98.9 F (37.2 C) (Oral)  Resp 16  Ht 5\' 5"  (1.651 m)  Wt 163 lb (73.936 kg)  BMI 27.12 kg/m2  SpO2 100%  LMP 12/23/2014 Physical Exam  Constitutional: She is oriented to person, place, and time. She appears well-developed and well-nourished. No distress.  HENT:  Head: Normocephalic and atraumatic.  Eyes: Conjunctivae and EOM are normal. Pupils are equal, round, and reactive to light.  Neck: Normal range of motion.  Cardiovascular: Normal rate and regular rhythm.   No murmur heard. Pulmonary/Chest: Effort normal and breath sounds normal.  Abdominal: Soft. Bowel sounds are normal. There is no tenderness.  Neurological: She is alert and oriented to person, place, and time. No cranial nerve deficit. Coordination normal.  Skin: Skin is warm. Rash noted. There is erythema.  Linear somewhat fascicular type rash more prominent on the lower extremities. Suggestive of contact dermatitis with a large based area of erythema around it.  Nursing note and vitals reviewed.   ED Course  Procedures (including critical care time) Labs Review Labs Reviewed  POC URINE PREG, ED    Imaging Review No results found.   EKG Interpretation None      MDM   Final diagnoses:  Rash    Rash may have a component of some cellulitis. There is a vesicular component to it. Seems to be more prominent at the lower  extremity. There could be a component of cellulitis.. Patient doesn't seem to fit a contact dermatitis. Will treat with steroids and anti-biotics.  Patient given referral information to follow-up with dermatology also some suggestions to follow-up at [facility] if unable to get a local dermatologist. Patient nontoxic no acute distress.    Vanetta Mulders, MD 01/23/15 [phone]

## 2015-07-31 ENCOUNTER — Emergency Department (HOSPITAL_COMMUNITY)
Admission: EM | Admit: 2015-07-31 | Discharge: 2015-07-31 | Disposition: A | Discharge status: Home / Self Care | Payer: Medicaid Other | Attending: Emergency Medicine | Admitting: Emergency Medicine

## 2015-07-31 ENCOUNTER — Encounter (HOSPITAL_COMMUNITY): Payer: Self-pay | Admitting: *Deleted

## 2015-07-31 DIAGNOSIS — Z793 Long term (current) use of hormonal contraceptives: Secondary | ICD-10-CM | POA: Insufficient documentation

## 2015-07-31 DIAGNOSIS — F419 Anxiety disorder, unspecified: Secondary | ICD-10-CM | POA: Insufficient documentation

## 2015-07-31 DIAGNOSIS — Z8742 Personal history of other diseases of the female genital tract: Secondary | ICD-10-CM | POA: Insufficient documentation

## 2015-07-31 DIAGNOSIS — J069 Acute upper respiratory infection, unspecified: Secondary | ICD-10-CM | POA: Insufficient documentation

## 2015-07-31 DIAGNOSIS — J029 Acute pharyngitis, unspecified: Secondary | ICD-10-CM

## 2015-07-31 DIAGNOSIS — R630 Anorexia: Secondary | ICD-10-CM | POA: Insufficient documentation

## 2015-07-31 DIAGNOSIS — F17210 Nicotine dependence, cigarettes, uncomplicated: Secondary | ICD-10-CM | POA: Insufficient documentation

## 2015-07-31 DIAGNOSIS — Z7952 Long term (current) use of systemic steroids: Secondary | ICD-10-CM | POA: Insufficient documentation

## 2015-07-31 DIAGNOSIS — Z792 Long term (current) use of antibiotics: Secondary | ICD-10-CM | POA: Insufficient documentation

## 2015-07-31 DIAGNOSIS — H9203 Otalgia, bilateral: Secondary | ICD-10-CM | POA: Insufficient documentation

## 2015-07-31 MED ORDER — HYDROCODONE-ACETAMINOPHEN 5-325 MG PO TABS: 1.0000 | ORAL_TABLET | ORAL | Status: DC | PRN

## 2015-07-31 MED ORDER — AMOXICILLIN 500 MG PO CAPS: 500.0000 mg | ORAL_CAPSULE | Freq: Three times a day (TID) | ORAL | Status: DC

## 2015-07-31 MED ORDER — AMOXICILLIN 250 MG PO CAPS
500.0000 mg | ORAL_CAPSULE | Freq: Once | ORAL | Status: AC
  Administered 2015-07-31: 500 mg via ORAL
  Filled 2015-07-31: qty 2

## 2015-07-31 MED ORDER — IBUPROFEN 800 MG PO TABS
800.0000 mg | ORAL_TABLET | Freq: Once | ORAL | Status: AC
  Administered 2015-07-31: 800 mg via ORAL
  Filled 2015-07-31: qty 1

## 2015-07-31 MED ORDER — PROMETHAZINE HCL 12.5 MG PO TABS
12.5000 mg | ORAL_TABLET | Freq: Once | ORAL | Status: AC
Start: 2015-07-31 — End: 2015-07-31
  Administered 2015-07-31: 12.5 mg via ORAL
  Filled 2015-07-31: qty 1

## 2015-07-31 MED ORDER — METHYLPREDNISOLONE SODIUM SUCC 125 MG IJ SOLR
125.0000 mg | Freq: Once | INTRAMUSCULAR | Status: AC
  Administered 2015-07-31: 125 mg via INTRAMUSCULAR
  Filled 2015-07-31: qty 2

## 2015-07-31 MED ORDER — HYDROCODONE-ACETAMINOPHEN 5-325 MG PO TABS
1.0000 | ORAL_TABLET | Freq: Once | ORAL | Status: AC
  Administered 2015-07-31: 1 via ORAL
  Filled 2015-07-31: qty 1

## 2015-07-31 MED ORDER — DEXAMETHASONE 4 MG PO TABS: 4.0000 mg | ORAL_TABLET | Freq: Two times a day (BID) | ORAL | Status: DC

## 2015-07-31 NOTE — Discharge Instructions (Signed)
Please use a mask until symptoms have resolved. Please wash hands frequently. Please do not share eating utensils. Use Amoxil 3 times daily, and Decadron 2 times daily with food until all taken. Use Tylenol or ibuprofen for mild pain or fever. Use Norco for more severe pain. Norco may cause drowsiness, please use this medication with caution. Pharyngitis Pharyngitis is a sore throat (pharynx). There is redness, pain, and swelling of your throat. HOME CARE   Drink enough fluids to keep your pee (urine) clear or pale yellow.  Only take medicine as told by your doctor.  You may get sick again if you do not take medicine as told. Finish your medicines, even if you start to feel better.  Do not take aspirin.  Rest.  Rinse your mouth (gargle) with salt water ( tsp of salt per 1 qt of water) every 1-2 hours. This will help the pain.  If you are not at risk for choking, you can suck on hard candy or sore throat lozenges. GET HELP IF:  You have large, tender lumps on your neck.  You have a rash.  You cough up green, yellow-brown, or bloody spit. GET HELP RIGHT AWAY IF:   You have a stiff neck.  You drool or cannot swallow liquids.  You throw up (vomit) or are not able to keep medicine or liquids down.  You have very bad pain that does not go away with medicine.  You have problems breathing (not from a stuffy nose). MAKE SURE YOU:   Understand these instructions.  Will watch your condition.  Will get help right away if you are not doing well or get worse.   This information is not intended to replace advice given to you by your health care provider. Make sure you discuss any questions you have with your health care provider.   Document Released: 01/12/2008 Document Revised: 05/16/2013 Document Reviewed: 04/02/2013 Elsevier Interactive Patient Education [employer]2016 Elsevier Inc.

## 2015-07-31 NOTE — ED Notes (Addendum)
Pt states sore throat, bilateral ear pain and facial pain since yesterday. Pt was seen by PCP and had strep test which was negative. Pt was told that it was a viral illness but pt states she feels horrible, tearful at triage. Pt denies V/D. Tonsil are red and swollen with exudate to the right

## 2015-07-31 NOTE — ED Provider Notes (Signed)
CSN: [id number]     Arrival date & time 07/31/15  1511 History   First MD Initiated Contact with Patient 07/31/15 1605     Chief Complaint  Patient presents with  . Sore Throat     (Consider location/radiation/quality/duration/timing/severity/associated sxs/prior Treatment) HPI Comments: Patient is a 24 year old female who presents to the emergency department with complaint of sore throat and body aches. The patient states that for the past 23 days she's been having sore throat and bilateral ear pain along with some facial pain. She states she was seen by her primary physician had a strep test done which was negative. She is unsure of the temperature that she's been having, but states she's been having chills. She also complains of headache, body aches, and generally feeling "horrible". She is able to get liquids down, but states it's uncomfortable. She denies diabetes. She denies any medical conditions that would interfere with her immune system. She has not traveled out of the country recently.  The history is provided by the patient.    Past Medical History  Diagnosis Date  . Anxiety   . Vaginal discharge 12/17/2013  . BV (bacterial vaginosis) 12/17/2013  . Ovarian cyst    Past Surgical History  Procedure Laterality Date  . Cholecystectomy    . Wisdom tooth extraction     Family History  Problem Relation Age of Onset  . Diabetes Mother   . Thyroid disease Mother     had thyroid removed  . Hypertension Father   . Cancer Father     prostate  . Diabetes Maternal Aunt   . Diabetes Maternal Uncle   . Cancer Maternal Grandmother     ovarian, uterine  . Diabetes Maternal Aunt   . Diabetes Maternal Aunt   . Diabetes Maternal Uncle   . Diabetes Maternal Uncle   . Diabetes Maternal Uncle   . Diabetes Maternal Uncle   . Diabetes Maternal Uncle   . Diabetes Maternal Uncle    Social History  Substance Use Topics  . Smoking status: Current Every Day Smoker -- 0.50 packs/day for  10 years    Types: Cigarettes  . Smokeless tobacco: Never Used  . Alcohol Use: No   OB History    Gravida Para Term Preterm AB TAB SAB Ectopic Multiple Living   3 2 2  0 1 0 1 0 0 2     Review of Systems  Constitutional: Positive for chills, activity change, appetite change and fatigue.  HENT: Positive for sore throat and trouble swallowing.   Skin: Negative for rash.  All other systems reviewed and are negative.     Allergies  Review of patient's allergies indicates no known allergies.  Home Medications   Prior to Admission medications   Medication Sig Start Date End Date Taking? Authorizing Provider  amLODipine (NORVASC) 5 MG tablet Take 1 tablet (5 mg total) by mouth daily. Patient not taking: Reported on 01/14/2015 05/13/14   Cheral MarkerKimberly R Booker, CNM  diphenhydrAMINE (BENADRYL) 25 MG tablet Take 25 mg by mouth at bedtime as needed for itching.    Historical Provider, MD  docusate sodium (COLACE) 100 MG capsule Take 1 capsule (100 mg total) by mouth 2 (two) times daily. Patient not taking: Reported on 01/14/2015 04/03/14   Jamal CollinJames R Joyner, MD  doxycycline (VIBRAMYCIN) 100 MG capsule Take 1 capsule (100 mg total) by mouth 2 (two) times daily. 01/18/15   Vanetta MuldersScott Zackowski, MD  hydrOXYzine (ATARAX/VISTARIL) 25 MG tablet Take 25 mg by mouth  3 (three) times daily as needed for anxiety or itching.    Historical Provider, MD  ibuprofen (MOTRIN IB) 400 MG tablet Take 1 tablet (400 mg total) by mouth every 6 (six) hours as needed for mild pain. Patient not taking: Reported on 01/14/2015 04/03/14   Jamal Collin, MD  Levonorgestrel-Ethinyl Estradiol (AMETHIA,CAMRESE) 0.15-0.03 &0.01 MG tablet Take 1 tablet by mouth daily. Patient not taking: Reported on 01/14/2015 07/10/14   Jacklyn Shell, CNM  norethindrone (MICRONOR,CAMILA,ERRIN) 0.35 MG tablet Take 1 tablet (0.35 mg total) by mouth daily. Patient not taking: Reported on 01/14/2015 05/31/14   Adline Potter, NP  predniSONE (DELTASONE) 10  MG tablet 6,6,5,5,4,4,3,3,2,2,1,1 taper Patient taking differently: Take 10-20 mg by mouth See admin instructions. Tapered course. To take two tablets today and one tablet tomorrow and discontinue 01/14/15   Elson Areas, PA-C  predniSONE (DELTASONE) 10 MG tablet Take 4 tablets (40 mg total) by mouth daily. 01/18/15   Vanetta Mulders, MD  Prenatal Vit-Fe Fumarate-FA (PRENATAL MULTIVITAMIN) TABS tablet Take 1 tablet by mouth daily at 12 noon. Patient not taking: Reported on 01/14/2015 04/03/14   Jamal Collin, MD   BP 126/67 mmHg  Pulse 92  Temp(Src) 99 F (37.2 C) (Oral)  Resp 18  Ht  (1.676 m)  Wt 79.379 kg  BMI 28.26 kg/m2  SpO2 100%  LMP 07/30/2015 Physical Exam  Constitutional: She is oriented to person, place, and time. She appears well-developed and well-nourished.  Non-toxic appearance.  HENT:  Head: Normocephalic.  Right Ear: Tympanic membrane and external ear normal.  Left Ear: Tympanic membrane and external ear normal.  Mouth/Throat: Uvula is midline. No trismus in the jaw. Oropharyngeal exudate and posterior oropharyngeal erythema present. No tonsillar abscesses.  Eyes: EOM and lids are normal. Pupils are equal, round, and reactive to light.  Neck: Normal range of motion. Neck supple. Carotid bruit is not present.  Cardiovascular: Normal rate, regular rhythm, normal heart sounds, intact distal pulses and normal pulses.   Pulmonary/Chest: Breath sounds normal. No respiratory distress.  Abdominal: Soft. Bowel sounds are normal. There is no tenderness. There is no guarding.  Musculoskeletal: Normal range of motion.  Lymphadenopathy:       Head (right side): No submandibular adenopathy present.       Head (left side): No submandibular adenopathy present.    She has no cervical adenopathy.  Neurological: She is alert and oriented to person, place, and time. She has normal strength. No cranial nerve deficit or sensory deficit. Coordination and gait normal.  Skin: Skin is warm  and dry.  Psychiatric: She has a normal mood and affect. Her speech is normal.  Nursing note and vitals reviewed.   ED Course  Procedures (including critical care time) Labs Review Labs Reviewed - No data to display  Imaging Review No results found. I have personally reviewed and evaluated these images and lab results as part of my medical decision-making.   EKG Interpretation None      MDM  Vital signs reviewed. Pulse oximetry is 100% on room air.  Patient treated in the emergency department with intramuscular Solu-Medrol and Norco. Patient advised to use salt water gargle. Prescription for Amoxil and Norco given to the patient. Patient also provided a mass and given instructions on hand washing.    Final diagnoses:  None    *I have reviewed nursing notes, vital signs, and all appropriate lab and imaging results for this patient.Ivery Quale, PA-C 07/31/15 [phone]  Glynn Octave, MD 08/01/15 [phone]

## 2015-09-25 ENCOUNTER — Ambulatory Visit (INDEPENDENT_AMBULATORY_CARE_PROVIDER_SITE_OTHER): Payer: Medicaid Other | Admitting: Obstetrics & Gynecology

## 2015-09-25 ENCOUNTER — Other Ambulatory Visit (HOSPITAL_COMMUNITY)
Admission: RE | Admit: 2015-09-25 | Discharge: 2015-09-25 | Disposition: A | Discharge status: Home / Self Care | Payer: No Typology Code available for payment source | Source: Ambulatory Visit | Attending: Obstetrics & Gynecology | Admitting: Obstetrics & Gynecology

## 2015-09-25 ENCOUNTER — Encounter: Payer: Self-pay | Admitting: Obstetrics & Gynecology

## 2015-09-25 VITALS — BP 120/80 | HR 76 | Ht 65.0 in | Wt 174.0 lb

## 2015-09-25 DIAGNOSIS — Z3009 Encounter for other general counseling and advice on contraception: Secondary | ICD-10-CM

## 2015-09-25 DIAGNOSIS — Z113 Encounter for screening for infections with a predominantly sexual mode of transmission: Secondary | ICD-10-CM | POA: Insufficient documentation

## 2015-09-25 DIAGNOSIS — Z1151 Encounter for screening for human papillomavirus (HPV): Secondary | ICD-10-CM | POA: Insufficient documentation

## 2015-09-25 DIAGNOSIS — Z01419 Encounter for gynecological examination (general) (routine) without abnormal findings: Secondary | ICD-10-CM

## 2015-09-25 DIAGNOSIS — Z01411 Encounter for gynecological examination (general) (routine) with abnormal findings: Secondary | ICD-10-CM | POA: Insufficient documentation

## 2015-09-25 MED ORDER — LEVONORGEST-ETH ESTRAD 91-DAY 0.15-0.03 &0.01 MG PO TABS: 1.0000 | ORAL_TABLET | Freq: Every day | ORAL | Status: DC

## 2015-09-25 NOTE — Progress Notes (Signed)
Patient ID: Carrie Mckenzie, female   DOB: [birth year], 25 y.o.   MRN: [id number] Subjective:     Carrie Mckenzie is a 25 y.o. female here for a routine exam.  Patient's last menstrual period was 08/26/2015. [id number] Birth Control Method:  none Menstrual Calendar(currently): regular wants to start seasonique  Current complaints: none.   Current acute medical issues:  none   Recent Gynecologic History Patient's last menstrual period was 08/26/2015. Last Pap: 2015,  normal Last mammogram: ,    Past Medical History  Diagnosis Date  . Anxiety   . Vaginal discharge 12/17/2013  . BV (bacterial vaginosis) 12/17/2013  . Ovarian cyst     Past Surgical History  Procedure Laterality Date  . Cholecystectomy    . Wisdom tooth extraction      OB History    Gravida Para Term Preterm AB TAB SAB Ectopic Multiple Living   0 1 0 1 0 0 2      Social History   Social History  . Marital Status: Single    Spouse Name: N/A  . Number of Children: N/A  . Years of Education: N/A   Social History Main Topics  . Smoking status: Current Every Day Smoker -- 0.50 packs/day for 10 years    Types: Cigarettes  . Smokeless tobacco: Never Used  . Alcohol Use: No  . Drug Use: No  . Sexual Activity: Yes    Birth Control/ Protection: None, Condom   Other Topics Concern  . None   Social History Narrative    Family History  Problem Relation Age of Onset  . Diabetes Mother   . Thyroid disease Mother     had thyroid removed  . Hypertension Father   . Cancer Father     prostate  . Diabetes Maternal Aunt   . Diabetes Maternal Uncle   . Cancer Maternal Grandmother     ovarian, uterine  . Diabetes Maternal Aunt   . Diabetes Maternal Aunt   . Diabetes Maternal Uncle   . Diabetes Maternal Uncle   . Diabetes Maternal Uncle   . Diabetes Maternal Uncle   . Diabetes Maternal Uncle   . Diabetes Maternal Uncle     No current outpatient prescriptions on file.  Review of Systems  Review of  Systems  Constitutional: Negative for fever, chills, weight loss, malaise/fatigue and diaphoresis.  HENT: Negative for hearing loss, ear pain, nosebleeds, congestion, sore throat, neck pain, tinnitus and ear discharge.   Eyes: Negative for blurred vision, double vision, photophobia, pain, discharge and redness.  Respiratory: Negative for cough, hemoptysis, sputum production, shortness of breath, wheezing and stridor.   Cardiovascular: Negative for chest pain, palpitations, orthopnea, claudication, leg swelling and PND.  Gastrointestinal: negative for abdominal pain. Negative for heartburn, nausea, vomiting, diarrhea, constipation, blood in stool and melena.  Genitourinary: Negative for dysuria, urgency, frequency, hematuria and flank pain.  Musculoskeletal: Negative for myalgias, back pain, joint pain and falls.  Skin: Negative for itching and rash.  Neurological: Negative for dizziness, tingling, tremors, sensory change, speech change, focal weakness, seizures, loss of consciousness, weakness and headaches.  Endo/Heme/Allergies: Negative for environmental allergies and polydipsia. Does not bruise/bleed easily.  Psychiatric/Behavioral: Negative for depression, suicidal ideas, hallucinations, memory loss and substance abuse. The patient is not nervous/anxious and does not have insomnia.        Objective:  Blood pressure 120/80, pulse 76, height  (1.651 m), weight 174 lb (78.926 kg), last menstrual period 08/26/2015, not currently breastfeeding.  Physical Exam  Vitals reviewed. Constitutional: She is oriented to person, place, and time. She appears well-developed and well-nourished.  HENT:  Head: Normocephalic and atraumatic.        Right Ear: External ear normal.  Left Ear: External ear normal.  Nose: Nose normal.  Mouth/Throat: Oropharynx is clear and moist.  Eyes: Conjunctivae and EOM are normal. Pupils are equal, round, and reactive to light. Right eye exhibits no discharge. Left  eye exhibits no discharge. No scleral icterus.  Neck: Normal range of motion. Neck supple. No tracheal deviation present. No thyromegaly present.  Cardiovascular: Normal rate, regular rhythm, normal heart sounds and intact distal pulses.  Exam reveals no gallop and no friction rub.   No murmur heard. Respiratory: Effort normal and breath sounds normal. No respiratory distress. She has no wheezes. She has no rales. She exhibits no tenderness.  GI: Soft. Bowel sounds are normal. She exhibits no distension and no mass. There is no tenderness. There is no rebound and no guarding.  Genitourinary:  Breasts no masses skin changes or nipple changes bilaterally      Vulva is normal without lesions Vagina is pink moist without discharge Cervix normal in appearance and pap is done Uterus is normal size shape and contour Adnexa is negative with normal sized ovaries   Musculoskeletal: Normal range of motion. She exhibits no edema and no tenderness.  Neurological: She is alert and oriented to person, place, and time. She has normal reflexes. She displays normal reflexes. No cranial nerve deficit. She exhibits normal muscle tone. Coordination normal.  Skin: Skin is warm and dry. No rash noted. No erythema. No pallor.  Psychiatric: She has a normal mood and affect. Her behavior is normal. Judgment and thought content normal.       Assessment:    Healthy female exam.    Plan:    noraml exam Begin OCP    Meds ordered this encounter  Medications  . Levonorgestrel-Ethinyl Estradiol (AMETHIA,CAMRESE) 0.15-0.03 &0.01 MG tablet    Sig: Take 1 tablet by mouth daily.    Dispense:  1 Package    Refill:  4

## 2015-09-26 LAB — HIV ANTIBODY (ROUTINE TESTING W REFLEX): HIV Screen 4th Generation wRfx: NONREACTIVE

## 2015-09-26 LAB — RPR: RPR: NONREACTIVE

## 2015-09-29 LAB — CYTOLOGY - PAP

## 2015-10-06 ENCOUNTER — Telehealth: Payer: Self-pay | Admitting: Obstetrics & Gynecology

## 2015-10-06 NOTE — Telephone Encounter (Signed)
Pt called requesting the results of her pap, please contact pt @ work [phone]. [phone]

## 2015-10-06 NOTE — Telephone Encounter (Signed)
Pt informed of pap results from 09/25/2015 ASCUS and +HPV, Colposcopy scheduled for October 30, 2015 per pt request. Procedure explained and all questions answered.  Pt states she has FP MCD, will this be covered by her insurance. Per Billing Dept., pt will either have to pay out of pocket or apply for financial aid. Call transferred to Billing Dept to discuss how to apply for financial aid.

## 2015-10-06 NOTE — Telephone Encounter (Signed)
Yes she needs it scheduled

## 2015-10-30 ENCOUNTER — Encounter: Payer: Self-pay | Admitting: Obstetrics & Gynecology

## 2015-10-30 ENCOUNTER — Other Ambulatory Visit: Payer: Self-pay | Admitting: Obstetrics & Gynecology

## 2015-10-30 ENCOUNTER — Ambulatory Visit (INDEPENDENT_AMBULATORY_CARE_PROVIDER_SITE_OTHER): Payer: Self-pay | Admitting: Obstetrics & Gynecology

## 2015-10-30 VITALS — BP 142/82 | Ht 66.0 in | Wt 171.0 lb

## 2015-10-30 DIAGNOSIS — Z3202 Encounter for pregnancy test, result negative: Secondary | ICD-10-CM

## 2015-10-30 DIAGNOSIS — R87619 Unspecified abnormal cytological findings in specimens from cervix uteri: Secondary | ICD-10-CM

## 2015-10-30 DIAGNOSIS — IMO0002 Reserved for concepts with insufficient information to code with codable children: Secondary | ICD-10-CM

## 2015-10-30 DIAGNOSIS — R896 Abnormal cytological findings in specimens from other organs, systems and tissues: Secondary | ICD-10-CM

## 2015-10-30 LAB — POCT URINE PREGNANCY: Preg Test, Ur: NEGATIVE

## 2015-10-30 NOTE — Progress Notes (Signed)
Patient ID: Carrie Mckenzie, female   DOB: 02/25/1991, 25 y.o.   MRN: [id number] Colposcopy Procedure Note:  Colposcopy Procedure Note  Indications: Pap smear 1 months ago showed: ASCUS with POSITIVE high risk HPV. The prior pap showed no abnormalities.  Prior cervical/vaginal disease: . Prior cervical treatment: .  Smoker:  Yes.  0.5 PPD New sexual partner:  Yes.    : time frame:  2 months  History of abnormal Pap: yes: years ago 2008 or 2009 just repeated Pap  Procedure Details  The risks and benefits of the procedure and Written informed consent obtained.  Speculum placed in vagina and excellent visualization of cervix achieved, cervix swabbed x 3 with acetic acid solution.  Findings: Cervix: large extension of AWE and +punctation and + mosaicism on the posterior lip of the cervix, very large extension of tissue pathology; cervical biopsies taken at 6 o'clock. Vaginal inspection: normal without visible lesions. Vulvar colposcopy: vulvar colposcopy not performed.  Specimens: x1: 6 o'clock low on the posterior cerix  Complications: none.  Impression: LSIL  Plan: Specimens labelled and sent to Pathology. Follow up 1 week to review result

## 2015-10-30 NOTE — Addendum Note (Signed)
Addended by: Federico FlakeNES, Amberia Bayless A on: 10/30/2015 11:42 AM   Modules accepted: Orders

## 2015-11-06 ENCOUNTER — Encounter: Payer: Self-pay | Admitting: Obstetrics & Gynecology

## 2015-11-06 ENCOUNTER — Ambulatory Visit (INDEPENDENT_AMBULATORY_CARE_PROVIDER_SITE_OTHER): Payer: Self-pay | Admitting: Obstetrics & Gynecology

## 2015-11-06 VITALS — BP 130/80 | HR 88 | Wt 169.0 lb

## 2015-11-06 DIAGNOSIS — IMO0002 Reserved for concepts with insufficient information to code with codable children: Secondary | ICD-10-CM

## 2015-11-06 DIAGNOSIS — N870 Mild cervical dysplasia: Secondary | ICD-10-CM

## 2015-11-06 DIAGNOSIS — N72 Inflammatory disease of cervix uteri: Secondary | ICD-10-CM

## 2015-11-06 DIAGNOSIS — R87612 Low grade squamous intraepithelial lesion on cytologic smear of cervix (LGSIL): Secondary | ICD-10-CM

## 2015-11-06 NOTE — Progress Notes (Signed)
Patient ID: Carrie Mckenzie, female   DOB: 07/13/1991, 25 y.o.   MRN: [id number] Patient ID: Carrie Mckenzie, female   DOB: 02/16/1991, 25 y.o.   MRN: [id number]   Biopsy report: Low Grade dysplasia, LSIL  Will need close follow up Colpsocpy in 6 months for re evaluation,   Pt aware of diagnosis and understands necessity of close follow up     Face to face time:  10 minutes  Greater than 50% of the visit time was spent in counseling and coordination of care with the patient.  The summary and outline of the counseling and care coordination is summarized in the note above.   All questions were answered.    Colposcopy Procedure Note:  Colposcopy Procedure Note  Indications: Pap smear 1 months ago showed: ASCUS with POSITIVE high risk HPV. The prior pap showed no abnormalities.  Prior cervical/vaginal disease: . Prior cervical treatment: .  Smoker:  Yes.  0.5 PPD New sexual partner:  Yes.    : time frame:  2 months  History of abnormal Pap: yes: years ago 2008 or 2009 just repeated Pap  Procedure Details  The risks and benefits of the procedure and Written informed consent obtained.  Speculum placed in vagina and excellent visualization of cervix achieved, cervix swabbed x 3 with acetic acid solution.  Findings: Cervix: large extension of AWE and +punctation and + mosaicism on the posterior lip of the cervix, very large extension of tissue pathology; cervical biopsies taken at 6 o'clock. Vaginal inspection: normal without visible lesions. Vulvar colposcopy: vulvar colposcopy not performed.  Specimens: x1: 6 o'clock low on the posterior cerix  Complications: none.  Impression: LSIL  Plan: Specimens labelled and sent to Pathology. Follow up 1 week to review result

## 2015-12-10 ENCOUNTER — Telehealth: Payer: Self-pay | Admitting: Obstetrics & Gynecology

## 2015-12-10 NOTE — Telephone Encounter (Signed)
Pt states she [facility], only suppose to have a period q 3 months but started her period 3 weeks early. Should she start the "sugar pills" or continue the active pills. Per [name], CNM, pt needs to continue the active pills. Pt verbalized understanding.

## 2015-12-10 NOTE — Telephone Encounter (Signed)
Pt called stating that she has a question regarding her birth control pill. Please contact pt

## 2016-02-19 ENCOUNTER — Ambulatory Visit (INDEPENDENT_AMBULATORY_CARE_PROVIDER_SITE_OTHER): Payer: Medicaid Other | Admitting: Obstetrics & Gynecology

## 2016-02-19 ENCOUNTER — Encounter: Payer: Self-pay | Admitting: Obstetrics & Gynecology

## 2016-02-19 VITALS — BP 120/80 | HR 80 | Ht 66.0 in | Wt 144.0 lb

## 2016-02-19 DIAGNOSIS — Z308 Encounter for other contraceptive management: Secondary | ICD-10-CM

## 2016-02-19 DIAGNOSIS — Z7251 High risk heterosexual behavior: Secondary | ICD-10-CM

## 2016-02-19 DIAGNOSIS — Z113 Encounter for screening for infections with a predominantly sexual mode of transmission: Secondary | ICD-10-CM

## 2016-02-19 MED ORDER — TERCONAZOLE 0.4 % VA CREA: 1.0000 | TOPICAL_CREAM | Freq: Every day | VAGINAL | Status: DC

## 2016-02-19 NOTE — Progress Notes (Signed)
Patient ID: Carrie Mckenzie, female   DOB: 01/14/1991, 25 y.o.   MRN: [id number]      Chief Complaint  Patient presents with  . Vaginal Discharge    STD Testing    Blood pressure 120/80, pulse 80, height 5\' 6"  (1.676 m), weight 144 lb (65.3 kg), last menstrual period 02/03/2016, not currently breastfeeding. Past Surgical History:  Procedure Laterality Date  . CHOLECYSTECTOMY    . WISDOM TOOTH EXTRACTION      25 y.o. [id number] Patient's last menstrual period was 02/03/2016. The current method of family planning is none.  Subjective Pt with itchy vaginal discharge for some time, no odor, no pain Also concerned about a sexual exposure risk and wants STD check, nothing specific  Objective Vulva:  normal appearing vulva with no masses, tenderness or lesions Vagina:  normal mucosa, curd-like discharge Cervix:  no cervical motion tenderness and no lesions Uterus:  normal size, contour, position, consistency, mobility, non-tender Adnexa: ovaries:present,  normal adnexa in size, nontender and no masses   Pertinent ROS No burning with urination, frequency or urgency No nausea, vomiting or diarrhea Nor fever chills or other constitutional symptoms   Labs or studies Wet Prep:   A sample of vaginal discharge was obtained from the posterior fornix using a cotton swab. 2 drops of saline were placed on a slide and the cotton swab was immersed in the saline. Microscopic evaluation was performed and results were as follows:  Positive  for yeast  Negative for clue cells , consistent with Bacterial vaginosis Negative for trichomonas  Normal WBC population   Whiff test: Negative     Impression Diagnoses this Encounter::   ICD-9-CM ICD-10-CM   1. High risk sexual behavior V69.2 Z72.51 HIV antibody     RPR  2. Screening for STD (sexually transmitted disease) V74.5 Z11.3 GC/Chlamydia Probe Amp    Established relevant diagnosis(es):   Plan/Recommendations: Meds ordered this  encounter  Medications  . terconazole (TERAZOL 7) 0.4 % vaginal cream    Sig: Place 1 applicator vaginally at bedtime.    Dispense:  45 g    Refill:  0    Labs or Scans Ordered: Orders Placed This Encounter  Procedures  . GC/Chlamydia Probe Amp  . HIV antibody  . RPR    Management:: Call with results  Follow up Return if symptoms worsen or fail to improve.         All questions were answered.

## 2016-02-20 LAB — RPR: RPR Ser Ql: NONREACTIVE

## 2016-02-20 LAB — HIV ANTIBODY (ROUTINE TESTING W REFLEX): HIV Screen 4th Generation wRfx: NONREACTIVE

## 2016-02-21 LAB — GC/CHLAMYDIA PROBE AMP
Chlamydia trachomatis, NAA: NEGATIVE
Neisseria gonorrhoeae by PCR: NEGATIVE

## 2016-02-25 ENCOUNTER — Telehealth: Payer: Self-pay | Admitting: Obstetrics & Gynecology

## 2016-02-26 ENCOUNTER — Telehealth: Payer: Self-pay | Admitting: Obstetrics & Gynecology

## 2016-02-26 MED ORDER — FLUCONAZOLE 150 MG PO TABS: 150.0000 mg | ORAL_TABLET | Freq: Once | ORAL | Status: DC

## 2016-05-10 ENCOUNTER — Ambulatory Visit: Payer: Medicaid Other | Admitting: Obstetrics & Gynecology

## 2016-05-10 ENCOUNTER — Encounter: Payer: Self-pay | Admitting: *Deleted

## 2016-05-12 ENCOUNTER — Telehealth: Payer: Self-pay | Admitting: Obstetrics & Gynecology

## 2016-05-14 NOTE — Telephone Encounter (Signed)
Pt states missed her appt with Dr.Eure on 05/10/2016, having trouble with her mcd and wants to know does she need to r/s this appt or not?

## 2016-05-14 NOTE — Telephone Encounter (Signed)
Pt had a colposcopy in March with biopsy revealing LSIL, my plan was to repeat the colposcopic evaluation in 6 months which I guess was the 10/2 appt  The patient came in on her own for an unrelated issue in July to evaluate for STDs, that was not related to her Pap and colpo issues  So yes she does need to have a close follow up re evaluation of her cervix, certainly no later than her yearly time of February

## 2016-05-19 NOTE — Telephone Encounter (Signed)
Pt informed per Dr. Despina HiddenEure needs to have an Colposcopy for abnormal pap by February. Pt scheduled an appt but concerned her Family Planning MCD will not cover it. [facility].

## 2016-08-09 NOTE — L&D Delivery Note (Signed)
Delivery Note At 1:26 AM a viable female was delivered via Vaginal, Spontaneous (Presentation: LOA).  APGAR: 9,9 ; weight pending.   Placenta status: Delivered intact with gentle traction.  Cord: 3 vessels  with the following complications: None .  Cord pH: not collected  Anesthesia:  Epidural Episiotomy: None Lacerations: None Suture Repair: N/A Est. Blood Loss (mL): 50 ml  Mom to postpartum.  Baby to Couplet care / Skin to Skin.  Carrie NeighboursAbdoulaye Diallo, MD 07/06/2017, 1:42 AM  OB FELLOW DELIVERY ATTESTATION I was gloved and present for the delivery in its entirety, and I agree with the above resident's note.    GBS positive. First dose of Ampicillin at 12:59 am, inadequate tx Delivery vertex, LOA. No complications. No lacerations.  Carrie PearJulie P Branton Einstein, MD OB Fellow 2:14 AM

## 2016-09-28 ENCOUNTER — Other Ambulatory Visit (HOSPITAL_COMMUNITY)
Admission: RE | Admit: 2016-09-28 | Discharge: 2016-09-28 | Disposition: A | Discharge status: Home / Self Care | Payer: Medicaid Other | Source: Ambulatory Visit | Attending: Obstetrics & Gynecology | Admitting: Obstetrics & Gynecology

## 2016-09-28 ENCOUNTER — Ambulatory Visit (INDEPENDENT_AMBULATORY_CARE_PROVIDER_SITE_OTHER): Payer: Medicaid Other | Admitting: Obstetrics & Gynecology

## 2016-09-28 ENCOUNTER — Encounter: Payer: Self-pay | Admitting: Obstetrics & Gynecology

## 2016-09-28 VITALS — BP 110/60 | HR 80 | Ht 65.0 in | Wt 144.0 lb

## 2016-09-28 DIAGNOSIS — Z3009 Encounter for other general counseling and advice on contraception: Secondary | ICD-10-CM

## 2016-09-28 DIAGNOSIS — Z113 Encounter for screening for infections with a predominantly sexual mode of transmission: Secondary | ICD-10-CM | POA: Insufficient documentation

## 2016-09-28 DIAGNOSIS — Z01411 Encounter for gynecological examination (general) (routine) with abnormal findings: Secondary | ICD-10-CM | POA: Insufficient documentation

## 2016-09-28 DIAGNOSIS — Z01419 Encounter for gynecological examination (general) (routine) without abnormal findings: Secondary | ICD-10-CM

## 2016-09-28 DIAGNOSIS — Z308 Encounter for other contraceptive management: Secondary | ICD-10-CM | POA: Diagnosis not present

## 2016-09-28 NOTE — Progress Notes (Signed)
Subjective:     Carrie Mckenzie is a 26 y.o. female here for a routine exam.  Patient's last menstrual period was 09/06/2016. [id number] Birth Control Method:  None, she is trying Menstrual Calendar(currently): regular  Current complaints: none.   Current acute medical issues:  none   Recent Gynecologic History Patient's last menstrual period was 09/06/2016. Last Pap: 09/25/2015,  ASCUS +HPV Last mammogram: ,    Past Medical History:  Diagnosis Date  . Anxiety   . BV (bacterial vaginosis) 12/17/2013  . Ovarian cyst   . Vaginal discharge 12/17/2013    Past Surgical History:  Procedure Laterality Date  . CHOLECYSTECTOMY    . WISDOM TOOTH EXTRACTION      OB History    Gravida Para Term Preterm AB Living   3 2 2  0 1 2   SAB TAB Ectopic Multiple Live Births   1 0 0 0 2      Social History   Social History  . Marital status: Single    Spouse name: N/A  . Number of children: N/A  . Years of education: N/A   Social History Main Topics  . Smoking status: Current Every Day Smoker    Packs/day: 0.50    Years: 10.00    Types: Cigarettes  . Smokeless tobacco: Never Used  . Alcohol use 0.0 oz/week     Comment: occassionally  . Drug use: No  . Sexual activity: Yes    Birth control/ protection: None, Condom   Other Topics Concern  . None   Social History Narrative  . None    Family History  Problem Relation Age of Onset  . Diabetes Mother   . Thyroid disease Mother     had thyroid removed  . Hypertension Father   . Cancer Father     prostate  . Diabetes Maternal Aunt   . Diabetes Maternal Uncle   . Cancer Maternal Grandmother     ovarian, uterine  . Diabetes Maternal Aunt   . Diabetes Maternal Aunt   . Diabetes Maternal Uncle   . Diabetes Maternal Uncle   . Diabetes Maternal Uncle   . Diabetes Maternal Uncle   . Diabetes Maternal Uncle   . Diabetes Maternal Uncle      Current Outpatient Prescriptions:  .  ibuprofen (ADVIL,MOTRIN) 200 MG tablet, Take  200 mg by mouth every 6 (six) hours as needed., Disp: , Rfl:   Review of Systems  Review of Systems  Constitutional: Negative for fever, chills, weight loss, malaise/fatigue and diaphoresis.  HENT: Negative for hearing loss, ear pain, nosebleeds, congestion, sore throat, neck pain, tinnitus and ear discharge.   Eyes: Negative for blurred vision, double vision, photophobia, pain, discharge and redness.  Respiratory: Negative for cough, hemoptysis, sputum production, shortness of breath, wheezing and stridor.   Cardiovascular: Negative for chest pain, palpitations, orthopnea, claudication, leg swelling and PND.  Gastrointestinal: negative for abdominal pain. Negative for heartburn, nausea, vomiting, diarrhea, constipation, blood in stool and melena.  Genitourinary: Negative for dysuria, urgency, frequency, hematuria and flank pain.  Musculoskeletal: Negative for myalgias, back pain, joint pain and falls.  Skin: Negative for itching and rash.  Neurological: Negative for dizziness, tingling, tremors, sensory change, speech change, focal weakness, seizures, loss of consciousness, weakness and headaches.  Endo/Heme/Allergies: Negative for environmental allergies and polydipsia. Does not bruise/bleed easily.  Psychiatric/Behavioral: Negative for depression, suicidal ideas, hallucinations, memory loss and substance abuse. The patient is not nervous/anxious and does not have insomnia.  Objective:  Blood pressure 110/60, pulse 80, height 5\' 5"  (1.651 m), weight 144 lb (65.3 kg), last menstrual period 09/06/2016.   Physical Exam  Vitals reviewed. Constitutional: She is oriented to person, place, and time. She appears well-developed and well-nourished.  HENT:  Head: Normocephalic and atraumatic.        Right Ear: External ear normal.  Left Ear: External ear normal.  Nose: Nose normal.  Mouth/Throat: Oropharynx is clear and moist.  Eyes: Conjunctivae and EOM are normal. Pupils are equal,  round, and reactive to light. Right eye exhibits no discharge. Left eye exhibits no discharge. No scleral icterus.  Neck: Normal range of motion. Neck supple. No tracheal deviation present. No thyromegaly present.  Cardiovascular: Normal rate, regular rhythm, normal heart sounds and intact distal pulses.  Exam reveals no gallop and no friction rub.   No murmur heard. Respiratory: Effort normal and breath sounds normal. No respiratory distress. She has no wheezes. She has no rales. She exhibits no tenderness.  GI: Soft. Bowel sounds are normal. She exhibits no distension and no mass. There is no tenderness. There is no rebound and no guarding.  Genitourinary:  Breasts no masses skin changes or nipple changes bilaterally      Vulva is normal without lesions Vagina is pink moist without discharge Cervix normal in appearance and pap is done Uterus is normal size shape and contour Adnexa is negative with normal sized ovaries   Musculoskeletal: Normal range of motion. She exhibits no edema and no tenderness.  Neurological: She is alert and oriented to person, place, and time. She has normal reflexes. She displays normal reflexes. No cranial nerve deficit. She exhibits normal muscle tone. Coordination normal.  Skin: Skin is warm and dry. No rash noted. No erythema. No pallor.  Psychiatric: She has a normal mood and affect. Her behavior is normal. Judgment and thought content normal.       Medications Ordered at today's visit: Meds ordered this encounter  Medications  . ibuprofen (ADVIL,MOTRIN) 200 MG tablet    Sig: Take 200 mg by mouth every 6 (six) hours as needed.    Other orders placed at today's visit: No orders of the defined types were placed in this encounter.     Assessment:    Healthy female exam.    Plan:    Contraception: none. Follow up in: 1 year.   or when pregnant   Return in about 1 year (around 09/28/2017) for yearly, Follow up.

## 2016-09-28 NOTE — Addendum Note (Signed)
Addended by: Federico FlakeNES, PEGGY A on: 09/28/2016 04:46 PM   Modules accepted: Orders

## 2016-09-29 LAB — RPR: RPR: NONREACTIVE

## 2016-09-29 LAB — HIV ANTIBODY (ROUTINE TESTING W REFLEX): HIV Screen 4th Generation wRfx: NONREACTIVE

## 2016-10-01 LAB — CYTOLOGY - PAP
CHLAMYDIA, DNA PROBE: NEGATIVE
Neisseria Gonorrhea: NEGATIVE

## 2016-11-18 ENCOUNTER — Encounter: Payer: Self-pay | Admitting: Women's Health

## 2016-11-18 ENCOUNTER — Ambulatory Visit (INDEPENDENT_AMBULATORY_CARE_PROVIDER_SITE_OTHER): Payer: Medicaid Other | Admitting: Women's Health

## 2016-11-18 VITALS — BP 100/60 | HR 76 | Wt 139.0 lb

## 2016-11-18 DIAGNOSIS — R112 Nausea with vomiting, unspecified: Secondary | ICD-10-CM | POA: Diagnosis not present

## 2016-11-18 DIAGNOSIS — N912 Amenorrhea, unspecified: Secondary | ICD-10-CM

## 2016-11-18 DIAGNOSIS — Z3201 Encounter for pregnancy test, result positive: Secondary | ICD-10-CM

## 2016-11-18 DIAGNOSIS — Z113 Encounter for screening for infections with a predominantly sexual mode of transmission: Secondary | ICD-10-CM

## 2016-11-18 DIAGNOSIS — Z3491 Encounter for supervision of normal pregnancy, unspecified, first trimester: Secondary | ICD-10-CM

## 2016-11-18 DIAGNOSIS — Z3490 Encounter for supervision of normal pregnancy, unspecified, unspecified trimester: Secondary | ICD-10-CM

## 2016-11-18 LAB — POCT URINE PREGNANCY: Preg Test, Ur: POSITIVE — AB

## 2016-11-18 MED ORDER — PRENATAL PLUS 27-1 MG PO TABS: 1.0000 | ORAL_TABLET | Freq: Every day | ORAL | 12 refills | Status: DC

## 2016-11-18 NOTE — Patient Instructions (Signed)

## 2016-11-18 NOTE — Progress Notes (Signed)
   [facility] Visit  Patient name: Carrie Mckenzie MRN [id number]  Date of birth: 1991/07/01  CC & HPI:  Carrie Mckenzie is a 26 y.o. [phone] African American female presenting today for pregnancy test and STD screen. Has had +UPT at home. Some nausea, vomiting the other day, none since. Denies need for nausea meds right now. Not taking pnv yet. Unsure of LMP- thinks it was around 10/09/16, have been regular. 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 No current sx. Discussed we will be getting full std screen w/ new ob labs- pt wants to just wait and do std screen then. Does want gc/ct testing today.  Patient's last menstrual period was 10/09/2016.  Pertinent History Reviewed:  Medical & Surgical Hx:   Past medical, surgical, family, and social history reviewed in electronic medical record Medications: Reviewed & Updated - see associated section Allergies: Reviewed in electronic medical record  Objective Findings:  Vitals: BP 100/60   Pulse 76   Wt 139 lb (63 kg)   LMP 10/09/2016   BMI 23.13 kg/m  Body mass index is 23.13 kg/m.  Physical Examination: General appearance - alert, well appearing, and in no distress Pelvic - cx clear, scant white nonodorous d/c, gc/ct swab obtained  Results for orders placed or performed in visit on 11/18/16 (from the past 24 hour(s))  POCT urine pregnancy   Collection Time: 11/18/16 12:14 PM  Result Value Ref Range   Preg Test, Ur Positive (A) Negative     Assessment & Plan:  A:   [redacted]w[redacted]d pregnant by uncertain LMP  GC/CT testing P:  GC/CT sent today  Rx prenatal plus x 27yr per pt request  Printed info given for n/v, first trimester. Call if needs nausea meds  Return in about 2 weeks (around 12/02/2016) for dating u/s, then 4wks from now for , intake w/ tish & new ob.  Marge Duncans CNM, [facility] 11/18/2016 12:35  PM

## 2016-11-21 LAB — GC/CHLAMYDIA PROBE AMP
CHLAMYDIA, DNA PROBE: NEGATIVE
Neisseria gonorrhoeae by PCR: NEGATIVE

## 2016-11-22 ENCOUNTER — Telehealth: Payer: Self-pay | Admitting: Women's Health

## 2016-11-22 MED ORDER — DOXYLAMINE-PYRIDOXINE 10-10 MG PO TBEC: DELAYED_RELEASE_TABLET | ORAL | 6 refills | Status: DC

## 2016-11-22 MED ORDER — PROMETHAZINE HCL 25 MG PO TABS
12.5000 mg | ORAL_TABLET | Freq: Four times a day (QID) | ORAL | 0 refills | Status: DC | PRN
Start: 2016-11-22 — End: 2017-04-30

## 2016-11-22 NOTE — Telephone Encounter (Signed)
Called pt to inform her Diclegis was sent to pharmacy and pt states she told provider at last visit that she could not take Diclegis because it causes headaches, wants to know if alternative can be sent to pharmacy.

## 2016-11-22 NOTE — Telephone Encounter (Signed)
Pt informed Phenergan sent to pharmacy and I will call pharmacy to cancel RX for Diclegis.  Pt voiced understanding.

## 2016-12-02 ENCOUNTER — Ambulatory Visit (INDEPENDENT_AMBULATORY_CARE_PROVIDER_SITE_OTHER): Payer: Medicaid Other

## 2016-12-02 ENCOUNTER — Other Ambulatory Visit: Payer: Self-pay | Admitting: Women's Health

## 2016-12-02 DIAGNOSIS — Z3491 Encounter for supervision of normal pregnancy, unspecified, first trimester: Secondary | ICD-10-CM

## 2016-12-02 DIAGNOSIS — N83202 Unspecified ovarian cyst, left side: Secondary | ICD-10-CM

## 2016-12-02 NOTE — Progress Notes (Signed)
[place] 7+5 wks,single IUP w/ys,pos fht 166 bpm,normal right ovary,simple left ovarian cyst w/arterial and venous flow 8.2 x 7.6 x 6.5 cm,crl 16.2 mm,EDD 07/16/2017

## 2016-12-16 ENCOUNTER — Ambulatory Visit (INDEPENDENT_AMBULATORY_CARE_PROVIDER_SITE_OTHER): Payer: Medicaid Other | Admitting: Women's Health

## 2016-12-16 ENCOUNTER — Encounter: Payer: Self-pay | Admitting: Women's Health

## 2016-12-16 ENCOUNTER — Ambulatory Visit: Payer: Medicaid Other | Admitting: *Deleted

## 2016-12-16 VITALS — BP 90/70 | HR 71 | Wt 142.5 lb

## 2016-12-16 DIAGNOSIS — F17200 Nicotine dependence, unspecified, uncomplicated: Secondary | ICD-10-CM | POA: Diagnosis not present

## 2016-12-16 DIAGNOSIS — Z331 Pregnant state, incidental: Secondary | ICD-10-CM

## 2016-12-16 DIAGNOSIS — Z3481 Encounter for supervision of other normal pregnancy, first trimester: Secondary | ICD-10-CM | POA: Diagnosis not present

## 2016-12-16 DIAGNOSIS — Z8759 Personal history of other complications of pregnancy, childbirth and the puerperium: Secondary | ICD-10-CM

## 2016-12-16 DIAGNOSIS — Z8679 Personal history of other diseases of the circulatory system: Secondary | ICD-10-CM

## 2016-12-16 DIAGNOSIS — N83202 Unspecified ovarian cyst, left side: Secondary | ICD-10-CM | POA: Insufficient documentation

## 2016-12-16 DIAGNOSIS — Z3A09 9 weeks gestation of pregnancy: Secondary | ICD-10-CM

## 2016-12-16 DIAGNOSIS — Z3490 Encounter for supervision of normal pregnancy, unspecified, unspecified trimester: Secondary | ICD-10-CM | POA: Insufficient documentation

## 2016-12-16 DIAGNOSIS — Z1389 Encounter for screening for other disorder: Secondary | ICD-10-CM

## 2016-12-16 DIAGNOSIS — Z3682 Encounter for antenatal screening for nuchal translucency: Secondary | ICD-10-CM

## 2016-12-16 LAB — POCT URINALYSIS DIPSTICK
Glucose, UA: NEGATIVE
Ketones, UA: NEGATIVE
Leukocytes, UA: NEGATIVE
Nitrite, UA: NEGATIVE
PROTEIN UA: NEGATIVE
RBC UA: NEGATIVE

## 2016-12-16 NOTE — Patient Instructions (Signed)

## 2016-12-16 NOTE — Progress Notes (Signed)
Subjective:  Carrie Mckenzie is a 26 y.o. [phone] African American female at [redacted]w[redacted]d by LMP c/w 8wk u/s, being seen today for her first obstetrical visit.  Her obstetrical history is significant for smoker: 1/2ppd- wants to try quitting on her own, term SVB x 2, sab x 1.  H/O postpartum HTN requiring norvasc. Lt simple ovarian cyst noted on dating u/s: 8.2x7.6x,6.5cm. Pt denies pain. Pregnancy history fully reviewed.  Patient reports no complaints. Denies vb, cramping, uti s/s, abnormal/malodorous vag d/c, or vulvovaginal itching/irritation.  BP 90/70   Pulse 71   Wt 142 lb 8 oz (64.6 kg)   LMP 10/09/2016 (Approximate)   BMI 23.71 kg/m   HISTORY: OB History  Gravida Para Term Preterm AB Living  4 2 2  0 1 2  SAB TAB Ectopic Multiple Live Births  1 0 0 0 2    # Outcome Date GA Lbr Len/2nd Weight Sex Delivery Anes PTL Lv  4 Current           3 Term 04/02/14 [redacted]w[redacted]d 04:54 / 00:27 7 lb 5.1 oz (3.32 kg) M Vag-Spont EPI N LIV  2 SAB 2010          1 Term 01/09/08 [redacted]w[redacted]d  7 lb 6 oz (3.345 kg) F Vag-Spont EPI N LIV     Complications: Chorioamnionitis     Birth Comments: chorioamnionitis, baby w/ pneumonia in NICU x 1wk     Past Medical History:  Diagnosis Date  . Anxiety   . BV (bacterial vaginosis) 12/17/2013  . Ovarian cyst   . Vaginal discharge 12/17/2013   Past Surgical History:  Procedure Laterality Date  . CHOLECYSTECTOMY    . WISDOM TOOTH EXTRACTION     Family History  Problem Relation Age of Onset  . Diabetes Mother   . Thyroid disease Mother        had thyroid removed  . Hypertension Mother   . Hypertension Father   . Cancer Father        prostate  . Depression Father   . Anxiety disorder Father   . Cirrhosis Father   . COPD Father   . Diabetes Maternal Aunt   . Diabetes Maternal Uncle   . Cancer Maternal Grandmother        ovarian, uterine  . Diabetes Maternal Aunt   . Diabetes Maternal Aunt   . Diabetes Maternal Uncle   . Diabetes Maternal Uncle   . Diabetes  Maternal Uncle   . Diabetes Maternal Uncle   . Diabetes Maternal Uncle   . Diabetes Maternal Uncle   . ADD / ADHD Daughter   . COPD Maternal Grandfather   . Emphysema Maternal Grandfather   . Asthma Paternal Aunt     Exam   System:     General: Well developed & nourished, no acute distress   Skin: Warm & dry, normal coloration and turgor, no rashes   Neurologic: Alert & oriented, normal mood   Cardiovascular: Regular rate & rhythm   Respiratory: Effort & rate normal, LCTAB, acyanotic   Abdomen: Soft, non tender   Extremities: normal strength, tone  Thin prep pap smear 09/28/16: LSIL/CIN-1/HPV, hasn't had colpo  FHR: + via informal u/s, fetus active   Assessment:   Pregnancy: [id number] Patient Active Problem List   Diagnosis Date Noted  . Postpartum hypertension 05/13/2014    Priority: High  . Rubella non-immune status, antepartum 08/28/2013    Priority: High  . Anxiety 08/27/2013    Priority: High  . Nicotine  addiction 08/27/2013    Priority: High  . Supervision of normal pregnancy 12/16/2016  . BV (bacterial vaginosis) 12/17/2013    2461w5d [id number] New OB visit Large Lt simple ovarian cyst Smoker H/O PP HTN  Plan:  Initial labs obtained Continue prenatal vitamins Problem list reviewed and updated Reviewed n/v relief measures and warning s/s to report Reviewed recommended weight gain based on pre-gravid BMI Encouraged well-balanced diet Genetic Screening discussed Integrated Screen: requested Cystic fibrosis screening discussed results reviewed, neg prev preg Ultrasound discussed; fetal survey: requested Follow up in 3 weeks for visit and 1st IT/NT CCNC completed Discussed s/s ovarian torsion, reasons to seek care Smokes 1/2pp/day, advised cessation, discussed risks to fetus while pregnant, to infant pp, and to herself. Offered QuitlineNC, declined   Marge DuncansBooker, Decarla Siemen Randall CNM, [facility] 12/16/2016 11:15 AM

## 2016-12-17 ENCOUNTER — Telehealth: Payer: Self-pay | Admitting: Women's Health

## 2016-12-17 LAB — URINALYSIS, ROUTINE W REFLEX MICROSCOPIC
Bilirubin, UA: NEGATIVE
Glucose, UA: NEGATIVE
KETONES UA: NEGATIVE
Leukocytes, UA: NEGATIVE
NITRITE UA: NEGATIVE
Protein, UA: NEGATIVE
RBC, UA: NEGATIVE
Specific Gravity, UA: 1.017 (ref 1.005–1.030)
Urobilinogen, Ur: 0.2 mg/dL (ref 0.2–1.0)
pH, UA: 7.5 (ref 5.0–7.5)

## 2016-12-17 LAB — HEPATITIS B SURFACE ANTIGEN: Hepatitis B Surface Ag: NEGATIVE

## 2016-12-17 LAB — PMP SCREEN PROFILE (10S), URINE
AMPHETAMINE SCREEN URINE: NEGATIVE ng/mL
BARBITURATE SCREEN URINE: NEGATIVE ng/mL
BENZODIAZEPINE SCREEN, URINE: NEGATIVE ng/mL
CANNABINOIDS UR QL SCN: NEGATIVE ng/mL
Cocaine (Metab) Scrn, Ur: NEGATIVE ng/mL
Creatinine(Crt), U: 108.4 mg/dL (ref 20.0–300.0)
Methadone Screen, Urine: NEGATIVE ng/mL
OXYCODONE+OXYMORPHONE UR QL SCN: NEGATIVE ng/mL
Opiate Scrn, Ur: NEGATIVE ng/mL
PH UR, DRUG SCRN: 7.3 (ref 4.5–8.9)
PHENCYCLIDINE QUANTITATIVE URINE: NEGATIVE ng/mL
Propoxyphene Scrn, Ur: NEGATIVE ng/mL

## 2016-12-17 LAB — CBC
Hematocrit: 36 % (ref 34.0–46.6)
Hemoglobin: 10.8 g/dL — ABNORMAL LOW (ref 11.1–15.9)
MCH: 23.8 pg — ABNORMAL LOW (ref 26.6–33.0)
MCHC: 30 g/dL — AB (ref 31.5–35.7)
MCV: 79 fL (ref 79–97)
PLATELETS: 292 10*3/uL (ref 150–379)
RBC: 4.54 x10E6/uL (ref 3.77–5.28)
RDW: 23 % — ABNORMAL HIGH (ref 12.3–15.4)
WBC: 5.6 10*3/uL (ref 3.4–10.8)

## 2016-12-17 LAB — RPR: RPR: NONREACTIVE

## 2016-12-17 LAB — ABO/RH: Rh Factor: POSITIVE

## 2016-12-17 LAB — HIV ANTIBODY (ROUTINE TESTING W REFLEX): HIV Screen 4th Generation wRfx: NONREACTIVE

## 2016-12-17 LAB — VARICELLA ZOSTER ANTIBODY, IGG: Varicella zoster IgG: 2665 index (ref >165)

## 2016-12-17 LAB — ANTIBODY SCREEN: Antibody Screen: NEGATIVE

## 2016-12-17 LAB — RUBELLA SCREEN: Rubella Antibodies, IGG: 1.33 index (ref >0.99)

## 2016-12-17 MED ORDER — FERROUS SULFATE 325 (65 FE) MG PO TABS: 325.0000 mg | ORAL_TABLET | Freq: Two times a day (BID) | ORAL | 3 refills | Status: DC

## 2016-12-17 NOTE — Telephone Encounter (Signed)
LMOVM that she is anemic per Selena BattenKim. Informed prescription for iron was sent to pharmacy. Encouraged to continue to take PNV and increase diet with green leafy vegetables, red meat, beans. Advised to call back if further questions.

## 2016-12-18 LAB — URINE CULTURE

## 2016-12-20 LAB — GC/CHLAMYDIA PROBE AMP
Chlamydia trachomatis, NAA: NEGATIVE
NEISSERIA GONORRHOEAE BY PCR: NEGATIVE

## 2017-01-06 ENCOUNTER — Ambulatory Visit (INDEPENDENT_AMBULATORY_CARE_PROVIDER_SITE_OTHER): Payer: Medicaid Other

## 2017-01-06 ENCOUNTER — Ambulatory Visit (INDEPENDENT_AMBULATORY_CARE_PROVIDER_SITE_OTHER): Payer: Medicaid Other | Admitting: Advanced Practice Midwife

## 2017-01-06 ENCOUNTER — Encounter: Payer: Self-pay | Admitting: Advanced Practice Midwife

## 2017-01-06 ENCOUNTER — Other Ambulatory Visit: Payer: Self-pay | Admitting: Women's Health

## 2017-01-06 VITALS — BP 90/54 | HR 77 | Wt 141.0 lb

## 2017-01-06 DIAGNOSIS — Z1389 Encounter for screening for other disorder: Secondary | ICD-10-CM

## 2017-01-06 DIAGNOSIS — N83202 Unspecified ovarian cyst, left side: Secondary | ICD-10-CM | POA: Diagnosis not present

## 2017-01-06 DIAGNOSIS — Z3401 Encounter for supervision of normal first pregnancy, first trimester: Secondary | ICD-10-CM

## 2017-01-06 DIAGNOSIS — Z3682 Encounter for antenatal screening for nuchal translucency: Secondary | ICD-10-CM | POA: Diagnosis not present

## 2017-01-06 DIAGNOSIS — Z3481 Encounter for supervision of other normal pregnancy, first trimester: Secondary | ICD-10-CM

## 2017-01-06 DIAGNOSIS — Z331 Pregnant state, incidental: Secondary | ICD-10-CM

## 2017-01-06 LAB — POCT URINALYSIS DIPSTICK
GLUCOSE UA: NEGATIVE
Ketones, UA: NEGATIVE
NITRITE UA: NEGATIVE
Protein, UA: NEGATIVE
RBC UA: NEGATIVE

## 2017-01-06 MED ORDER — ASPIRIN EC 81 MG PO TBEC: 81.0000 mg | DELAYED_RELEASE_TABLET | Freq: Every day | ORAL | 6 refills | Status: DC

## 2017-01-06 MED ORDER — FUSION PLUS PO CAPS: 1.0000 | ORAL_CAPSULE | Freq: Every day | ORAL | 6 refills | Status: DC

## 2017-01-06 NOTE — Progress Notes (Addendum)
US 12+5 wks,measurements c/w dates,crl 68.25 mm,NB present,NT 2 mm,normal right ovary, simple left ovarian cyst 10.2 x 10.7 x 6.8 cm w/arterial and venous flow,fhr 157 bpm

## 2017-01-06 NOTE — Progress Notes (Signed)
[id number] 2366w5d Estimated Date of Delivery: 07/16/17  Blood pressure (!) 90/54, pulse 77, weight 141 lb (64 kg), last menstrual period 10/09/2016.   BP weight and urine results all reviewed and noted.  Please refer to the obstetrical flow sheet for the fundal height and fetal heart rate documentation:  US 12+5 wks,measurements c/w dates,crl 68.25 mm,NB present,NT 2 mm,normal right ovary, simple left ovarian cyst 10.2 x 10.7 x 6.8 cm w/arterial and venous flow,fhr 153 bpm  Patient denies any bleeding and no rupture of membranes symptoms or regular contractions. Patient is without complaints. Can't take iron  PNV/FE changed to Fusion Plus All questions were answered.    Orders Placed This Encounter  Procedures  . Maternal Screen, Integrated #1  . POCT urinalysis dipstick    Plan:  Continued routine obstetrical care, Start ASA 81mg   Return in about 4 weeks (around 02/03/2017) for 2nd IT, LROB.

## 2017-01-10 LAB — MATERNAL SCREEN, INTEGRATED #1
Crown Rump Length: 68.3 mm
GEST. AGE ON COLLECTION DATE: 12.9 wk
MATERNAL AGE AT EDD: 28.8 a
Nuchal Translucency (NT): 2 mm
Number of Fetuses: 1
PAPP-A VALUE: 2662.8 ng/mL
Weight: 141 [lb_av]

## 2017-02-03 ENCOUNTER — Encounter: Payer: Self-pay | Admitting: Advanced Practice Midwife

## 2017-02-03 ENCOUNTER — Ambulatory Visit (INDEPENDENT_AMBULATORY_CARE_PROVIDER_SITE_OTHER): Payer: Medicaid Other | Admitting: Advanced Practice Midwife

## 2017-02-03 VITALS — BP 110/60 | HR 83 | Wt 142.0 lb

## 2017-02-03 DIAGNOSIS — Z363 Encounter for antenatal screening for malformations: Secondary | ICD-10-CM

## 2017-02-03 DIAGNOSIS — Z1389 Encounter for screening for other disorder: Secondary | ICD-10-CM

## 2017-02-03 DIAGNOSIS — Z1379 Encounter for other screening for genetic and chromosomal anomalies: Secondary | ICD-10-CM

## 2017-02-03 DIAGNOSIS — Z3482 Encounter for supervision of other normal pregnancy, second trimester: Secondary | ICD-10-CM

## 2017-02-03 DIAGNOSIS — Z331 Pregnant state, incidental: Secondary | ICD-10-CM

## 2017-02-03 LAB — POCT URINALYSIS DIPSTICK
GLUCOSE UA: NEGATIVE
KETONES UA: NEGATIVE
Leukocytes, UA: NEGATIVE
Nitrite, UA: NEGATIVE
Protein, UA: NEGATIVE
RBC UA: NEGATIVE

## 2017-02-03 NOTE — Patient Instructions (Signed)

## 2017-02-03 NOTE — Progress Notes (Signed)
[id number] 3647w5d Estimated Date of Delivery: 07/16/17  Blood pressure 110/60, pulse 83, weight 142 lb (64.4 kg), last menstrual period 10/09/2016.   BP weight and urine results all reviewed and noted.  Please refer to the obstetrical flow sheet for the fundal height and fetal heart rate documentation:  Patient denies any bleeding and no rupture of membranes symptoms or regular contractions. Patient is without complaints. All questions were answered.  Orders Placed This Encounter  Procedures  . US OB Comp + 14 Wk  . Maternal Screen, Integrated #2  . POCT urinalysis dipstick    Plan:  Continued routine obstetrical care, 2nd IT  Return in about 3 weeks (around 02/24/2017) for LROB, AV:[id number]:Anatomy.

## 2017-02-08 LAB — MATERNAL SCREEN, INTEGRATED #2
ADSF: 1.21
AFP MoM: 0.8
Alpha-Fetoprotein: 31.9 ng/mL
CROWN RUMP LENGTH: 68.3 mm
DIA MOM: 1.08
DIA Value: 185.3 pg/mL
ESTRIOL UNCONJUGATED: 1.13 ng/mL
GEST. AGE ON COLLECTION DATE: 12.9 wk
Gestational Age: 16.9 weeks
HCG MOM: 0.95
Maternal Age at EDD: 28.8 yr
NUCHAL TRANSLUCENCY (NT): 2 mm
NUCHAL TRANSLUCENCY MOM: 1.18
NUMBER OF FETUSES: 1
PAPP-A MoM: 2.32
PAPP-A Value: 2662.8 ng/mL
TEST RESULTS: NEGATIVE
Weight: 141 [lb_av]
Weight: 141 [lb_av]
hCG Value: 28 IU/mL

## 2017-02-24 ENCOUNTER — Ambulatory Visit (INDEPENDENT_AMBULATORY_CARE_PROVIDER_SITE_OTHER): Payer: Medicaid Other

## 2017-02-24 ENCOUNTER — Other Ambulatory Visit: Payer: Self-pay | Admitting: Advanced Practice Midwife

## 2017-02-24 ENCOUNTER — Encounter: Payer: Self-pay | Admitting: Women's Health

## 2017-02-24 ENCOUNTER — Ambulatory Visit (INDEPENDENT_AMBULATORY_CARE_PROVIDER_SITE_OTHER): Payer: Medicaid Other | Admitting: Women's Health

## 2017-02-24 VITALS — BP 110/70 | HR 88 | Wt 143.6 lb

## 2017-02-24 DIAGNOSIS — Z1389 Encounter for screening for other disorder: Secondary | ICD-10-CM

## 2017-02-24 DIAGNOSIS — Z363 Encounter for antenatal screening for malformations: Secondary | ICD-10-CM | POA: Diagnosis not present

## 2017-02-24 DIAGNOSIS — N83202 Unspecified ovarian cyst, left side: Secondary | ICD-10-CM

## 2017-02-24 DIAGNOSIS — Z3402 Encounter for supervision of normal first pregnancy, second trimester: Secondary | ICD-10-CM

## 2017-02-24 DIAGNOSIS — Z3A19 19 weeks gestation of pregnancy: Secondary | ICD-10-CM

## 2017-02-24 DIAGNOSIS — O09892 Supervision of other high risk pregnancies, second trimester: Secondary | ICD-10-CM

## 2017-02-24 DIAGNOSIS — Z3482 Encounter for supervision of other normal pregnancy, second trimester: Secondary | ICD-10-CM

## 2017-02-24 DIAGNOSIS — Z331 Pregnant state, incidental: Secondary | ICD-10-CM

## 2017-02-24 LAB — POCT URINALYSIS DIPSTICK
Blood, UA: NEGATIVE
Glucose, UA: NEGATIVE
KETONES UA: NEGATIVE
LEUKOCYTES UA: NEGATIVE
NITRITE UA: NEGATIVE

## 2017-02-24 NOTE — Progress Notes (Signed)
Low-risk OB appointment [id number] 986w5d Estimated Date of Delivery: 07/16/17 BP 110/70   Pulse 88   Wt 65.1 kg (143 lb 9.6 oz)   LMP 10/09/2016 (Approximate)   BMI 23.90 kg/m   BP, weight, and urine reviewed.  Refer to obstetrical flow sheet for FH & FHR.  Reports good fm.  Denies regular uc's, lof, vb, or uti s/s. No complaints. Reviewed today's normal anatomy u/s, ovarian cyst stable, normal nt/it, warning s/s, fm. Plan:  Continue routine obstetrical care  F/U in 4wks for OB appointment

## 2017-02-24 NOTE — Patient Instructions (Signed)

## 2017-02-24 NOTE — Progress Notes (Signed)
US 19+5 wks,cephalic,post pl gr 0,normal right ovary,simple left ovarian cyst 10.3 x 8 x 8.1 cm w/arterial and venous flow,cx 4.8 cm,svp of fluid 4.8 cm,fhr 152 bpm,EFW 327 g,anatomy complete

## 2017-03-30 ENCOUNTER — Telehealth: Payer: Self-pay | Admitting: *Deleted

## 2017-03-30 NOTE — Telephone Encounter (Signed)
Patient called stating she checked her BP at school and it was 110/38. Informed patient that her previous BP's were in that range so it did not sound concerning but if she became symptomatic to let us know. Verbalized understanding.

## 2017-03-31 ENCOUNTER — Encounter: Payer: Medicaid Other | Admitting: Advanced Practice Midwife

## 2017-04-07 ENCOUNTER — Encounter: Payer: Self-pay | Admitting: Women's Health

## 2017-04-07 ENCOUNTER — Ambulatory Visit (INDEPENDENT_AMBULATORY_CARE_PROVIDER_SITE_OTHER): Payer: Medicaid Other | Admitting: Women's Health

## 2017-04-07 VITALS — BP 104/52 | HR 83 | Wt 146.0 lb

## 2017-04-07 DIAGNOSIS — Z3482 Encounter for supervision of other normal pregnancy, second trimester: Secondary | ICD-10-CM

## 2017-04-07 DIAGNOSIS — Z1389 Encounter for screening for other disorder: Secondary | ICD-10-CM

## 2017-04-07 DIAGNOSIS — Z3A25 25 weeks gestation of pregnancy: Secondary | ICD-10-CM

## 2017-04-07 DIAGNOSIS — Z331 Pregnant state, incidental: Secondary | ICD-10-CM

## 2017-04-07 LAB — POCT URINALYSIS DIPSTICK
Glucose, UA: NEGATIVE
Ketones, UA: NEGATIVE
LEUKOCYTES UA: NEGATIVE
NITRITE UA: NEGATIVE
RBC UA: NEGATIVE

## 2017-04-07 NOTE — Progress Notes (Signed)
Low-risk OB appointment [id number] 5021w5d Estimated Date of Delivery: 07/16/17 BP (!) 104/52   Pulse 83   Wt 146 lb (66.2 kg)   LMP 10/09/2016 (Approximate)   BMI 24.30 kg/m   BP, weight, and urine reviewed.  Refer to obstetrical flow sheet for FH & FHR.  Reports good fm.  Denies regular uc's, lof, vb, or uti s/s. Some occ dizzy spells- discussed and gave printed info.  Reviewed ptl s/s, fm. Plan:  Continue routine obstetrical care  F/U in 3wks for OB appointment and pn2

## 2017-04-07 NOTE — Patient Instructions (Addendum)
You will have your sugar test next visit.  Please do not eat or drink anything after midnight the night before you come, not even water.  You will be here for at least two hours.     Call the office [phone]) or go to [facility] if:  You begin to have strong, frequent contractions  Your water breaks.  Sometimes it is a big gush of fluid, sometimes it is just a trickle that keeps getting your panties wet or running down your legs  You have vaginal bleeding.  It is normal to have a small amount of spotting if your cervix was checked.   You don't feel your baby moving like normal.  If you don't, get you something to eat and drink and lay down and focus on feeling your baby move.   If your baby is still not moving like normal, you should call the office or go to [facility].  For Dizzy Spells:   This is usually related to either your blood sugar or your blood pressure dropping  Make sure you are staying well hydrated and drinking enough water so that your urine is clear  Eat small frequent meals and snacks containing protein (meat, eggs, nuts, cheese) so that your blood sugar doesn't drop  If you do get dizzy, sit/lay down and get you something to drink and a snack containing protein- you will usually start feeling better in 10-20 minutes     Second Trimester of Pregnancy The second trimester is from week 13 through week 28, months 4 through 6. The second trimester is often a time when you feel your best. Your body has also adjusted to being pregnant, and you begin to feel better physically. Usually, morning sickness has lessened or quit completely, you may have more energy, and you may have an increase in appetite. The second trimester is also a time when the fetus is growing rapidly. At the end of the sixth month, the fetus is about 9 inches long and weighs about 1 pounds. You will likely begin to feel the baby move (quickening) between 18 and 20 weeks of the pregnancy. BODY  CHANGES Your body goes through many changes during pregnancy. The changes vary from woman to woman.   Your weight will continue to increase. You will notice your lower abdomen bulging out.  You may begin to get stretch marks on your hips, abdomen, and breasts.  You may develop headaches that can be relieved by medicines approved by your health care provider.  You may urinate more often because the fetus is pressing on your bladder.  You may develop or continue to have heartburn as a result of your pregnancy.  You may develop constipation because certain hormones are causing the muscles that push waste through your intestines to slow down.  You may develop hemorrhoids or swollen, bulging veins (varicose veins).  You may have back pain because of the weight gain and pregnancy hormones relaxing your joints between the bones in your pelvis and as a result of a shift in weight and the muscles that support your balance.  Your breasts will continue to grow and be tender.  Your gums may bleed and may be sensitive to brushing and flossing.  Dark spots or blotches (chloasma, mask of pregnancy) may develop on your face. This will likely fade after the baby is born.  A dark line from your belly button to the pubic area (linea nigra) may appear. This will likely fade after the  baby is born.  You may have changes in your hair. These can include thickening of your hair, rapid growth, and changes in texture. Some women also have hair loss during or after pregnancy, or hair that feels dry or thin. Your hair will most likely return to normal after your baby is born. WHAT TO EXPECT AT YOUR PRENATAL VISITS During a routine prenatal visit:  You will be weighed to make sure you and the fetus are growing normally.  Your blood pressure will be taken.  Your abdomen will be measured to track your baby's growth.  The fetal heartbeat will be listened to.  Any test results from the previous visit will be  discussed. Your health care provider may ask you:  How you are feeling.  If you are feeling the baby move.  If you have had any abnormal symptoms, such as leaking fluid, bleeding, severe headaches, or abdominal cramping.  If you have any questions. Other tests that may be performed during your second trimester include:  Blood tests that check for:  Low iron levels (anemia).  Gestational diabetes (between 24 and 28 weeks).  Rh antibodies.  Urine tests to check for infections, diabetes, or protein in the urine.  An ultrasound to confirm the proper growth and development of the baby.  An amniocentesis to check for possible genetic problems.  Fetal screens for spina bifida and Down syndrome. HOME CARE INSTRUCTIONS   Avoid all smoking, herbs, alcohol, and unprescribed drugs. These chemicals affect the formation and growth of the baby.  Follow your health care provider's instructions regarding medicine use. There are medicines that are either safe or unsafe to take during pregnancy.  Exercise only as directed by your health care provider. Experiencing uterine cramps is a good sign to stop exercising.  Continue to eat regular, healthy meals.  Wear a good support bra for breast tenderness.  Do not use hot tubs, steam rooms, or saunas.  Wear your seat belt at all times when driving.  Avoid raw meat, uncooked cheese, cat litter boxes, and soil used by cats. These carry germs that can cause birth defects in the baby.  Take your prenatal vitamins.  Try taking a stool softener (if your health care provider approves) if you develop constipation. Eat more high-fiber foods, such as fresh vegetables or fruit and whole grains. Drink plenty of fluids to keep your urine clear or pale yellow.  Take warm sitz baths to soothe any pain or discomfort caused by hemorrhoids. Use hemorrhoid cream if your health care provider approves.  If you develop varicose veins, wear support hose. Elevate  your feet for 15 minutes, 3-4 times a day. Limit salt in your diet.  Avoid heavy lifting, wear low heel shoes, and practice good posture.  Rest with your legs elevated if you have leg cramps or low back pain.  Visit your dentist if you have not gone yet during your pregnancy. Use a soft toothbrush to brush your teeth and be gentle when you floss.  A sexual relationship may be continued unless your health care provider directs you otherwise.  Continue to go to all your prenatal visits as directed by your health care provider. SEEK MEDICAL CARE IF:   You have dizziness.  You have mild pelvic cramps, pelvic pressure, or nagging pain in the abdominal area.  You have persistent nausea, vomiting, or diarrhea.  You have a bad smelling vaginal discharge.  You have pain with urination. SEEK IMMEDIATE MEDICAL CARE IF:   You  have a fever.  You are leaking fluid from your vagina.  You have spotting or bleeding from your vagina.  You have severe abdominal cramping or pain.  You have rapid weight gain or loss.  You have shortness of breath with chest pain.  You notice sudden or extreme swelling of your face, hands, ankles, feet, or legs.  You have not felt your baby move in over an hour.  You have severe headaches that do not go away with medicine.  You have vision changes. Document Released: 07/20/2001 Document Revised: 07/31/2013 Document Reviewed: 09/26/2012 ExitCare Patient Information 2015 [employer]. This information is not intended to replace advice given to you by your health care provider. Make sure you discuss any questions you have with your health care provider.     

## 2017-04-29 ENCOUNTER — Other Ambulatory Visit: Payer: Medicaid Other

## 2017-04-29 ENCOUNTER — Ambulatory Visit (INDEPENDENT_AMBULATORY_CARE_PROVIDER_SITE_OTHER): Payer: Medicaid Other | Admitting: Obstetrics & Gynecology

## 2017-04-29 ENCOUNTER — Encounter: Payer: Self-pay | Admitting: Obstetrics & Gynecology

## 2017-04-29 VITALS — BP 90/50 | HR 78 | Wt 147.0 lb

## 2017-04-29 DIAGNOSIS — Z3482 Encounter for supervision of other normal pregnancy, second trimester: Secondary | ICD-10-CM

## 2017-04-29 DIAGNOSIS — Z131 Encounter for screening for diabetes mellitus: Secondary | ICD-10-CM

## 2017-04-29 DIAGNOSIS — Z331 Pregnant state, incidental: Secondary | ICD-10-CM

## 2017-04-29 DIAGNOSIS — Z3A28 28 weeks gestation of pregnancy: Secondary | ICD-10-CM

## 2017-04-29 DIAGNOSIS — Z3483 Encounter for supervision of other normal pregnancy, third trimester: Secondary | ICD-10-CM

## 2017-04-29 DIAGNOSIS — Z1389 Encounter for screening for other disorder: Secondary | ICD-10-CM

## 2017-04-29 DIAGNOSIS — Z23 Encounter for immunization: Secondary | ICD-10-CM | POA: Diagnosis not present

## 2017-04-29 LAB — POCT URINALYSIS DIPSTICK
Blood, UA: NEGATIVE
Glucose, UA: NEGATIVE
Ketones, UA: NEGATIVE
LEUKOCYTES UA: NEGATIVE
NITRITE UA: NEGATIVE
PROTEIN UA: NEGATIVE

## 2017-04-29 NOTE — Progress Notes (Signed)
[id number] [redacted]w[redacted]d Estimated Date of Delivery: 07/16/17  Blood pressure (!) 90/50, pulse 78, weight 147 lb (66.7 kg), last menstrual period 10/09/2016.   BP weight and urine results all reviewed and noted.  Please refer to the obstetrical flow sheet for the fundal height and fetal heart rate documentation:  Patient reports good fetal movement, denies any bleeding and no rupture of membranes symptoms or regular contractions. Patient is without complaints. All questions were answered.  Orders Placed This Encounter  Procedures  . Flu Vaccine QUAD 36+ mos IM  . POCT urinalysis dipstick    Plan:  Continued routine obstetrical care, PN2 + flu shot today    Return in about 3 weeks (around 05/20/2017) for LROB.

## 2017-04-30 ENCOUNTER — Emergency Department (HOSPITAL_COMMUNITY)
Admission: EM | Admit: 2017-04-30 | Discharge: 2017-04-30 | Disposition: A | Discharge status: Home / Self Care | Payer: Medicaid Other | Attending: Emergency Medicine | Admitting: Emergency Medicine

## 2017-04-30 ENCOUNTER — Encounter (HOSPITAL_COMMUNITY): Payer: Self-pay | Admitting: Emergency Medicine

## 2017-04-30 DIAGNOSIS — F17210 Nicotine dependence, cigarettes, uncomplicated: Secondary | ICD-10-CM | POA: Diagnosis not present

## 2017-04-30 DIAGNOSIS — R42 Dizziness and giddiness: Secondary | ICD-10-CM | POA: Diagnosis not present

## 2017-04-30 LAB — CBC WITH DIFFERENTIAL/PLATELET
BASOS ABS: 0 10*3/uL (ref 0.0–0.1)
BASOS PCT: 0 %
Eosinophils Absolute: 0.1 10*3/uL (ref 0.0–0.7)
Eosinophils Relative: 1 %
HEMATOCRIT: 26.7 % — AB (ref 36.0–46.0)
HEMOGLOBIN: 9.2 g/dL — AB (ref 12.0–15.0)
Lymphocytes Relative: 17 %
Lymphs Abs: 1 10*3/uL (ref 0.7–4.0)
MCH: 29.9 pg (ref 26.0–34.0)
MCHC: 34.5 g/dL (ref 30.0–36.0)
MCV: 86.7 fL (ref 78.0–100.0)
Monocytes Absolute: 0.5 10*3/uL (ref 0.1–1.0)
Monocytes Relative: 9 %
NEUTROS ABS: 4.3 10*3/uL (ref 1.7–7.7)
NEUTROS PCT: 73 %
Platelets: 220 10*3/uL (ref 150–400)
RBC: 3.08 MIL/uL — AB (ref 3.87–5.11)
RDW: 13.3 % (ref 11.5–15.5)
WBC: 5.9 10*3/uL (ref 4.0–10.5)

## 2017-04-30 LAB — GLUCOSE TOLERANCE, 2 HOURS W/ 1HR
Glucose, 1 hour: 104 mg/dL (ref 65–179)
Glucose, 2 hour: 96 mg/dL (ref 65–152)
Glucose, Fasting: 69 mg/dL (ref 65–91)

## 2017-04-30 LAB — URINALYSIS, ROUTINE W REFLEX MICROSCOPIC
Bilirubin Urine: NEGATIVE
GLUCOSE, UA: NEGATIVE mg/dL
Hgb urine dipstick: NEGATIVE
Ketones, ur: NEGATIVE mg/dL
LEUKOCYTES UA: NEGATIVE
NITRITE: NEGATIVE
PH: 6 (ref 5.0–8.0)
Protein, ur: NEGATIVE mg/dL
SPECIFIC GRAVITY, URINE: 1.005 (ref 1.005–1.030)

## 2017-04-30 LAB — BASIC METABOLIC PANEL
ANION GAP: 9 (ref 5–15)
BUN: 7 mg/dL (ref 6–20)
CALCIUM: 8.5 mg/dL — AB (ref 8.9–10.3)
CHLORIDE: 105 mmol/L (ref 101–111)
CO2: 21 mmol/L — ABNORMAL LOW (ref 22–32)
CREATININE: 0.51 mg/dL (ref 0.44–1.00)
GFR calc non Af Amer: >60 mL/min (ref >60)
Glucose, Bld: 103 mg/dL — ABNORMAL HIGH (ref 65–99)
Potassium: 3.4 mmol/L — ABNORMAL LOW (ref 3.5–5.1)
Sodium: 135 mmol/L (ref 135–145)

## 2017-04-30 LAB — CBC
HEMOGLOBIN: 9.9 g/dL — AB (ref 11.1–15.9)
Hematocrit: 28.8 % — ABNORMAL LOW (ref 34.0–46.6)
MCH: 28.6 pg (ref 26.6–33.0)
MCHC: 34.4 g/dL (ref 31.5–35.7)
MCV: 83 fL (ref 79–97)
Platelets: 228 10*3/uL (ref 150–379)
RBC: 3.46 x10E6/uL — AB (ref 3.77–5.28)
RDW: 13.7 % (ref 12.3–15.4)
WBC: 6.5 10*3/uL (ref 3.4–10.8)

## 2017-04-30 LAB — ANTIBODY SCREEN: Antibody Screen: NEGATIVE

## 2017-04-30 LAB — RPR: RPR: NONREACTIVE

## 2017-04-30 LAB — HIV ANTIBODY (ROUTINE TESTING W REFLEX): HIV Screen 4th Generation wRfx: NONREACTIVE

## 2017-04-30 MED ORDER — SODIUM CHLORIDE 0.9 % IV BOLUS (SEPSIS)
1000.0000 mL | Freq: Once | INTRAVENOUS | Status: AC
  Administered 2017-04-30: 1000 mL via INTRAVENOUS

## 2017-04-30 NOTE — Discharge Instructions (Signed)
Follow up with your md as planned.  Drink plenty of fluids

## 2017-04-30 NOTE — ED Triage Notes (Signed)
Pt reports being in kitchen cooking, getting hot, and feeling like she was going to pass out.  States she has only drank one bottle of water today and everytime she stands she gets dizzy.

## 2017-04-30 NOTE — Progress Notes (Signed)
Received call from [facility] ED with patient 29w gestation, G4P2 to ER with dizziness. ED looking to DC patient, Ed made aware that [facility] unaware of patients ER admission and strip needs to be reviewed. Patient adm into [facility] and awaiting 20 minute EFM tracing. Dr. Macon Large made aware.

## 2017-04-30 NOTE — ED Provider Notes (Signed)
[facility]-EM[facility]ENCY DEPT Provider Note   CSN: [id number] Arrival date & time: 04/30/17  1801     History   Chief Complaint Chief Complaint  Patient presents with  . Dizziness    HPI Carrie Mckenzie is a 26 y.o. female.  Patient states that she has some dizziness today but did not pass out patient is pregnant   The history is provided by the patient. No language interpreter was used.  Dizziness  Quality:  Lightheadedness Severity:  Mild Onset quality:  Sudden Timing:  Intermittent Progression:  Resolved Chronicity:  New Context: bending over   Relieved by:  Nothing Associated symptoms: no chest pain, no diarrhea and no headaches     Past Medical History:  Diagnosis Date  . Anxiety   . BV (bacterial vaginosis) 12/17/2013  . Ovarian cyst   . Vaginal discharge 12/17/2013    Patient Active Problem List   Diagnosis Date Noted  . Supervision of normal pregnancy 12/16/2016  . Left ovarian cyst 12/16/2016  . Smoker 12/16/2016  . History of postpartum hypertension 05/13/2014  . BV (bacterial vaginosis) 12/17/2013  . Rubella non-immune status, antepartum 08/28/2013  . Anxiety 08/27/2013    Past Surgical History:  Procedure Laterality Date  . CHOLECYSTECTOMY    . WISDOM TOOTH EXTRACTION      OB History    Gravida Para Term Preterm AB Living   0 1 2   SAB TAB Ectopic Multiple Live Births   1 0 0 0 2       Home Medications    Prior to Admission medications   Not on File    Family History Family History  Problem Relation Age of Onset  . Diabetes Mother   . Thyroid disease Mother        had thyroid removed  . Hypertension Mother   . Hypertension Father   . Cancer Father        prostate  . Depression Father   . Anxiety disorder Father   . Cirrhosis Father   . COPD Father   . Kidney disease Father   . Diabetes Maternal Aunt   . Diabetes Maternal Uncle   . Cancer Maternal Grandmother        ovarian, uterine  . Diabetes Maternal Aunt   .  Diabetes Maternal Aunt   . Diabetes Maternal Uncle   . Diabetes Maternal Uncle   . Diabetes Maternal Uncle   . Diabetes Maternal Uncle   . Diabetes Maternal Uncle   . Diabetes Maternal Uncle   . ADD / ADHD Daughter   . COPD Maternal Grandfather   . Emphysema Maternal Grandfather   . Asthma Paternal Aunt     Social History Social History  Substance Use Topics  . Smoking status: Current Every Day Smoker    Packs/day: 0.50    Years: 10.00    Types: Cigarettes  . Smokeless tobacco: Never Used  . Alcohol use No     Comment: occassionally     Allergies   Patient has no known allergies.   Review of Systems Review of Systems  Constitutional: Negative for appetite change and fatigue.  HENT: Negative for congestion, ear discharge and sinus pressure.   Eyes: Negative for discharge.  Respiratory: Negative for cough.   Cardiovascular: Negative for chest pain.  Gastrointestinal: Negative for abdominal pain and diarrhea.  Genitourinary: Negative for frequency and hematuria.  Musculoskeletal: Negative for back pain.  Skin: Negative for rash.  Neurological: Positive for dizziness. Negative for  seizures and headaches.  Psychiatric/Behavioral: Negative for hallucinations.     Physical Exam Updated Vital Signs BP (!) 108/56   Pulse 72   Temp 98.2 F (36.8 C) (Oral)   Resp 16   Ht  (1.651 m)   Wt 66.7 kg (147 lb)   LMP 10/09/2016 (Approximate)   SpO2 100%   BMI 24.46 kg/m   Physical Exam  Constitutional: She is oriented to person, place, and time. She appears well-developed.  HENT:  Head: Normocephalic.  Eyes: Conjunctivae and EOM are normal. No scleral icterus.  Neck: Neck supple. No thyromegaly present.  Cardiovascular: Normal rate and regular rhythm.  Exam reveals no gallop and no friction rub.   No murmur heard. Pulmonary/Chest: No stridor. She has no wheezes. She has no rales. She exhibits no tenderness.  Abdominal: She exhibits no distension. There is no  tenderness. There is no rebound.  Musculoskeletal: Normal range of motion. She exhibits no edema.  Lymphadenopathy:    She has no cervical adenopathy.  Neurological: She is oriented to person, place, and time. She exhibits normal muscle tone. Coordination normal.  Skin: No rash noted. No erythema.  Psychiatric: She has a normal mood and affect. Her behavior is normal.     ED Treatments / Results  Labs (all labs ordered are listed, but only abnormal results are displayed) Labs Reviewed  CBC WITH DIFFERENTIAL/PLATELET - Abnormal; Notable for the following:       Result Value   RBC 3.08 (*)    Hemoglobin 9.2 (*)    HCT 26.7 (*)    All other components within normal limits  BASIC METABOLIC PANEL - Abnormal; Notable for the following:    Potassium 3.4 (*)    CO2 21 (*)    Glucose, Bld 103 (*)    Calcium 8.5 (*)    All other components within normal limits  URINALYSIS, ROUTINE W REFLEX MICROSCOPIC    EKG  EKG Interpretation None       Radiology No results found.  Procedures Procedures (including critical care time)  Medications Ordered in ED Medications  sodium chloride 0.9 % bolus 1,000 mL (0 mLs Intravenous Stopped 04/30/17 2015)   .  Initial Impression / Assessment and Plan / ED Course  I have reviewed the triage vital signs and the nursing notes.  Pertinent labs & imaging results that were available during my care of the patient were reviewed by me and considered in my medical decision making (see chart for details).    patient improved with IV fluids. Lab tests unremarkable. Suspect near syncopal episodes related to the pregnancy and dehydration possibly hypoglycemic episode patient will follow-up with her ob-gyn Final Clinical Impressions(s) / ED Diagnoses   Final diagnoses:  Dizziness    New Prescriptions There are no discharge medications for this patient.    Bethann Berkshire, MD 04/30/17 2149

## 2017-04-30 NOTE — Progress Notes (Signed)
EFM tracing reviewed, Dr Macon Large aware of tracing and patient OB cleared.

## 2017-04-30 NOTE — ED Notes (Signed)
Pt discharged from ED.  AVS reviewed with verbalized understandings.

## 2017-05-05 ENCOUNTER — Encounter: Payer: Self-pay | Admitting: *Deleted

## 2017-05-05 ENCOUNTER — Other Ambulatory Visit: Payer: Self-pay | Admitting: Women's Health

## 2017-05-05 ENCOUNTER — Telehealth: Payer: Self-pay | Admitting: *Deleted

## 2017-05-05 MED ORDER — FERROUS SULFATE 325 (65 FE) MG PO TABS
325.0000 mg | ORAL_TABLET | Freq: Two times a day (BID) | ORAL | 3 refills | Status: DC
Start: 2017-05-05 — End: 2017-08-26

## 2017-05-05 NOTE — Telephone Encounter (Signed)
Patient phone unable to accept calls. Will send mychart message.

## 2017-05-19 ENCOUNTER — Encounter: Payer: Medicaid Other | Admitting: Obstetrics and Gynecology

## 2017-05-20 ENCOUNTER — Encounter: Payer: Medicaid Other | Admitting: Obstetrics and Gynecology

## 2017-05-20 ENCOUNTER — Encounter: Payer: Self-pay | Admitting: *Deleted

## 2017-05-31 ENCOUNTER — Ambulatory Visit (INDEPENDENT_AMBULATORY_CARE_PROVIDER_SITE_OTHER): Payer: Medicaid Other | Admitting: Obstetrics & Gynecology

## 2017-05-31 VITALS — BP 112/58 | HR 87 | Wt 154.0 lb

## 2017-05-31 DIAGNOSIS — Z3483 Encounter for supervision of other normal pregnancy, third trimester: Secondary | ICD-10-CM

## 2017-05-31 DIAGNOSIS — Z331 Pregnant state, incidental: Secondary | ICD-10-CM

## 2017-05-31 DIAGNOSIS — Z1389 Encounter for screening for other disorder: Secondary | ICD-10-CM

## 2017-05-31 DIAGNOSIS — Z3A33 33 weeks gestation of pregnancy: Secondary | ICD-10-CM

## 2017-05-31 LAB — POCT URINALYSIS DIPSTICK
Blood, UA: NEGATIVE
GLUCOSE UA: NEGATIVE
Ketones, UA: NEGATIVE
Leukocytes, UA: NEGATIVE
NITRITE UA: NEGATIVE
Protein, UA: NEGATIVE

## 2017-05-31 NOTE — Progress Notes (Signed)
[id number] 249w3d Estimated Date of Delivery: 07/16/17  Blood pressure (!) 112/58, pulse 87, weight 154 lb (69.9 kg), last menstrual period 10/09/2016.   BP weight and urine results all reviewed and noted.  Please refer to the obstetrical flow sheet for the fundal height and fetal heart rate documentation:  Patient reports good fetal movement, denies any bleeding and no rupture of membranes symptoms or regular contractions. Patient is without complaints. All questions were answered.  Orders Placed This Encounter  Procedures  . POCT Urinalysis Dipstick    Plan:  Continued routine obstetrical care,   No Follow-up on file.

## 2017-06-16 ENCOUNTER — Encounter: Payer: Self-pay | Admitting: Women's Health

## 2017-06-16 ENCOUNTER — Ambulatory Visit (INDEPENDENT_AMBULATORY_CARE_PROVIDER_SITE_OTHER): Payer: Medicaid Other | Admitting: Women's Health

## 2017-06-16 VITALS — BP 118/66 | HR 94 | Wt 155.0 lb

## 2017-06-16 DIAGNOSIS — Z331 Pregnant state, incidental: Secondary | ICD-10-CM

## 2017-06-16 DIAGNOSIS — Z3A35 35 weeks gestation of pregnancy: Secondary | ICD-10-CM

## 2017-06-16 DIAGNOSIS — N83202 Unspecified ovarian cyst, left side: Secondary | ICD-10-CM

## 2017-06-16 DIAGNOSIS — Z1389 Encounter for screening for other disorder: Secondary | ICD-10-CM

## 2017-06-16 DIAGNOSIS — Z3483 Encounter for supervision of other normal pregnancy, third trimester: Secondary | ICD-10-CM

## 2017-06-16 LAB — POCT URINALYSIS DIPSTICK
GLUCOSE UA: NEGATIVE
KETONES UA: NEGATIVE
Leukocytes, UA: NEGATIVE
Nitrite, UA: NEGATIVE
PROTEIN UA: NEGATIVE
RBC UA: NEGATIVE

## 2017-06-16 NOTE — Patient Instructions (Signed)
Carrie Mckenzie, I greatly value your feedback.  If you receive a survey following your visit with us today, we appreciate you taking the time to fill it out.  Thanks, Carrie Mckenzie, CNM, WHNP-BC   Call the office [phone]) or go to [facility] if:  You begin to have strong, frequent contractions  Your water breaks.  Sometimes it is a big gush of fluid, sometimes it is just a trickle that keeps getting your panties wet or running down your legs  You have vaginal bleeding.  It is normal to have a small amount of spotting if your cervix was checked.   You don't feel your baby moving like normal.  If you don't, get you something to eat and drink and lay down and focus on feeling your baby move.  You should feel at least 10 movements in 2 hours.  If you don't, you should call the office or go to [facility].    Preterm Labor and Birth Information The normal length of a pregnancy is 39-41 weeks. Preterm labor is when labor starts before 37 completed weeks of pregnancy. What are the risk factors for preterm labor? Preterm labor is more likely to occur in women who:  Have certain infections during pregnancy such as a bladder infection, sexually transmitted infection, or infection inside the uterus (chorioamnionitis).  Have a shorter-than-normal cervix.  Have gone into preterm labor before.  Have had surgery on their cervix.  Are younger than age 28 or older than age 28.  Are African American.  Are pregnant with twins or multiple babies (multiple gestation).  Take street drugs or smoke while pregnant.  Do not gain enough weight while pregnant.  Became pregnant shortly after having been pregnant.  What are the symptoms of preterm labor? Symptoms of preterm labor include:  Cramps similar to those that can happen during a menstrual period. The cramps may happen with diarrhea.  Pain in the abdomen or lower back.  Regular uterine contractions that may feel like tightening of  the abdomen.  A feeling of increased pressure in the pelvis.  Increased watery or bloody mucus discharge from the vagina.  Water breaking (ruptured amniotic sac).  Why is it important to recognize signs of preterm labor? It is important to recognize signs of preterm labor because babies who are born prematurely may not be fully developed. This can put them at an increased risk for:  Long-term (chronic) heart and lung problems.  Difficulty immediately after birth with regulating body systems, including blood sugar, body temperature, heart rate, and breathing rate.  Bleeding in the brain.  Cerebral palsy.  Learning difficulties.  Death.  These risks are highest for babies who are born before 34 weeks of pregnancy. How is preterm labor treated? Treatment depends on the length of your pregnancy, your condition, and the health of your baby. It may involve:  Having a stitch (suture) placed in your cervix to prevent your cervix from opening too early (cerclage).  Taking or being given medicines, such as: ? Hormone medicines. These may be given early in pregnancy to help support the pregnancy. ? Medicine to stop contractions. ? Medicines to help mature the baby's lungs. These may be prescribed if the risk of delivery is high. ? Medicines to prevent your baby from developing cerebral palsy.  If the labor happens before 34 weeks of pregnancy, you may need to stay in the hospital. What should I do if I think I am in preterm labor? If you think  that you are going into preterm labor, call your health care provider right away. How can I prevent preterm labor in future pregnancies? To increase your chance of having a full-term pregnancy:  Do not use any tobacco products, such as cigarettes, chewing tobacco, and e-cigarettes. If you need help quitting, ask your health care provider.  Do not use street drugs or medicines that have not been prescribed to you during your pregnancy.  Talk  with your health care provider before taking any herbal supplements, even if you have been taking them regularly.  Make sure you gain a healthy amount of weight during your pregnancy.  Watch for infection. If you think that you might have an infection, get it checked right away.  Make sure to tell your health care provider if you have gone into preterm labor before.  This information is not intended to replace advice given to you by your health care provider. Make sure you discuss any questions you have with your health care provider. Document Released: 10/16/2003 Document Revised: 01/06/2016 Document Reviewed: 12/17/2015 Elsevier Interactive Patient Education  2018 [employer]

## 2017-06-16 NOTE — Progress Notes (Signed)
   LOW-RISK PREGNANCY VISIT Patient name: Carrie Mckenzie MRN [id number]  Date of birth: 05/12/1991 Chief Complaint:   Routine Prenatal Visit (states she had a gush of fluid today)  History of Present Illness:   Carrie Mckenzie is a 26 y.o. [id number] female at 5366w5d with an Estimated Date of Delivery: 07/16/17 being seen today for ongoing management of a low-risk pregnancy.  Today she reports at clinicals earlier today and had gush of fluid, enough to go outside of her pantyliner she normally wears, none since.  Denies abnormal discharge, itching/odor/irritation.  .Contractions: Irregular. Vag. Bleeding: None.  Movement: Present. reports leaking of fluid. Review of Systems:   Pertinent items are noted in HPI Denies abnormal vaginal discharge w/ itching/odor/irritation, headaches, visual changes, shortness of breath, chest pain, abdominal pain, severe nausea/vomiting, or problems with urination or bowel movements unless otherwise stated above. Pertinent History Reviewed:  Reviewed past medical,surgical, social, obstetrical and family history.  Reviewed problem list, medications and allergies. Physical Assessment:   Vitals:   06/16/17 1543  BP: 118/66  Pulse: 94  Weight: 155 lb (70.3 kg)  Body mass index is 25.79 kg/m.        Physical Examination:   General appearance: Well appearing, and in no distress  Mental status: Alert, oriented to person, place, and time  Skin: Warm & dry  Cardiovascular: Normal heart rate noted  Respiratory: Normal respiratory effort, no distress  Abdomen: Soft, gravid, nontender  Pelvic: SSE: cx visually long & closed, no pooling, no change w/ valsalva, fern & nitrazine neg       Normal appearing/smelling discharge  Extremities: Edema: Trace  Fetal Status: Fetal Heart Rate (bpm): 147 Fundal Height: 33 cm Movement: Present    Results for orders placed or performed in visit on 06/16/17 (from the past 24 hour(s))  POCT urinalysis dipstick   Collection Time:  06/16/17  3:45 PM  Result Value Ref Range   Color, UA     Clarity, UA     Glucose, UA neg    Bilirubin, UA     Ketones, UA neg    Spec Grav, UA  1.010 - 1.025   Blood, UA neg    pH, UA  5.0 - 8.0   Protein, UA neg    Urobilinogen, UA  0.2 or 1.0 E.U./dL   Nitrite, UA neg    Leukocytes, UA Negative Negative    Assessment & Plan:  1) Low-risk pregnancy [id number] at 8066w5d with an Estimated Date of Delivery: 07/16/17   2) Gush of fluid earlier today, none since, doesn't appear to be amniotic fluid, pt to continue to monitor  Plan:  Continue routine obstetrical care   Reviewed: Preterm labor symptoms and general obstetric precautions including but not limited to vaginal bleeding, contractions, leaking of fluid and fetal movement were reviewed in detail with the patient.  All questions were answered  Follow-up: Return in about 1 week (around 06/23/2017) for LROB. and gbs  Orders Placed This Encounter  Procedures  . POCT urinalysis dipstick   Marge DuncansBooker, [employer] [place] CNM, [facility] 06/16/2017 4:17 PM

## 2017-06-23 ENCOUNTER — Encounter: Payer: Self-pay | Admitting: Advanced Practice Midwife

## 2017-06-23 ENCOUNTER — Ambulatory Visit (INDEPENDENT_AMBULATORY_CARE_PROVIDER_SITE_OTHER): Payer: Medicaid Other | Admitting: Advanced Practice Midwife

## 2017-06-23 VITALS — BP 124/60 | HR 95 | Wt 153.0 lb

## 2017-06-23 DIAGNOSIS — Z1389 Encounter for screening for other disorder: Secondary | ICD-10-CM

## 2017-06-23 DIAGNOSIS — Z3483 Encounter for supervision of other normal pregnancy, third trimester: Secondary | ICD-10-CM

## 2017-06-23 DIAGNOSIS — Z331 Pregnant state, incidental: Secondary | ICD-10-CM

## 2017-06-23 DIAGNOSIS — Z3A36 36 weeks gestation of pregnancy: Secondary | ICD-10-CM

## 2017-06-23 LAB — POCT URINALYSIS DIPSTICK
Blood, UA: NEGATIVE
GLUCOSE UA: NEGATIVE
KETONES UA: NEGATIVE
Leukocytes, UA: NEGATIVE
Nitrite, UA: NEGATIVE
Protein, UA: NEGATIVE

## 2017-06-23 LAB — OB RESULTS CONSOLE GBS: GBS: POSITIVE

## 2017-06-23 NOTE — Progress Notes (Signed)
LOW-RISK PREGNANCY VISIT Patient name: Carrie Mckenzie MRN [id number]  Date of birth: 03/26/1991 Chief Complaint:   Routine Prenatal Visit (GBS/GC today)  History of Present Illness:   Carrie Mckenzie is a 26 y.o. [phone] female at 7445w5d with an Estimated Date of Delivery: 07/16/17 being seen today for ongoing management of a low-risk pregnancy.  Today she reports no complaints. Contractions: Irregular. Vag. Bleeding: None.  Movement: Present. denies leaking of fluid. Review of Systems:   Pertinent items are noted in HPI Denies abnormal vaginal discharge w/ itching/odor/irritation, headaches, visual changes, shortness of breath, chest pain, abdominal pain, severe nausea/vomiting, or problems with urination or bowel movements unless otherwise stated above.  Pertinent History Reviewed:  Medical & Surgical Hx:   Past Medical History:  Diagnosis Date  . Anxiety   . BV (bacterial vaginosis) 12/17/2013  . Ovarian cyst   . Vaginal discharge 12/17/2013   Past Surgical History:  Procedure Laterality Date  . CHOLECYSTECTOMY    . WISDOM TOOTH EXTRACTION     Family History  Problem Relation Age of Onset  . Diabetes Mother   . Thyroid disease Mother        had thyroid removed  . Hypertension Mother   . Hypertension Father   . Cancer Father        prostate  . Depression Father   . Anxiety disorder Father   . Cirrhosis Father   . COPD Father   . Kidney disease Father   . Diabetes Maternal Aunt   . Diabetes Maternal Uncle   . Cancer Maternal Grandmother        ovarian, uterine  . Diabetes Maternal Aunt   . Diabetes Maternal Aunt   . Diabetes Maternal Uncle   . Diabetes Maternal Uncle   . Diabetes Maternal Uncle   . Diabetes Maternal Uncle   . Diabetes Maternal Uncle   . Diabetes Maternal Uncle   . ADD / ADHD Daughter   . COPD Maternal Grandfather   . Emphysema Maternal Grandfather   . Asthma Paternal Aunt     Current Outpatient Medications:  .  ferrous sulfate 325 (65 FE)  MG tablet, Take 1 tablet (325 mg total) by mouth 2 (two) times daily with a meal., Disp: 60 tablet, Rfl: 3 Social History: Reviewed -  reports that she has been smoking cigarettes.  She has a 5.00 pack-year smoking history. she has never used smokeless tobacco.  Physical Assessment:   Vitals:   06/23/17 1549  BP: 124/60  Pulse: 95  Weight: 153 lb (69.4 kg)  Body mass index is 25.46 kg/m.        Physical Examination:   General appearance: Well appearing, and in no distress  Mental status: Alert, oriented to person, place, and time  Skin: Warm & dry  Cardiovascular: Normal heart rate noted  Respiratory: Normal respiratory effort, no distress  Abdomen: Soft, gravid, nontender  Pelvic: Cervical exam performed  Dilation: 1.5 Effacement (%): Thick Station: Ballotable  Extremities: Edema: Trace  Fetal Status: Fetal Heart Rate (bpm): 130 Fundal Height: 34 cm Movement: Present Presentation: Vertex  Results for orders placed or performed in visit on 06/23/17 (from the past 24 hour(s))  POCT urinalysis dipstick   Collection Time: 06/23/17  3:50 PM  Result Value Ref Range   Color, UA     Clarity, UA     Glucose, UA neg    Bilirubin, UA     Ketones, UA neg    Spec Grav, UA  1.010 -  1.025   Blood, UA neg    pH, UA  5.0 - 8.0   Protein, UA neg    Urobilinogen, UA  0.2 or 1.0 E.U./dL   Nitrite, UA neg    Leukocytes, UA Negative Negative    Assessment & Plan:  1) Low-risk pregnancy [id number] at 7357w5d with an Estimated Date of Delivery: 07/16/17   2) , anemia; started back taking FeSo4 2 weeks ago   Labs/procedures/US today: doppler  Plan:  Continue routine obstetrical care    Follow-up: Return in about 1 week (around 06/30/2017) for LROB.  Orders Placed This Encounter  Procedures  . GC/Chlamydia Probe Amp  . Culture, beta strep (group b only)  . POCT urinalysis dipstick   CRESENZO-DISHMAN,Bruk Tumolo CNM 06/23/2017 4:32 PM

## 2017-06-23 NOTE — Patient Instructions (Signed)

## 2017-06-25 LAB — GC/CHLAMYDIA PROBE AMP
CHLAMYDIA, DNA PROBE: NEGATIVE
NEISSERIA GONORRHOEAE BY PCR: NEGATIVE

## 2017-06-26 LAB — CULTURE, BETA STREP (GROUP B ONLY): STREP GP B CULTURE: POSITIVE — AB

## 2017-06-29 ENCOUNTER — Ambulatory Visit (INDEPENDENT_AMBULATORY_CARE_PROVIDER_SITE_OTHER): Payer: Medicaid Other | Admitting: Advanced Practice Midwife

## 2017-06-29 ENCOUNTER — Encounter: Payer: Self-pay | Admitting: Advanced Practice Midwife

## 2017-06-29 VITALS — BP 118/56 | HR 88 | Wt 155.0 lb

## 2017-06-29 DIAGNOSIS — Z3483 Encounter for supervision of other normal pregnancy, third trimester: Secondary | ICD-10-CM | POA: Diagnosis not present

## 2017-06-29 DIAGNOSIS — Z331 Pregnant state, incidental: Secondary | ICD-10-CM

## 2017-06-29 DIAGNOSIS — Z1389 Encounter for screening for other disorder: Secondary | ICD-10-CM

## 2017-06-29 DIAGNOSIS — Z3A37 37 weeks gestation of pregnancy: Secondary | ICD-10-CM | POA: Diagnosis not present

## 2017-06-29 LAB — POCT URINALYSIS DIPSTICK
Glucose, UA: NEGATIVE
KETONES UA: NEGATIVE
Leukocytes, UA: NEGATIVE
NITRITE UA: NEGATIVE
Protein, UA: NEGATIVE
RBC UA: NEGATIVE

## 2017-06-29 NOTE — Progress Notes (Signed)
[id number] 3282w4d Estimated Date of Delivery: 07/16/17  Blood pressure (!) 118/56, pulse 88, weight 155 lb (70.3 kg), last menstrual period 10/09/2016.   BP weight and urine results all reviewed and noted.  Please refer to the obstetrical flow sheet for the fundal height and fetal heart rate documentation:  Patient reports good fetal movement, denies any bleeding and no rupture of membranes symptoms or regular contractions. Patient is without complaints. All questions were answered.  Orders Placed This Encounter  Procedures  . POCT urinalysis dipstick    Plan:  Continued routine obstetrical care,   Return in about 1 week (around 07/06/2017) for LROB.

## 2017-07-06 ENCOUNTER — Inpatient Hospital Stay (HOSPITAL_COMMUNITY): Payer: Medicaid Other | Admitting: Anesthesiology

## 2017-07-06 ENCOUNTER — Encounter (HOSPITAL_COMMUNITY): Payer: Self-pay | Admitting: *Deleted

## 2017-07-06 ENCOUNTER — Inpatient Hospital Stay (HOSPITAL_COMMUNITY)
Admission: AD | Admit: 2017-07-06 | Discharge: 2017-07-08 | DRG: 807 | Disposition: A | Discharge status: Home / Self Care | Payer: Medicaid Other | Source: Ambulatory Visit | Attending: Family Medicine | Admitting: Family Medicine

## 2017-07-06 DIAGNOSIS — O165 Unspecified maternal hypertension, complicating the puerperium: Secondary | ICD-10-CM

## 2017-07-06 DIAGNOSIS — Z3A38 38 weeks gestation of pregnancy: Secondary | ICD-10-CM

## 2017-07-06 DIAGNOSIS — N83202 Unspecified ovarian cyst, left side: Secondary | ICD-10-CM | POA: Diagnosis present

## 2017-07-06 DIAGNOSIS — O99334 Smoking (tobacco) complicating childbirth: Secondary | ICD-10-CM | POA: Diagnosis present

## 2017-07-06 DIAGNOSIS — O3483 Maternal care for other abnormalities of pelvic organs, third trimester: Secondary | ICD-10-CM | POA: Diagnosis present

## 2017-07-06 DIAGNOSIS — F17210 Nicotine dependence, cigarettes, uncomplicated: Secondary | ICD-10-CM | POA: Diagnosis present

## 2017-07-06 DIAGNOSIS — O99824 Streptococcus B carrier state complicating childbirth: Secondary | ICD-10-CM | POA: Diagnosis present

## 2017-07-06 DIAGNOSIS — Z3483 Encounter for supervision of other normal pregnancy, third trimester: Secondary | ICD-10-CM | POA: Diagnosis present

## 2017-07-06 LAB — TYPE AND SCREEN
ABO/RH(D): A POS
ANTIBODY SCREEN: NEGATIVE

## 2017-07-06 LAB — CBC
HCT: 31.4 % — ABNORMAL LOW (ref 36.0–46.0)
Hemoglobin: 9.9 g/dL — ABNORMAL LOW (ref 12.0–15.0)
MCH: 25.1 pg — ABNORMAL LOW (ref 26.0–34.0)
MCHC: 31.5 g/dL (ref 30.0–36.0)
MCV: 79.7 fL (ref 78.0–100.0)
PLATELETS: 289 10*3/uL (ref 150–400)
RBC: 3.94 MIL/uL (ref 3.87–5.11)
RDW: 15.8 % — AB (ref 11.5–15.5)
WBC: 11.5 10*3/uL — AB (ref 4.0–10.5)

## 2017-07-06 LAB — ABO/RH: ABO/RH(D): A POS

## 2017-07-06 LAB — RPR: RPR Ser Ql: NONREACTIVE

## 2017-07-06 MED ORDER — SODIUM BICARBONATE 8.4 % IV SOLN
INTRAVENOUS | Status: DC | PRN
  Administered 2017-07-06 (×2): 5 mL via EPIDURAL

## 2017-07-06 MED ORDER — FENTANYL 2.5 MCG/ML BUPIVACAINE 1/10 % EPIDURAL INFUSION (WH - ANES)
INTRAMUSCULAR | Status: AC
  Filled 2017-07-06: qty 100

## 2017-07-06 MED ORDER — LACTATED RINGERS IV SOLN: 500.0000 mL | Freq: Once | INTRAVENOUS | Status: DC

## 2017-07-06 MED ORDER — FENTANYL CITRATE (PF) 100 MCG/2ML IJ SOLN: 100.0000 ug | INTRAMUSCULAR | Status: DC | PRN

## 2017-07-06 MED ORDER — SENNOSIDES-DOCUSATE SODIUM 8.6-50 MG PO TABS
2.0000 | ORAL_TABLET | ORAL | Status: DC
  Administered 2017-07-06 – 2017-07-07 (×2): 2 via ORAL
  Filled 2017-07-06 (×3): qty 2

## 2017-07-06 MED ORDER — ACETAMINOPHEN 325 MG PO TABS: 650.0000 mg | ORAL_TABLET | ORAL | Status: DC | PRN

## 2017-07-06 MED ORDER — LACTATED RINGERS IV SOLN
INTRAVENOUS | Status: DC
  Administered 2017-07-06: 01:00:00 via INTRAVENOUS

## 2017-07-06 MED ORDER — BENZOCAINE-MENTHOL 20-0.5 % EX AERO
1.0000 "application " | INHALATION_SPRAY | CUTANEOUS | Status: DC | PRN
  Administered 2017-07-06: 1 via TOPICAL
  Filled 2017-07-06: qty 56

## 2017-07-06 MED ORDER — EPHEDRINE 5 MG/ML INJ
10.0000 mg | INTRAVENOUS | Status: DC | PRN
  Filled 2017-07-06: qty 2

## 2017-07-06 MED ORDER — WITCH HAZEL-GLYCERIN EX PADS
1.0000 | MEDICATED_PAD | CUTANEOUS | Status: DC | PRN
Start: 2017-07-06 — End: 2017-07-08

## 2017-07-06 MED ORDER — OXYTOCIN BOLUS FROM INFUSION
500.0000 mL | Freq: Once | INTRAVENOUS | Status: AC
  Administered 2017-07-06: 500 mL via INTRAVENOUS

## 2017-07-06 MED ORDER — ONDANSETRON HCL 4 MG/2ML IJ SOLN: 4.0000 mg | INTRAMUSCULAR | Status: DC | PRN

## 2017-07-06 MED ORDER — ONDANSETRON HCL 4 MG PO TABS: 4.0000 mg | ORAL_TABLET | ORAL | Status: DC | PRN

## 2017-07-06 MED ORDER — DIPHENHYDRAMINE HCL 25 MG PO CAPS: 25.0000 mg | ORAL_CAPSULE | Freq: Four times a day (QID) | ORAL | Status: DC | PRN

## 2017-07-06 MED ORDER — SOD CITRATE-CITRIC ACID 500-334 MG/5ML PO SOLN: 30.0000 mL | ORAL | Status: DC | PRN

## 2017-07-06 MED ORDER — LACTATED RINGERS IV SOLN: 500.0000 mL | INTRAVENOUS | Status: DC | PRN

## 2017-07-06 MED ORDER — PHENYLEPHRINE 40 MCG/ML (10ML) SYRINGE FOR IV PUSH (FOR BLOOD PRESSURE SUPPORT)
PREFILLED_SYRINGE | INTRAVENOUS | Status: AC
  Filled 2017-07-06: qty 20

## 2017-07-06 MED ORDER — SODIUM CHLORIDE 0.9 % IV SOLN
2.0000 g | Freq: Once | INTRAVENOUS | Status: AC
  Administered 2017-07-06: 2 g via INTRAVENOUS
  Filled 2017-07-06: qty 2000

## 2017-07-06 MED ORDER — OXYCODONE-ACETAMINOPHEN 5-325 MG PO TABS: 1.0000 | ORAL_TABLET | ORAL | Status: DC | PRN

## 2017-07-06 MED ORDER — FENTANYL 2.5 MCG/ML BUPIVACAINE 1/10 % EPIDURAL INFUSION (WH - ANES)
14.0000 mL/h | INTRAMUSCULAR | Status: DC | PRN
  Administered 2017-07-06: 14 mL/h via EPIDURAL

## 2017-07-06 MED ORDER — PHENYLEPHRINE 40 MCG/ML (10ML) SYRINGE FOR IV PUSH (FOR BLOOD PRESSURE SUPPORT)
80.0000 ug | PREFILLED_SYRINGE | INTRAVENOUS | Status: DC | PRN
Start: 2017-07-06 — End: 2017-07-06
  Filled 2017-07-06: qty 5

## 2017-07-06 MED ORDER — DIBUCAINE 1 % RE OINT: 1.0000 "application " | TOPICAL_OINTMENT | RECTAL | Status: DC | PRN

## 2017-07-06 MED ORDER — TETANUS-DIPHTH-ACELL PERTUSSIS 5-2.5-18.5 LF-MCG/0.5 IM SUSP: 0.5000 mL | Freq: Once | INTRAMUSCULAR | Status: DC

## 2017-07-06 MED ORDER — ONDANSETRON HCL 4 MG/2ML IJ SOLN: 4.0000 mg | Freq: Four times a day (QID) | INTRAMUSCULAR | Status: DC | PRN

## 2017-07-06 MED ORDER — COCONUT OIL OIL: 1.0000 "application " | TOPICAL_OIL | Status: DC | PRN

## 2017-07-06 MED ORDER — SIMETHICONE 80 MG PO CHEW: 80.0000 mg | CHEWABLE_TABLET | ORAL | Status: DC | PRN

## 2017-07-06 MED ORDER — OXYTOCIN 40 UNITS IN LACTATED RINGERS INFUSION - SIMPLE MED: 2.5000 [IU]/h | INTRAVENOUS | Status: DC

## 2017-07-06 MED ORDER — OXYTOCIN 40 UNITS IN LACTATED RINGERS INFUSION - SIMPLE MED
INTRAVENOUS | Status: AC
  Filled 2017-07-06: qty 1000

## 2017-07-06 MED ORDER — OXYCODONE-ACETAMINOPHEN 5-325 MG PO TABS: 2.0000 | ORAL_TABLET | ORAL | Status: DC | PRN

## 2017-07-06 MED ORDER — PRENATAL MULTIVITAMIN CH
1.0000 | ORAL_TABLET | Freq: Every day | ORAL | Status: DC
  Administered 2017-07-06 – 2017-07-08 (×3): 1 via ORAL
  Filled 2017-07-06 (×3): qty 1

## 2017-07-06 MED ORDER — LIDOCAINE HCL (PF) 1 % IJ SOLN
30.0000 mL | INTRAMUSCULAR | Status: DC | PRN
  Filled 2017-07-06: qty 30

## 2017-07-06 MED ORDER — PHENYLEPHRINE 40 MCG/ML (10ML) SYRINGE FOR IV PUSH (FOR BLOOD PRESSURE SUPPORT)
80.0000 ug | PREFILLED_SYRINGE | INTRAVENOUS | Status: DC | PRN
  Filled 2017-07-06: qty 5

## 2017-07-06 MED ORDER — ZOLPIDEM TARTRATE 5 MG PO TABS: 5.0000 mg | ORAL_TABLET | Freq: Every evening | ORAL | Status: DC | PRN

## 2017-07-06 MED ORDER — DIPHENHYDRAMINE HCL 50 MG/ML IJ SOLN: 12.5000 mg | INTRAMUSCULAR | Status: DC | PRN

## 2017-07-06 MED ORDER — IBUPROFEN 600 MG PO TABS
600.0000 mg | ORAL_TABLET | Freq: Four times a day (QID) | ORAL | Status: DC
  Administered 2017-07-06 – 2017-07-08 (×10): 600 mg via ORAL
  Filled 2017-07-06 (×10): qty 1

## 2017-07-06 MED ORDER — LIDOCAINE HCL (PF) 1 % IJ SOLN
INTRAMUSCULAR | Status: AC
  Filled 2017-07-06: qty 30

## 2017-07-06 NOTE — Anesthesia Procedure Notes (Signed)
Epidural Patient location during procedure: OB Start time: 07/06/2017 1:13 AM End time: 07/06/2017 1:22 AM  Staffing Anesthesiologist: Shelton SilvasHollis, Mercedez Boule D, MD Performed: anesthesiologist   Preanesthetic Checklist Completed: patient identified, site marked, surgical consent, pre-op evaluation, timeout performed, IV checked, risks and benefits discussed and monitors and equipment checked  Epidural Patient position: sitting Prep: ChloraPrep Patient monitoring: heart rate, continuous pulse ox and blood pressure Approach: midline Location: L3-L4 Injection technique: LOR saline  Needle:  Needle type: Tuohy  Needle gauge: 17 G Needle length: 9 cm Catheter type: closed end flexible Catheter size: 20 Guage Test dose: negative and 1.5% lidocaine  Assessment Events: blood not aspirated, injection not painful, no injection resistance and no paresthesia  Additional Notes LOR @ 5  Patient identified. Risks/Benefits/Options discussed with patient including but not limited to bleeding, infection, nerve damage, paralysis, failed block, incomplete pain control, headache, blood pressure changes, nausea, vomiting, reactions to medications, itching and postpartum back pain. Confirmed with bedside nurse the patient's most recent platelet count. Confirmed with patient that they are not currently taking any anticoagulation, have any bleeding history or any family history of bleeding disorders. Patient expressed understanding and wished to proceed. All questions were answered. Sterile technique was used throughout the entire procedure. Please see nursing notes for vital signs. Test dose was given through epidural catheter and negative prior to continuing to dose epidural or start infusion. Warning signs of high block given to the patient including shortness of breath, tingling/numbness in hands, complete motor block, or any concerning symptoms with instructions to call for help. Patient was given instructions  on fall risk and not to get out of bed. All questions and concerns addressed with instructions to call with any issues or inadequate analgesia.    Reason for block:procedure for pain

## 2017-07-06 NOTE — Anesthesia Postprocedure Evaluation (Signed)
Anesthesia Post Note  Patient: Carrie Mckenzie  Procedure(s) Performed: AN AD HOC LABOR EPIDURAL     Patient location during evaluation: Mother Baby Anesthesia Type: Epidural Level of consciousness: awake Pain management: pain level controlled Vital Signs Assessment: post-procedure vital signs reviewed and stable Respiratory status: spontaneous breathing Cardiovascular status: stable Postop Assessment: no headache, no backache, epidural receding, patient able to bend at knees, no apparent nausea or vomiting and adequate PO intake Anesthetic complications: no    Last Vitals:  Vitals:   07/06/17 0310 07/06/17 0413  BP: 127/72 (!) 105/56  Pulse: 65 71  Resp: 18 18  Temp: 36.8 C 37 C    Last Pain:  Vitals:   07/06/17 0554  TempSrc:   PainSc: 0-No pain   Pain Goal:                 Tiffannie Sloss

## 2017-07-06 NOTE — MAU Note (Signed)
Pt presents to MAU c/o ctxs that have been occurring all day however worsened and became more consistent around 2100. No bleeding leaking. Good FM.

## 2017-07-06 NOTE — H&P (Signed)
LABOR AND DELIVERY ADMISSION HISTORY AND PHYSICAL NOTE  Carrie Sievertierra Riden is a 26 y.o. female [phone] with IUP at 468w4d by LMP presenting for SOL. Patient reports contractions all day worsening around 2100. Uncomplicated pregnancy. Patient on iron supplementation. Patient has a history of postpartum hypertension. Patient also has a history of left simple ovarian cyst that has been stable through her pregnancy.  She reports positive fetal movement. She denies leakage of fluid or vaginal bleeding.  Prenatal History/Complications: --Patient with a history of postpartum HTN  Past Medical History: Past Medical History:  Diagnosis Date  . Anxiety   . BV (bacterial vaginosis) 12/17/2013  . Ovarian cyst   . Vaginal discharge 12/17/2013    Past Surgical History: Past Surgical History:  Procedure Laterality Date  . CHOLECYSTECTOMY    . WISDOM TOOTH EXTRACTION      Obstetrical History: OB History    Gravida Para Term Preterm AB Living   4 2 2  0 1 2   SAB TAB Ectopic Multiple Live Births   1 0 0 0 2      Social History: Social History   Socioeconomic History  . Marital status: Single    Spouse name: Not on file  . Number of children: Not on file  . Years of education: Not on file  . Highest education level: Not on file  Social Needs  . Financial resource strain: Not on file  . Food insecurity - worry: Not on file  . Food insecurity - inability: Not on file  . Transportation needs - medical: Not on file  . Transportation needs - non-medical: Not on file  Occupational History  . Not on file  Tobacco Use  . Smoking status: Current Every Day Smoker    Packs/day: 0.50    Years: 10.00    Pack years: 5.00    Types: Cigarettes  . Smokeless tobacco: Never Used  Substance and Sexual Activity  . Alcohol use: No    Alcohol/week: 0.0 oz    Comment: occassionally  . Drug use: No  . Sexual activity: Yes    Birth control/protection: Condom  Other Topics Concern  . Not on file   Social History Narrative  . Not on file    Family History: Family History  Problem Relation Age of Onset  . Diabetes Mother   . Thyroid disease Mother        had thyroid removed  . Hypertension Mother   . Hypertension Father   . Cancer Father        prostate  . Depression Father   . Anxiety disorder Father   . Cirrhosis Father   . COPD Father   . Kidney disease Father   . Diabetes Maternal Aunt   . Diabetes Maternal Uncle   . Cancer Maternal Grandmother        ovarian, uterine  . Diabetes Maternal Aunt   . Diabetes Maternal Aunt   . Diabetes Maternal Uncle   . Diabetes Maternal Uncle   . Diabetes Maternal Uncle   . Diabetes Maternal Uncle   . Diabetes Maternal Uncle   . Diabetes Maternal Uncle   . ADD / ADHD Daughter   . COPD Maternal Grandfather   . Emphysema Maternal Grandfather   . Asthma Paternal Aunt     Allergies: No Known Allergies  Medications Prior to Admission  Medication Sig Dispense Refill Last Dose  . ferrous sulfate 325 (65 FE) MG tablet Take 1 tablet (325 mg total) by mouth 2 (two)  times daily with a meal. 60 tablet 3 Taking     Review of Systems  All systems reviewed and negative except as stated in HPI  Physical Exam Blood pressure (!) 122/46, pulse 89, temperature 97.8 F (36.6 C), temperature source Oral, resp. rate 18, height 5\' 5"  (1.651 m), weight 154 lb (69.9 kg), last menstrual period 10/09/2016. General appearance: alert, cooperative and mild distress Lungs: clear to auscultation bilaterally Heart: regular rate and rhythm Abdomen: soft, non-tender; bowel sounds normal Extremities: No calf swelling or tenderness Presentation: cephalic Fetal monitoring: HR 409140, moderate variability, + accel, no decel Uterine activity: q2-4 min Dilation: 8 Effacement (%): 90 Station: -2 Exam by:: lauren fields rn    Prenatal labs: ABO, Rh: A/Positive/-- (05/10 1042) Antibody: Negative (09/21 0859) Rubella: 1.33 (05/10 1042) RPR: Non  Reactive (09/21 0859)  HBsAg: Negative (05/10 1042)  HIV:  Non reactive GBS: Positive (11/15 0000)  1 hr Glucola: Normal Genetic screening:  Negative Anatomy US: Normal   Prenatal Transfer Tool  Maternal Diabetes: No Genetic Screening: Normal Maternal Ultrasounds/Referrals: Normal Fetal Ultrasounds or other Referrals:  None Maternal Substance Abuse:  No Significant Maternal Medications:  Meds include: Other: Iron supplementation Significant Maternal Lab Results: Lab values include: Group B Strep positive  No results found for this or any previous visit (from the past 24 hour(s)).  Patient Active Problem List   Diagnosis Date Noted  . Supervision of normal pregnancy 12/16/2016  . Left ovarian cyst 12/16/2016  . Smoker 12/16/2016  . History of postpartum hypertension 05/13/2014  . BV (bacterial vaginosis) 12/17/2013  . Rubella non-immune status, antepartum 08/28/2013  . Anxiety 08/27/2013    Assessment: Carrie Mckenzie is a 26 y.o. G4P2012 at 1477w4d here for SOL.   #Labor: expectant mangement #Pain: IV pain meds, epidural per patient #FWB: Cat 1 #ID:  GBS pos #MOF: Breast and Bottle #MOC: IUD #Circ:  N/A  Lovena NeighboursAbdoulaye Diallo, MD 07/06/2017, 12:35 AM   OB FELLOW HISTORY AND PHYSICAL ATTESTATION I have seen and examined this patient; I agree with above documentation in the resident's note.   Pt admitted in active labor. Ampicillin for GBS positive status  Frederik PearJulie P Lydiana Milley, MD OB Fellow 07/06/2017, 2:13 AM

## 2017-07-06 NOTE — Progress Notes (Signed)

## 2017-07-06 NOTE — Anesthesia Preprocedure Evaluation (Signed)
Anesthesia Evaluation  Patient identified by MRN, date of birth, ID band Patient awake    Reviewed: Allergy & Precautions, Patient's Chart, lab work & pertinent test results  Airway Mallampati: I       Dental  (+) Teeth Intact   Pulmonary neg pulmonary ROS, Current Smoker,    breath sounds clear to auscultation       Cardiovascular negative cardio ROS Normal cardiovascular exam     Neuro/Psych negative neurological ROS     GI/Hepatic negative GI ROS, Neg liver ROS,   Endo/Other  negative endocrine ROS  Renal/GU negative Renal ROS     Musculoskeletal negative musculoskeletal ROS (+)   Abdominal   Peds  Hematology negative hematology ROS (+)   Anesthesia Other Findings Day of surgery medications reviewed with the patient.  Reproductive/Obstetrics (+) Pregnancy                             Anesthesia Physical Anesthesia Plan  ASA: II  Anesthesia Plan: Epidural   Post-op Pain Management:    Induction:   PONV Risk Score and Plan:   Airway Management Planned:   Additional Equipment:   Intra-op Plan:   Post-operative Plan:   Informed Consent: I have reviewed the patients History and Physical, chart, labs and discussed the procedure including the risks, benefits and alternatives for the proposed anesthesia with the patient or authorized representative who has indicated his/her understanding and acceptance.     Plan Discussed with:   Anesthesia Plan Comments:         Anesthesia Quick Evaluation

## 2017-07-06 NOTE — Plan of Care (Signed)
Progressing appropriately.

## 2017-07-07 ENCOUNTER — Encounter: Payer: Medicaid Other | Admitting: Women's Health

## 2017-07-07 ENCOUNTER — Encounter (HOSPITAL_COMMUNITY): Payer: Self-pay | Admitting: *Deleted

## 2017-07-07 ENCOUNTER — Other Ambulatory Visit: Payer: Self-pay

## 2017-07-07 NOTE — Plan of Care (Signed)
Patient is progressing well and should be able to d/c at scheduled time. Patient is experiencing no pain

## 2017-07-07 NOTE — Progress Notes (Signed)
POSTPARTUM PROGRESS NOTE  Post Partum Day 1 Subjective:  Carrie Mckenzie is a 26 y.o. [id number] 5275w4d s/p SVD.  No acute events overnight.  Pt denies problems with ambulating, voiding or po intake.  She denies nausea or vomiting.  Pain is well controlled.  She has had flatus. She has not had bowel movement, started senokot last night.  Lochia Small.   Objective: Blood pressure (!) 120/48, pulse 60, temperature 98.1 F (36.7 C), temperature source Oral, resp. rate 16, height 5\' 5"  (1.651 m), weight 69.9 kg (154 lb), last menstrual period 10/09/2016, SpO2 100 %, unknown if currently breastfeeding.  Physical Exam:  General: alert, cooperative and no distress Lochia:normal flow Chest: no respiratory distress, clear to auscultation Heart:regular rate, distal pulses intact Abdomen: soft, nontender, normoactive bowel sounds Uterine Fundus: firm, below level of umbilicus DVT Evaluation: No calf swelling or tenderness Extremities: No edema  Recent Labs    07/06/17 0043  HGB 9.9*  HCT 31.4*    Assessment/Plan:  ASSESSMENT: Carrie Sievertierra Dalziel is a 26 y.o. [id number] 6975w4d s/p SVD who delivered prior to completing penicillin for GBS positive.   Plan for discharge tomorrow and Contraception IUD. Patient is bottle feeding.    LOS: 1 day   Yarethzi Branan SeivleyMD 07/07/2017, 7:30 AM

## 2017-07-08 NOTE — Discharge Instructions (Signed)

## 2017-07-08 NOTE — Discharge Summary (Signed)
OB Discharge Summary     Patient Name: Carrie Mckenzie DOB: 10/08/1990 MRN: [id number]  Date of admission: 07/06/2017 Delivering MD: Frederik PearEGELE, JULIE P   Date of discharge: 07/08/2017  Admitting diagnosis: 38 WEEKS CTX Intrauterine pregnancy: 2336w4d     Secondary diagnosis:  Principal Problem:   SVD (spontaneous vaginal delivery)  Additional problems: None     Discharge diagnosis: Term Pregnancy Delivered                                                                                                Post partum procedures: None  Augmentation: N/A  Complications: None  Hospital course:  Onset of Labor With Vaginal Delivery     26 y.o. yo [id number] at 6436w4d was admitted in Active Labor on 07/06/2017. Patient had an uncomplicated labor course as follows:  Membrane Rupture Time/Date: 1:18 AM ,07/06/2017   Intrapartum Procedures: Episiotomy: None [1]                                         Lacerations:  None [1]  Patient had a delivery of a Viable infant. 07/06/2017  Information for the patient's newborn:  Cathlean SauerHenderson, Girl Floraine [[id number]][[id number]]  Delivery Method: Vaginal, Spontaneous(Filed from Delivery Summary)    Pateint had an uncomplicated postpartum course.  She is ambulating, tolerating a regular diet, passing flatus, and urinating well. Patient is discharged home in stable condition on 07/08/17.   Physical exam  Vitals:   07/06/17 1813 07/07/17 0628 07/07/17 1727 07/08/17 0514  BP: (!) 122/56 (!) 120/48 (!) 135/57 (!) 113/44  Pulse: 75 60 74 69  Resp: 18 16 17 18   Temp: 98.5 F (36.9 C) 98.1 F (36.7 C) 97.7 F (36.5 C) 97.9 F (36.6 C)  TempSrc: Oral Oral Oral Oral  SpO2:      Weight:      Height:       General: alert, cooperative and no distress Lochia: appropriate Uterine Fundus: firm Incision: Healing well with no significant drainage DVT Evaluation: No evidence of DVT seen on physical exam. Labs: Lab Results  Component Value Date   WBC 11.5 (H) 07/06/2017    HGB 9.9 (L) 07/06/2017   HCT 31.4 (L) 07/06/2017   MCV 79.7 07/06/2017   PLT 289 07/06/2017   CMP Latest Ref Rng & Units 04/30/2017  Glucose 65 - 99 mg/dL 756(E103(H)  BUN 6 - 20 mg/dL 7  Creatinine 3.320.44 - 9.511.00 mg/dL 8.840.51  Sodium 166135 - 063145 mmol/L 135  Potassium 3.5 - 5.1 mmol/L 3.4(L)  Chloride 101 - 111 mmol/L 105  CO2 22 - 32 mmol/L 21(L)  Calcium 8.9 - 10.3 mg/dL 0.1(S8.5(L)  Total Protein 6.0 - 8.3 g/dL -  Total Bilirubin 0.2 - 1.2 mg/dL -  Alkaline Phos 39 - 010117 U/L -  AST 0 - 37 U/L -  ALT 0 - 35 U/L -    Discharge instruction: per After Visit Summary and "Baby and Me Booklet".  After visit meds:  Allergies as of 07/08/2017  No Known Allergies     Medication List    TAKE these medications   ferrous sulfate 325 (65 FE) MG tablet Take 1 tablet (325 mg total) by mouth 2 (two) times daily with a meal.       Diet: routine diet  Activity: Advance as tolerated. Pelvic rest for 6 weeks.   Outpatient follow up:4 weeks Follow up Appt:No future appointments. Follow up Visit:No Follow-up on file.  Postpartum contraception: IUD unclear   Newborn Data: Live born female  Birth Weight: 5 lb 12.4 oz (2619 g) APGAR: 9, 9  Newborn Delivery   Birth date/time:  07/06/2017 01:26:00 Delivery type:  Vaginal, Spontaneous     Baby Feeding: Bottle and Breast Disposition:home with mother   07/08/2017 Lovena NeighboursAbdoulaye Diallo, MD   I confirm that I have verified the information documented in the student's note and that I have also personally reperformed the physical exam and all medical decision making activities.   Luna KitchensKAthryn Kooistra CNM

## 2017-07-11 ENCOUNTER — Encounter: Payer: Self-pay | Admitting: *Deleted

## 2017-08-18 ENCOUNTER — Ambulatory Visit (INDEPENDENT_AMBULATORY_CARE_PROVIDER_SITE_OTHER): Payer: Medicaid Other | Admitting: Advanced Practice Midwife

## 2017-08-18 ENCOUNTER — Other Ambulatory Visit: Payer: Self-pay

## 2017-08-18 ENCOUNTER — Encounter: Payer: Self-pay | Admitting: Advanced Practice Midwife

## 2017-08-18 DIAGNOSIS — R030 Elevated blood-pressure reading, without diagnosis of hypertension: Secondary | ICD-10-CM

## 2017-08-18 DIAGNOSIS — Z3202 Encounter for pregnancy test, result negative: Secondary | ICD-10-CM | POA: Diagnosis not present

## 2017-08-18 LAB — POCT URINE PREGNANCY: Preg Test, Ur: NEGATIVE

## 2017-08-18 MED ORDER — NORETHINDRONE 0.35 MG PO TABS: 1.0000 | ORAL_TABLET | Freq: Every day | ORAL | 11 refills | Status: DC

## 2017-08-18 MED ORDER — AMLODIPINE BESYLATE 5 MG PO TABS: 5.0000 mg | ORAL_TABLET | Freq: Every day | ORAL | 2 refills | Status: DC

## 2017-08-18 NOTE — Progress Notes (Signed)
Carrie Mckenzie is a 27 y.o. who presents for a postpartum visit. She is 6 weeks postpartum following a spontaneous vaginal delivery. I have fully reviewed the prenatal and intrapartum course. The delivery was at 38.4 gestational weeks.  Anesthesia: epidural.(had baby after  Postpartum course has been uneventufl. No BP issues while in hospital or immediate pp. Baby's course has been uneventful. Baby is feeding by breast for 2.5 weeks now bottle. Bleeding: no bleeding and had a period that started last week. Bowel function is normal. Bladder function is normal. Patient is not sexually active. Contraception method is none. Postpartum depression screening: negative.   Current Outpatient Medications:  .  amLODipine (NORVASC) 5 MG tablet, Take 1 tablet (5 mg total) by mouth daily., Disp: 30 tablet, Rfl: 2 .  ferrous sulfate 325 (65 FE) MG tablet, Take 1 tablet (325 mg total) by mouth 2 (two) times daily with a meal. (Patient not taking: Reported on 08/18/2017), Disp: 60 tablet, Rfl: 3 .  norethindrone (ORTHO MICRONOR) 0.35 MG tablet, Take 1 tablet (0.35 mg total) by mouth daily., Disp: 1 Package, Rfl: 11  Review of Systems   Constitutional: Negative for fever and chills Eyes: Negative for visual disturbances Respiratory: Negative for shortness of breath, dyspnea Cardiovascular: Negative for chest pain or palpitations  Gastrointestinal: Negative for vomiting, diarrhea and constipation Genitourinary: Negative for dysuria and urgency Musculoskeletal: Negative for back pain, joint pain, myalgias  Neurological: Negative for dizziness and headaches    Objective:     Vitals:   08/18/17 1504 08/18/17 1523  BP: (!) 158/86 (!) 150/90  Pulse: 86    General:  alert, cooperative and no distress   Breasts:  negative  Lungs: clear to auscultation bilaterally  Heart:  regular rate and rhythm  Abdomen: Soft, nontender   Vulva:  normal  Vagina: normal vagina  Cervix:  closed  Corpus: Well involuted      Rectal Exam: no hemorrhoids        150/90  Assessment:    normal postpartum exam. PP HTN  Plan:   1. Contraception: Micronor to start the change to seasonique when BP normalized.    Norvasc 5 mg (can check BP at home;  Will trial off meds the first of each month until it drops ,140/90 and meds are no longer needed)  2. Follow up in: 1 week for BP check.

## 2017-08-26 ENCOUNTER — Encounter: Payer: Self-pay | Admitting: *Deleted

## 2017-08-26 ENCOUNTER — Ambulatory Visit: Payer: Medicaid Other | Admitting: *Deleted

## 2017-08-26 VITALS — BP 132/80 | HR 94 | Ht 65.0 in | Wt 141.0 lb

## 2017-08-26 DIAGNOSIS — Z8759 Personal history of other complications of pregnancy, childbirth and the puerperium: Principal | ICD-10-CM

## 2017-08-26 DIAGNOSIS — Z8679 Personal history of other diseases of the circulatory system: Secondary | ICD-10-CM

## 2017-09-29 ENCOUNTER — Encounter: Payer: Self-pay | Admitting: Advanced Practice Midwife

## 2017-09-29 ENCOUNTER — Telehealth: Payer: Self-pay | Admitting: *Deleted

## 2017-09-29 MED ORDER — LEVONORGEST-ETH ESTRAD 91-DAY 0.15-0.03 &0.01 MG PO TABS: 1.0000 | ORAL_TABLET | Freq: Every day | ORAL | 4 refills | Status: DC

## 2017-09-29 NOTE — Telephone Encounter (Signed)
Patient states her BP's have 120's/80's for about 3 weeks now. Requesting to be switched back to [facility]. Please advise.

## 2018-01-20 ENCOUNTER — Telehealth: Payer: Self-pay | Admitting: Advanced Practice Midwife

## 2018-01-20 NOTE — Telephone Encounter (Signed)
LMOVM that Drenda FreezeFran was out of the office until Tuesday but she could call back then if desired.

## 2018-07-12 ENCOUNTER — Ambulatory Visit (INDEPENDENT_AMBULATORY_CARE_PROVIDER_SITE_OTHER): Payer: Medicaid Other | Admitting: Advanced Practice Midwife

## 2018-07-12 ENCOUNTER — Other Ambulatory Visit (HOSPITAL_COMMUNITY)
Admission: RE | Admit: 2018-07-12 | Discharge: 2018-07-12 | Disposition: A | Discharge status: Home / Self Care | Payer: Medicaid Other | Source: Ambulatory Visit | Attending: Advanced Practice Midwife | Admitting: Advanced Practice Midwife

## 2018-07-12 ENCOUNTER — Encounter: Payer: Self-pay | Admitting: Advanced Practice Midwife

## 2018-07-12 VITALS — BP 144/75 | HR 72 | Ht 65.0 in | Wt 144.0 lb

## 2018-07-12 DIAGNOSIS — Z01419 Encounter for gynecological examination (general) (routine) without abnormal findings: Secondary | ICD-10-CM | POA: Insufficient documentation

## 2018-07-12 DIAGNOSIS — Z309 Encounter for contraceptive management, unspecified: Secondary | ICD-10-CM

## 2018-07-12 DIAGNOSIS — N83202 Unspecified ovarian cyst, left side: Secondary | ICD-10-CM

## 2018-07-12 NOTE — Progress Notes (Signed)
Carrie Mckenzie 27 y.o.  Vitals:   07/12/18 1347  BP: (!) 144/75  Pulse: 72     Filed Weights   07/12/18 1347  Weight: 144 lb (65.3 kg)    Past Medical History: Past Medical History:  Diagnosis Date  . Anxiety   . BV (bacterial vaginosis) 12/17/2013  . Ovarian cyst   . Vaginal discharge 12/17/2013    Past Surgical History: Past Surgical History:  Procedure Laterality Date  . CHOLECYSTECTOMY    . WISDOM TOOTH EXTRACTION      Family History: Family History  Problem Relation Age of Onset  . Diabetes Mother   . Thyroid disease Mother        had thyroid removed  . Hypertension Mother   . Hypertension Father   . Cancer Father        prostate  . Depression Father   . Anxiety disorder Father   . Cirrhosis Father   . COPD Father   . Kidney disease Father   . Diabetes Maternal Aunt   . Diabetes Maternal Uncle   . Cancer Maternal Grandmother        ovarian, uterine  . Diabetes Maternal Aunt   . Diabetes Maternal Aunt   . Diabetes Maternal Uncle   . Diabetes Maternal Uncle   . Diabetes Maternal Uncle   . Diabetes Maternal Uncle   . Diabetes Maternal Uncle   . Diabetes Maternal Uncle   . ADD / ADHD Daughter   . COPD Maternal Grandfather   . Emphysema Maternal Grandfather   . Asthma Paternal Aunt     Social History: Social History   Tobacco Use  . Smoking status: Current Every Day Smoker    Packs/day: 0.50    Years: 10.00    Pack years: 5.00    Types: Cigarettes  . Smokeless tobacco: Never Used  Substance Use Topics  . Alcohol use: No    Alcohol/week: 0.0 standard drinks    Comment: occassionally  . Drug use: No    Allergies: No Known Allergies    Current Outpatient Medications:  .  Levonorgestrel-Ethinyl Estradiol (AMETHIA,CAMRESE) 0.15-0.03 &0.01 MG tablet, Take 1 tablet by mouth daily., Disp: 1 Package, Rfl: 4  History of Present Illness: Here for pap and physicl  Last pap 2/18,wLGSIL.  Had ~ 11cm L ovarian cyst w/pg last year, hasn't  followed up .No really reason, really [facility] about [facility]. No extra bleeding.     Review of Systems   Patient denies any headaches, blurred vision, shortness of breath, chest pain, abdominal pain, problems with bowel movements, urination, or intercourse.   Physical Exam: General:  Well developed, well nourished, no acute distress Skin:  Warm and dry Neck:  Midline trachea, normal thyroid Lungs; Clear to auscultation bilaterally Breast:  No dominant palpable mass, retraction, or nipple discharge Cardiovascular: Regular rate and rhythm Abdomen:  Soft, non tender, no hepatosplenomegaly Pelvic:  External genitalia is normal in appearance.  The vagina is normal in appearance.  The cervix is bulbous.  Uterus is felt to be normal size, shape, and contour.  No adnexal masses or tenderness noted. No cyst appreciated Extremities:  No swelling or varicosities noted Psych:  No mood changes.     Impression: normal gyn exam Hx LSIL last year w/o f/u (pt "just didn't come) Hx 11cm simple ov cyst     Plan: If pap normal, get a few more yearly normals before going to 3 year schedulel  Pt prefers that F/U  US to recheck cyst

## 2018-07-17 LAB — CYTOLOGY - PAP
Chlamydia: NEGATIVE
DIAGNOSIS: NEGATIVE
NEISSERIA GONORRHEA: NEGATIVE

## 2018-09-07 ENCOUNTER — Ambulatory Visit: Payer: Medicaid Other | Admitting: Obstetrics & Gynecology

## 2018-09-07 ENCOUNTER — Other Ambulatory Visit: Payer: Medicaid Other

## 2018-09-14 ENCOUNTER — Other Ambulatory Visit: Payer: Self-pay | Admitting: Advanced Practice Midwife

## 2018-09-14 DIAGNOSIS — Z01419 Encounter for gynecological examination (general) (routine) without abnormal findings: Secondary | ICD-10-CM

## 2018-09-14 DIAGNOSIS — N83202 Unspecified ovarian cyst, left side: Secondary | ICD-10-CM

## 2018-09-15 ENCOUNTER — Other Ambulatory Visit: Payer: Medicaid Other

## 2018-09-15 ENCOUNTER — Ambulatory Visit: Payer: Medicaid Other | Admitting: Obstetrics & Gynecology

## 2018-09-15 ENCOUNTER — Encounter: Payer: Self-pay | Admitting: Obstetrics & Gynecology

## 2019-03-05 ENCOUNTER — Other Ambulatory Visit: Payer: Self-pay | Admitting: Advanced Practice Midwife

## 2019-08-14 ENCOUNTER — Other Ambulatory Visit: Payer: Self-pay

## 2019-08-14 ENCOUNTER — Ambulatory Visit (INDEPENDENT_AMBULATORY_CARE_PROVIDER_SITE_OTHER): Payer: Medicaid Other | Admitting: Obstetrics & Gynecology

## 2019-08-14 ENCOUNTER — Other Ambulatory Visit (HOSPITAL_COMMUNITY)
Admission: RE | Admit: 2019-08-14 | Discharge: 2019-08-14 | Disposition: A | Discharge status: Home / Self Care | Payer: Medicaid Other | Source: Ambulatory Visit | Attending: Obstetrics & Gynecology | Admitting: Obstetrics & Gynecology

## 2019-08-14 ENCOUNTER — Encounter: Payer: Self-pay | Admitting: Obstetrics & Gynecology

## 2019-08-14 VITALS — BP 147/77 | HR 74 | Ht 65.0 in | Wt 137.0 lb

## 2019-08-14 DIAGNOSIS — Z0000 Encounter for general adult medical examination without abnormal findings: Secondary | ICD-10-CM

## 2019-08-14 DIAGNOSIS — Z113 Encounter for screening for infections with a predominantly sexual mode of transmission: Secondary | ICD-10-CM

## 2019-08-14 DIAGNOSIS — Z01419 Encounter for gynecological examination (general) (routine) without abnormal findings: Secondary | ICD-10-CM | POA: Diagnosis present

## 2019-08-14 NOTE — Progress Notes (Signed)
Subjective:     Carrie Mckenzie is a 29 y.o. female here for a routine exam.  Patient's last menstrual period was 07/26/2019. [id number] Birth Control Method:  90 day pill Menstrual Calendar(currently): regular every 90 days  Current complaints: none.   Current acute medical issues:  none   Recent Gynecologic History Patient's last menstrual period was 07/26/2019. Last Pap: 2019,  normal Last mammogram:   Past Medical History:  Diagnosis Date  . Anxiety   . BV (bacterial vaginosis) 12/17/2013  . Ovarian cyst   . Vaginal discharge 12/17/2013    Past Surgical History:  Procedure Laterality Date  . CHOLECYSTECTOMY    . WISDOM TOOTH EXTRACTION      OB History    Gravida  4   Para  3   Term  3   Preterm  0   AB  1   Living  3     SAB  1   TAB  0   Ectopic  0   Multiple  0   Live Births  3           Social History   Socioeconomic History  . Marital status: Single    Spouse name: Not on file  . Number of children: Not on file  . Years of education: Not on file  . Highest education level: Not on file  Occupational History  . Not on file  Tobacco Use  . Smoking status: Current Every Day Smoker    Packs/day: 0.50    Years: 10.00    Pack years: 5.00    Types: Cigarettes  . Smokeless tobacco: Never Used  Substance and Sexual Activity  . Alcohol use: No    Alcohol/week: 0.0 standard drinks    Comment: occassionally  . Drug use: No  . Sexual activity: Not Currently    Birth control/protection: Condom  Other Topics Concern  . Not on file  Social History Narrative  . Not on file   Social Determinants of Health   Financial Resource Strain:   . Difficulty of Paying Living Expenses: Not on file  Food Insecurity:   . Worried About Programme researcher, broadcasting/film/video in the Last Year: Not on file  . Ran Out of Food in the Last Year: Not on file  Transportation Needs:   . Lack of Transportation (Medical): Not on file  . Lack of Transportation (Non-Medical): Not on  file  Physical Activity:   . Days of Exercise per Week: Not on file  . Minutes of Exercise per Session: Not on file  Stress:   . Feeling of Stress : Not on file  Social Connections:   . Frequency of Communication with Friends and Family: Not on file  . Frequency of Social Gatherings with Friends and Family: Not on file  . Attends Religious Services: Not on file  . Active Member of Clubs or Organizations: Not on file  . Attends Banker Meetings: Not on file  . Marital Status: Not on file    Family History  Problem Relation Age of Onset  . Diabetes Mother   . Thyroid disease Mother        had thyroid removed  . Hypertension Mother   . Hypertension Father   . Cancer Father        prostate  . Depression Father   . Anxiety disorder Father   . Cirrhosis Father   . COPD Father   . Kidney disease Father   . Diabetes Maternal  Aunt   . Diabetes Maternal Uncle   . Cancer Maternal Grandmother        ovarian, uterine  . Diabetes Maternal Aunt   . Diabetes Maternal Aunt   . Diabetes Maternal Uncle   . Diabetes Maternal Uncle   . Diabetes Maternal Uncle   . Diabetes Maternal Uncle   . Diabetes Maternal Uncle   . Diabetes Maternal Uncle   . ADD / ADHD Daughter   . COPD Maternal Grandfather   . Emphysema Maternal Grandfather   . Asthma Paternal Aunt      Current Outpatient Medications:  .  DAYSEE 0.15-0.03 &0.01 MG tablet, TAKE 1 TABLET BY MOUTH ONCE DAILY., Disp: 1 Package, Rfl: 11  Review of Systems  Review of Systems  Constitutional: Negative for fever, chills, weight loss, malaise/fatigue and diaphoresis.  HENT: Negative for hearing loss, ear pain, nosebleeds, congestion, sore throat, neck pain, tinnitus and ear discharge.   Eyes: Negative for blurred vision, double vision, photophobia, pain, discharge and redness.  Respiratory: Negative for cough, hemoptysis, sputum production, shortness of breath, wheezing and stridor.   Cardiovascular: Negative for chest  pain, palpitations, orthopnea, claudication, leg swelling and PND.  Gastrointestinal: negative for abdominal pain. Negative for heartburn, nausea, vomiting, diarrhea, constipation, blood in stool and melena.  Genitourinary: Negative for dysuria, urgency, frequency, hematuria and flank pain.  Musculoskeletal: Negative for myalgias, back pain, joint pain and falls.  Skin: Negative for itching and rash.  Neurological: Negative for dizziness, tingling, tremors, sensory change, speech change, focal weakness, seizures, loss of consciousness, weakness and headaches.  Endo/Heme/Allergies: Negative for environmental allergies and polydipsia. Does not bruise/bleed easily.  Psychiatric/Behavioral: Negative for depression, suicidal ideas, hallucinations, memory loss and substance abuse. The patient is not nervous/anxious and does not have insomnia.        Objective:  Blood pressure (!) 147/77, pulse 74, height 5\' 5"  (1.651 m), weight 137 lb (62.1 kg), last menstrual period 07/26/2019.   Physical Exam  Vitals reviewed. Constitutional: She is oriented to person, place, and time. She appears well-developed and well-nourished.  HENT:  Head: Normocephalic and atraumatic.        Right Ear: External ear normal.  Left Ear: External ear normal.  Nose: Nose normal.  Mouth/Throat: Oropharynx is clear and moist.  Eyes: Conjunctivae and EOM are normal. Pupils are equal, round, and reactive to light. Right eye exhibits no discharge. Left eye exhibits no discharge. No scleral icterus.  Neck: Normal range of motion. Neck supple. No tracheal deviation present. No thyromegaly present.  Cardiovascular: Normal rate, regular rhythm, normal heart sounds and intact distal pulses.  Exam reveals no gallop and no friction rub.   No murmur heard. Respiratory: Effort normal and breath sounds normal. No respiratory distress. She has no wheezes. She has no rales. She exhibits no tenderness.  GI: Soft. Bowel sounds are normal. She  exhibits no distension and no mass. There is no tenderness. There is no rebound and no guarding.  Genitourinary:  Breasts no masses skin changes or nipple changes bilaterally      Vulva is normal without lesions Vagina is pink moist without discharge Cervix normal in appearance and pap is done Uterus is normal size shape and contour Adnexa is negative with normal sized ovaries   Musculoskeletal: Normal range of motion. She exhibits no edema and no tenderness.  Neurological: She is alert and oriented to person, place, and time. She has normal reflexes. She displays normal reflexes. No cranial nerve deficit. She exhibits normal muscle  tone. Coordination normal.  Skin: Skin is warm and dry. No rash noted. No erythema. No pallor.  Psychiatric: She has a normal mood and affect. Her behavior is normal. Judgment and thought content normal.       Medications Ordered at today's visit: No orders of the defined types were placed in this encounter.   Other orders placed at today's visit: Orders Placed This Encounter  Procedures  . HIV Antibody ( Reflex)  . RPR      Assessment:    Healthy female exam.    Plan:    Contraception: OCP (estrogen/progesterone). Follow up in: 1 years.     No follow-ups on file.

## 2019-08-15 LAB — RPR: RPR Ser Ql: NONREACTIVE

## 2019-08-15 LAB — HIV ANTIBODY (ROUTINE TESTING W REFLEX): HIV Screen 4th Generation wRfx: NONREACTIVE

## 2019-08-20 LAB — CYTOLOGY - PAP
Chlamydia: NEGATIVE
Comment: NEGATIVE
Comment: NEGATIVE
Comment: NEGATIVE
Comment: NEGATIVE
Comment: NORMAL
Diagnosis: UNDETERMINED — AB
HPV 16: POSITIVE — AB
HPV 18 / 45: NEGATIVE
High risk HPV: POSITIVE — AB
Neisseria Gonorrhea: NEGATIVE

## 2019-08-28 ENCOUNTER — Telehealth: Payer: Medicaid Other | Admitting: Physician Assistant

## 2019-08-28 DIAGNOSIS — N898 Other specified noninflammatory disorders of vagina: Secondary | ICD-10-CM | POA: Diagnosis not present

## 2019-08-28 MED ORDER — METRONIDAZOLE 500 MG PO TABS: 500.0000 mg | ORAL_TABLET | Freq: Two times a day (BID) | ORAL | 0 refills | Status: AC

## 2019-08-28 NOTE — Progress Notes (Signed)

## 2019-09-21 ENCOUNTER — Encounter: Payer: Medicaid Other | Admitting: Obstetrics & Gynecology

## 2019-09-28 ENCOUNTER — Encounter: Payer: Medicaid Other | Admitting: Obstetrics & Gynecology

## 2019-10-05 ENCOUNTER — Ambulatory Visit: Payer: Medicaid Other | Admitting: Obstetrics and Gynecology

## 2019-10-05 ENCOUNTER — Encounter: Payer: Self-pay | Admitting: Obstetrics and Gynecology

## 2019-10-05 ENCOUNTER — Other Ambulatory Visit: Payer: Self-pay

## 2019-10-05 ENCOUNTER — Other Ambulatory Visit (HOSPITAL_COMMUNITY)
Admission: RE | Admit: 2019-10-05 | Discharge: 2019-10-05 | Disposition: A | Discharge status: Home / Self Care | Payer: Medicaid Other | Source: Ambulatory Visit | Attending: Obstetrics and Gynecology | Admitting: Obstetrics and Gynecology

## 2019-10-05 VITALS — BP 149/86 | HR 70 | Ht 66.0 in | Wt 142.4 lb

## 2019-10-05 DIAGNOSIS — N760 Acute vaginitis: Secondary | ICD-10-CM | POA: Diagnosis not present

## 2019-10-05 DIAGNOSIS — B9689 Other specified bacterial agents as the cause of diseases classified elsewhere: Secondary | ICD-10-CM

## 2019-10-05 DIAGNOSIS — N898 Other specified noninflammatory disorders of vagina: Secondary | ICD-10-CM | POA: Insufficient documentation

## 2019-10-05 LAB — POCT WET PREP WITH KOH
Clue Cells Wet Prep HPF POC: POSITIVE
KOH Prep POC: NEGATIVE
Trichomonas, UA: NEGATIVE
Yeast Wet Prep HPF POC: NEGATIVE

## 2019-10-05 MED ORDER — METRONIDAZOLE 0.75 % VA GEL: 1.0000 | Freq: Every day | VAGINAL | 3 refills | Status: DC

## 2019-10-05 NOTE — Progress Notes (Signed)
Patient ID: Carrie Mckenzie, female   DOB: 02/19/1991, 29 y.o.   MRN: [id number]   [facility] Clinic Visit  @DATE @            Patient name: Carrie Mckenzie MRN Donell Sievert  Date of birth: 1991/05/01  CC & HPI:  [name] is a 29 y.o. female presenting today for vaginal odor and discharge. She noted "fishy" odor around the end of December and intermittently went away. She did not have discharge at that time. She was seen by a provider once again and was prescribed Flagyl in January which had alleviated vaginal odor. Vaginal discharge has just recently started along with consistent vaginal odor. She has used gel to help and had issues with recurrent BV. She notes after her and her partner have sex she has vaginal itching the next day but itching is not prominent.   ROS:  ROS +recurrent BV +vaginal discharge w/ odor +vaginal itching -fever -chills  Pertinent History Reviewed:   Reviewed: Significant for Medical         Past Medical History:  Diagnosis Date  . Anxiety   . BV (bacterial vaginosis) 12/17/2013  . Ovarian cyst   . Vaginal discharge 12/17/2013                              Surgical Hx:    Past Surgical History:  Procedure Laterality Date  . CHOLECYSTECTOMY    . WISDOM TOOTH EXTRACTION     Medications: Reviewed & Updated - see associated section                       Current Outpatient Medications:  .  DAYSEE 0.15-0.03 &0.01 MG tablet, TAKE 1 TABLET BY MOUTH ONCE DAILY., Disp: 1 Package, Rfl: 11 .  loratadine (CLARITIN) 10 MG tablet, Take 10 mg by mouth daily., Disp: , Rfl:  .  VITAMIN D PO, Take 5,000 Units by mouth daily., Disp: , Rfl:    Social History: Reviewed -  reports that she has been smoking cigarettes. She has a 5.00 pack-year smoking history. She has never used smokeless tobacco.  Objective Findings:  Vitals: Blood pressure (!) 149/86, pulse 70, height 5\' 6"  (1.676 m), weight 142 lb 6.4 oz (64.6 kg), last menstrual period 07/26/2019.  PHYSICAL  EXAMINATION General appearance - alert, well appearing, and in no distress Mental status - alert, oriented to person, place, and time, normal mood, behavior, speech, dress, motor activity, and thought processes, affect appropriate to mood  PELVIC Vagina - non purulent white dicharge Cervix - normal appearing  Uterus - not examined  KOH - neg yeast Wet [place] - clue cells, neg trich GCCHL  collected  Assessment & Plan:   A:  1.  Recurrent  BV  P:  1.  Metrogel x 3 d or after each menses.    By signing my name below, I, , attest that this documentation has been prepared under the direction and in the presence of 07/28/2019, MD. Electronically Signed: Arnette Norris Medical Scribe. 10/05/19. 9:10 AM.  I personally performed the services described in this documentation, which was SCRIBED in my presence. The recorded information has been reviewed and considered accurate. It has been edited as necessary during review. Arnette Norris, MD

## 2019-10-05 NOTE — Addendum Note (Signed)
Addended by: Colen Darling on: 10/05/2019 09:52 AM   Modules accepted: Orders

## 2019-10-08 ENCOUNTER — Telehealth: Payer: Self-pay | Admitting: *Deleted

## 2019-10-08 LAB — CERVICOVAGINAL ANCILLARY ONLY
Comment: NEGATIVE
Trichomonas: NEGATIVE

## 2019-10-08 NOTE — Telephone Encounter (Signed)
Patient called requesting oral Metrogel.  States the vaginal gel is not covered and is $200. Called pharmacy and they were able to run it through.  Pt informed.

## 2019-10-23 ENCOUNTER — Encounter: Payer: Medicaid Other | Admitting: Obstetrics & Gynecology

## 2019-11-22 ENCOUNTER — Encounter: Payer: Self-pay | Admitting: *Deleted

## 2019-11-22 ENCOUNTER — Encounter: Payer: Medicaid Other | Admitting: Obstetrics & Gynecology

## 2020-02-19 ENCOUNTER — Ambulatory Visit (INDEPENDENT_AMBULATORY_CARE_PROVIDER_SITE_OTHER): Payer: Medicaid Other | Admitting: Obstetrics & Gynecology

## 2020-02-19 ENCOUNTER — Other Ambulatory Visit: Payer: Self-pay | Admitting: Obstetrics & Gynecology

## 2020-02-19 ENCOUNTER — Encounter: Payer: Self-pay | Admitting: Obstetrics & Gynecology

## 2020-02-19 VITALS — BP 153/92 | HR 96 | Ht 66.0 in | Wt 133.0 lb

## 2020-02-19 DIAGNOSIS — R87810 Cervical high risk human papillomavirus (HPV) DNA test positive: Secondary | ICD-10-CM

## 2020-02-19 DIAGNOSIS — D069 Carcinoma in situ of cervix, unspecified: Secondary | ICD-10-CM

## 2020-02-19 DIAGNOSIS — Z3202 Encounter for pregnancy test, result negative: Secondary | ICD-10-CM | POA: Diagnosis not present

## 2020-02-19 LAB — POCT URINE PREGNANCY: Preg Test, Ur: NEGATIVE

## 2020-02-19 NOTE — Progress Notes (Signed)
Colposcopy Procedure Note:  Colposcopy Procedure Note  Indications:  2021   ASCUS/+ HPV 16 2019   Normal 2018   LSIL    2019 ASCCP recommendation:  Smoker:  Yes.  1ppd New sexual partner:  No.  : time frame:  No.  History of abnormal Pap: yes  Procedure Details  The risks and benefits of the procedure and Written informed consent obtained.  Speculum placed in vagina and excellent visualization of cervix achieved, cervix swabbed x 3 with acetic acid solution.  Findings: Cervix: +punctation/dense AWE/ light mosaicism; SCJ visualized - lesion at 12 o'clock. Biopsy at 12 o'clock Vaginal inspection: vaginal colposcopy not performed. Vulvar colposcopy: vulvar colposcopy not performed.  Specimens: cervical biopsy     ICD-10-CM   1. 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 Urine pregnancy test negative  Z32.02 POCT urine pregnancy    Complications: none.  Plan(Based on 2019 ASCCP recommendations) Return to discuss Pathology results in 2 weeks. MyChart

## 2020-02-19 NOTE — Addendum Note (Signed)
Addended by: Lynnell Dike on: 02/19/2020 04:49 PM   Modules accepted: Orders

## 2020-03-06 ENCOUNTER — Encounter: Payer: Self-pay | Admitting: Obstetrics & Gynecology

## 2020-03-06 ENCOUNTER — Telehealth (INDEPENDENT_AMBULATORY_CARE_PROVIDER_SITE_OTHER): Payer: Medicaid Other | Admitting: Obstetrics & Gynecology

## 2020-03-06 VITALS — Ht 66.0 in

## 2020-03-06 DIAGNOSIS — R87613 High grade squamous intraepithelial lesion on cytologic smear of cervix (HGSIL): Secondary | ICD-10-CM | POA: Diagnosis not present

## 2020-03-06 NOTE — Progress Notes (Signed)
Patient ID: Carrie Mckenzie, female   DOB: Aug 22, 1990, 29 y.o.   MRN: [id number]   TELEHEALTH VIRTUAL GYNECOLOGY VISIT ENCOUNTER NOTE  I connected with Carrie Mckenzie on 03/06/20 at  9:50 AM EDT byMyChart connect and verified that I am speaking with the correct person using two identifiers.   I discussed the limitations, risks, security and privacy concerns of performing an evaluation and management service by telephone and the availability of in person appointments. I also discussed with the patient that there may be a patient responsible charge related to this service. The patient expressed understanding and agreed to proceed.   History:  Carrie Mckenzie is a 29 y.o. [phone] female being evaluated today for follow up results related to her colposcopically directed cervical biopsies.  I had previously discussed via MyChart messaging her results and possible surgical therapeutic options.  . She denies any abnormal vaginal discharge, bleeding, pelvic pain or other concerns.       Past Medical History:  Diagnosis Date  . Anxiety   . BV (bacterial vaginosis) 12/17/2013  . Ovarian cyst   . Vaginal discharge 12/17/2013   Past Surgical History:  Procedure Laterality Date  . CHOLECYSTECTOMY    . WISDOM TOOTH EXTRACTION     The following portions of the patient's history were reviewed and updated as appropriate: allergies, current medications, past family history, past medical history, past social history, past surgical history and problem list.   Health Maintenance:  N/a today  Review of Systems:  Pertinent items noted in HPI and remainder of comprehensive ROS otherwise negative.  Physical Exam:  Physical exam deferred due to nature of the encounter  Labs and Imaging   cervical biopsy HSIL  No orders of the defined types were placed in this encounter.   No orders of the defined types were placed in this encounter.   Assessment and Plan:       ICD-10-CM   1. High grade squamous  intraepithelial cervical dysplasia  R87.613    on colposcopically directed cervical biopsy.  discussed therapeutic options and patint decides on laser ablation 04/02/20 [facility]         I discussed the assessment and treatment plan with the patient. The patient was provided an opportunity to ask questions and all were answered. The patient agreed with the plan and demonstrated an understanding of the instructions.   The patient was advised to call back or seek an in-person evaluation/go to the ED if the symptoms worsen or if the condition fails to improve as anticipated.  I provided 11 minutes of non-face-to-face time during this encounter.   Lazaro Arms, MD [facility]

## 2020-03-13 ENCOUNTER — Other Ambulatory Visit: Payer: Self-pay | Admitting: Advanced Practice Midwife

## 2020-03-26 ENCOUNTER — Other Ambulatory Visit: Payer: Self-pay | Admitting: Obstetrics & Gynecology

## 2020-03-28 NOTE — Patient Instructions (Signed)
Carrie Mckenzie  03/28/2020     @   Your procedure is scheduled on  04/02/2020.  Report to [facility] at  [phone]  A.M.  Call this number if you have problems the morning of surgery:  [phone]   Remember:  Make sure you have completed your carb drink by 0540 am 04/02/2020. You may have clear liquids until your carb drink is completed. NO solid foods after midnight and no more liquids once your carb drink is consumed.                      Take these medicines the morning of surgery with A SIP OF WATER  adderall.    Do not wear jewelry, make-up or nail polish.  Do not wear lotions, powders, or perfumes. Please wear deodorant and brush your teeth.  Do not shave 48 hours prior to surgery.  Men may shave face and neck.  Do not bring valuables to the hospital.  [facility] is not responsible for any belongings or valuables.  Contacts, dentures or bridgework may not be worn into surgery.  Leave your suitcase in the car.  After surgery it may be brought to your room.  For patients admitted to the hospital, discharge time will be determined by your treatment team.  Patients discharged the day of surgery will not be allowed to drive home.   Name and phone number of your driver:   family Special instructions:  DO NOT smoke the morning of your procedure.  Please read over the following fact sheets that you were given. Anesthesia Post-op Instructions and Care and Recovery After Surgery       Cervical Laser Surgery, Care After This sheet gives you information about how to care for yourself after your procedure. Your health care provider may also give you more specific instructions. If you have problems or questions, contact your health care provider. What can I expect after the procedure? After the procedure, it is common to have:  Pain or discomfort.  Mild cramping.  Bleeding, spotting, or brownish discharge from your vagina. Follow these  instructions at home: Activity   Rest as told by your health care provider.  Do not lift anything that is heavier than 10 lb (4.5 kg), or the limit that you are told, until your health care provider says that it is safe.  Do not have sex until your health care provider says it is okay.  Return to your normal activities as told by your health care provider. Ask your health care provider what activities are safe for you. General instructions  Take over-the-counter and prescription medicines only as told by your health care provider.  Ask your health care provider if the medicine prescribed to you requires you to avoid driving or using heavy machinery.  Wear sanitary pads to absorb any bleeding, spotting, and discharge.  Do not douche or put anything into your vagina, including tampons, until your health care provider says it is okay.  It is up to you to get the results of your procedure. Ask your health care provider, or the department that is doing the procedure, when your results will be ready.  Keep all follow-up visits as told by your health care provider. This is important. Contact a health care provider if:  Your pain or cramping does not improve.  Your periods are more painful than usual.  You do not get your period as expected.  Get help right away if you have:  Any symptoms of infection, such as: ? A fever. ? Chills. ? Discharge that smells bad.  Severe pain in your abdomen.  Heavy bleeding from your vagina (more than a normal period).  Vaginal bleeding with clumps of blood (blood clots). Summary  After this procedure, it is common to have pain or discomfort and mild cramping. It is also common to have bleeding, spotting, or brownish discharge from your vagina.  Do not have sex, douche, use tampons, or put anything in your vagina until your health care provider says it is okay.  Return to your normal activities as told by your health care provider. Ask your  health care provider what activities are safe for you.  Take over-the-counter and prescription medicines only as told by your health care provider.  You may need to wear sanitary pads to absorb any bleeding, spotting, and discharge. This information is not intended to replace advice given to you by your health care provider. Make sure you discuss any questions you have with your health care provider. Document Revised: 01/08/2019 Document Reviewed: 01/08/2019 Elsevier Patient Education  2020 [employer]  General Anesthesia, Adult, Care After This sheet gives you information about how to care for yourself after your procedure. Your health care provider may also give you more specific instructions. If you have problems or questions, contact your health care provider. What can I expect after the procedure? After the procedure, the following side effects are common:  Pain or discomfort at the IV site.  Nausea.  Vomiting.  Sore throat.  Trouble concentrating.  Feeling cold or chills.  Weak or tired.  Sleepiness and fatigue.  Soreness and body aches. These side effects can affect parts of the body that were not involved in surgery. Follow these instructions at home:  For at least 24 hours after the procedure:  Have a responsible adult stay with you. It is important to have someone help care for you until you are awake and alert.  Rest as needed.  Do not: ? Participate in activities in which you could fall or become injured. ? Drive. ? Use heavy machinery. ? Drink alcohol. ? Take sleeping pills or medicines that cause drowsiness. ? Make important decisions or sign legal documents. ? Take care of children on your own. Eating and drinking  Follow any instructions from your health care provider about eating or drinking restrictions.  When you feel hungry, start by eating small amounts of foods that are soft and easy to digest (bland), such as toast. Gradually return to your  regular diet.  Drink enough fluid to keep your urine pale yellow.  If you vomit, rehydrate by drinking water, juice, or clear broth. General instructions  If you have sleep apnea, surgery and certain medicines can increase your risk for breathing problems. Follow instructions from your health care provider about wearing your sleep device: ? Anytime you are sleeping, including during daytime naps. ? While taking prescription pain medicines, sleeping medicines, or medicines that make you drowsy.  Return to your normal activities as told by your health care provider. Ask your health care provider what activities are safe for you.  Take over-the-counter and prescription medicines only as told by your health care provider.  If you smoke, do not smoke without supervision.  Keep all follow-up visits as told by your health care provider. This is important. Contact a health care provider if:  You have nausea or vomiting that does not get  better with medicine.  You cannot eat or drink without vomiting.  You have pain that does not get better with medicine.  You are unable to pass urine.  You develop a skin rash.  You have a fever.  You have redness around your IV site that gets worse. Get help right away if:  You have difficulty breathing.  You have chest pain.  You have blood in your urine or stool, or you vomit blood. Summary  After the procedure, it is common to have a sore throat or nausea. It is also common to feel tired.  Have a responsible adult stay with you for the first 24 hours after general anesthesia. It is important to have someone help care for you until you are awake and alert.  When you feel hungry, start by eating small amounts of foods that are soft and easy to digest (bland), such as toast. Gradually return to your regular diet.  Drink enough fluid to keep your urine pale yellow.  Return to your normal activities as told by your health care provider. Ask  your health care provider what activities are safe for you. This information is not intended to replace advice given to you by your health care provider. Make sure you discuss any questions you have with your health care provider. Document Revised: 07/29/2017 Document Reviewed: 03/11/2017 Elsevier Patient Education  2020 [employer]. How to Use Chlorhexidine for Bathing Chlorhexidine gluconate (CHG) is a germ-killing (antiseptic) solution that is used to clean the skin. It can get rid of the bacteria that normally live on the skin and can keep them away for about 24 hours. To clean your skin with CHG, you may be given:  A CHG solution to use in the shower or as part of a sponge bath.  A prepackaged cloth that contains CHG. Cleaning your skin with CHG may help lower the risk for infection:  While you are staying in the intensive care unit of the hospital.  If you have a vascular access, such as a central line, to provide short-term or long-term access to your veins.  If you have a catheter to drain urine from your bladder.  If you are on a ventilator. A ventilator is a machine that helps you breathe by moving air in and out of your lungs.  After surgery. What are the risks? Risks of using CHG include:  A skin reaction.  Hearing loss, if CHG gets in your ears.  Eye injury, if CHG gets in your eyes and is not rinsed out.  The CHG product catching fire. Make sure that you avoid smoking and flames after applying CHG to your skin. Do not use CHG:  If you have a chlorhexidine allergy or have previously reacted to chlorhexidine.  On babies younger than 19 months of age. How to use CHG solution  Use CHG only as told by your health care provider, and follow the instructions on the label.  Use the full amount of CHG as directed. Usually, this is one bottle. During a shower Follow these steps when using CHG solution during a shower (unless your health care provider gives you different  instructions): 1. Start the shower. 2. Use your normal soap and shampoo to wash your face and hair. 3. Turn off the shower or move out of the shower stream. 4. Pour the CHG onto a clean washcloth. Do not use any type of brush or rough-edged sponge. 5. Starting at your neck, lather your body down to  your toes. Make sure you follow these instructions: ? If you will be having surgery, pay special attention to the part of your body where you will be having surgery. Scrub this area for at least 1 minute. ? Do not use CHG on your head or face. If the solution gets into your ears or eyes, rinse them well with water. ? Avoid your genital area. ? Avoid any areas of skin that have broken skin, cuts, or scrapes. ? Scrub your back and under your arms. Make sure to wash skin folds. 6. Let the lather sit on your skin for 1-2 minutes or as long as told by your health care provider. 7. Thoroughly rinse your entire body in the shower. Make sure that all body creases and crevices are rinsed well. 8. Dry off with a clean towel. Do not put any substances on your body afterward--such as powder, lotion, or perfume--unless you are told to do so by your health care provider. Only use lotions that are recommended by the manufacturer. 9. Put on clean clothes or pajamas. 10. If it is the night before your surgery, sleep in clean sheets.  During a sponge bath Follow these steps when using CHG solution during a sponge bath (unless your health care provider gives you different instructions): 1. Use your normal soap and shampoo to wash your face and hair. 2. Pour the CHG onto a clean washcloth. 3. Starting at your neck, lather your body down to your toes. Make sure you follow these instructions: ? If you will be having surgery, pay special attention to the part of your body where you will be having surgery. Scrub this area for at least 1 minute. ? Do not use CHG on your head or face. If the solution gets into your ears or  eyes, rinse them well with water. ? Avoid your genital area. ? Avoid any areas of skin that have broken skin, cuts, or scrapes. ? Scrub your back and under your arms. Make sure to wash skin folds. 4. Let the lather sit on your skin for 1-2 minutes or as long as told by your health care provider. 5. Using a different clean, wet washcloth, thoroughly rinse your entire body. Make sure that all body creases and crevices are rinsed well. 6. Dry off with a clean towel. Do not put any substances on your body afterward--such as powder, lotion, or perfume--unless you are told to do so by your health care provider. Only use lotions that are recommended by the manufacturer. 7. Put on clean clothes or pajamas. 8. If it is the night before your surgery, sleep in clean sheets. How to use CHG prepackaged cloths  Only use CHG cloths as told by your health care provider, and follow the instructions on the label.  Use the CHG cloth on clean, dry skin.  Do not use the CHG cloth on your head or face unless your health care provider tells you to.  When washing with the CHG cloth: ? Avoid your genital area. ? Avoid any areas of skin that have broken skin, cuts, or scrapes. Before surgery Follow these steps when using a CHG cloth to clean before surgery (unless your health care provider gives you different instructions): 1. Using the CHG cloth, vigorously scrub the part of your body where you will be having surgery. Scrub using a back-and-forth motion for 3 minutes. The area on your body should be completely wet with CHG when you are done scrubbing. 2. Do not rinse.  Discard the cloth and let the area air-dry. Do not put any substances on the area afterward, such as powder, lotion, or perfume. 3. Put on clean clothes or pajamas. 4. If it is the night before your surgery, sleep in clean sheets.  For general bathing Follow these steps when using CHG cloths for general bathing (unless your health care provider  gives you different instructions). 1. Use a separate CHG cloth for each area of your body. Make sure you wash between any folds of skin and between your fingers and toes. Wash your body in the following order, switching to a new cloth after each step: ? The front of your neck, shoulders, and chest. ? Both of your arms, under your arms, and your hands. ? Your stomach and groin area, avoiding the genitals. ? Your right leg and foot. ? Your left leg and foot. ? The back of your neck, your back, and your buttocks. 2. Do not rinse. Discard the cloth and let the area air-dry. Do not put any substances on your body afterward--such as powder, lotion, or perfume--unless you are told to do so by your health care provider. Only use lotions that are recommended by the manufacturer. 3. Put on clean clothes or pajamas. Contact a health care provider if:  Your skin gets irritated after scrubbing.  You have questions about using your solution or cloth. Get help right away if:  Your eyes become very red or swollen.  Your eyes itch badly.  Your skin itches badly and is red or swollen.  Your hearing changes.  You have trouble seeing.  You have swelling or tingling in your mouth or throat.  You have trouble breathing.  You swallow any chlorhexidine. Summary  Chlorhexidine gluconate (CHG) is a germ-killing (antiseptic) solution that is used to clean the skin. Cleaning your skin with CHG may help to lower your risk for infection.  You may be given CHG to use for bathing. It may be in a bottle or in a prepackaged cloth to use on your skin. Carefully follow your health care provider's instructions and the instructions on the product label.  Do not use CHG if you have a chlorhexidine allergy.  Contact your health care provider if your skin gets irritated after scrubbing. This information is not intended to replace advice given to you by your health care provider. Make sure you discuss any questions  you have with your health care provider. Document Revised: 10/12/2018 Document Reviewed: 06/23/2017 Elsevier Patient Education  2020 [employer]

## 2020-03-31 ENCOUNTER — Other Ambulatory Visit: Payer: Self-pay

## 2020-03-31 ENCOUNTER — Encounter (HOSPITAL_COMMUNITY)
Admission: RE | Admit: 2020-03-31 | Discharge: 2020-03-31 | Disposition: A | Discharge status: Home / Self Care | Payer: No Typology Code available for payment source | Source: Ambulatory Visit | Attending: Obstetrics & Gynecology | Admitting: Obstetrics & Gynecology

## 2020-03-31 ENCOUNTER — Encounter (HOSPITAL_COMMUNITY): Payer: Self-pay

## 2020-03-31 ENCOUNTER — Other Ambulatory Visit (HOSPITAL_COMMUNITY)
Admission: RE | Admit: 2020-03-31 | Discharge: 2020-03-31 | Disposition: A | Discharge status: Home / Self Care | Payer: Medicaid Other | Source: Ambulatory Visit | Attending: Obstetrics & Gynecology | Admitting: Obstetrics & Gynecology

## 2020-03-31 DIAGNOSIS — Z20822 Contact with and (suspected) exposure to covid-19: Secondary | ICD-10-CM | POA: Diagnosis not present

## 2020-03-31 DIAGNOSIS — Z01812 Encounter for preprocedural laboratory examination: Secondary | ICD-10-CM | POA: Insufficient documentation

## 2020-03-31 LAB — CBC
HCT: 40.9 % (ref 36.0–46.0)
Hemoglobin: 13.5 g/dL (ref 12.0–15.0)
MCH: 30.3 pg (ref 26.0–34.0)
MCHC: 33 g/dL (ref 30.0–36.0)
MCV: 91.9 fL (ref 80.0–100.0)
Platelets: 227 10*3/uL (ref 150–400)
RBC: 4.45 MIL/uL (ref 3.87–5.11)
RDW: 13.3 % (ref 11.5–15.5)
WBC: 5.2 10*3/uL (ref 4.0–10.5)
nRBC: 0 % (ref 0.0–0.2)

## 2020-03-31 LAB — COMPREHENSIVE METABOLIC PANEL
ALT: 14 U/L (ref 0–44)
AST: 16 U/L (ref 15–41)
Albumin: 3.8 g/dL (ref 3.5–5.0)
Alkaline Phosphatase: 41 U/L (ref 38–126)
Anion gap: 7 (ref 5–15)
BUN: 10 mg/dL (ref 6–20)
CO2: 23 mmol/L (ref 22–32)
Calcium: 8.7 mg/dL — ABNORMAL LOW (ref 8.9–10.3)
Chloride: 105 mmol/L (ref 98–111)
Creatinine, Ser: 0.73 mg/dL (ref 0.44–1.00)
GFR calc Af Amer: >60 mL/min (ref >60)
GFR calc non Af Amer: >60 mL/min (ref >60)
Glucose, Bld: 83 mg/dL (ref 70–99)
Potassium: 3.2 mmol/L — ABNORMAL LOW (ref 3.5–5.1)
Sodium: 135 mmol/L (ref 135–145)
Total Bilirubin: 0.4 mg/dL (ref 0.3–1.2)
Total Protein: 7.1 g/dL (ref 6.5–8.1)

## 2020-03-31 LAB — RAPID HIV SCREEN (HIV 1/2 AB+AG)
HIV 1/2 Antibodies: NONREACTIVE
HIV-1 P24 Antigen - HIV24: NONREACTIVE

## 2020-03-31 LAB — HCG, QUANTITATIVE, PREGNANCY: hCG, Beta Chain, Quant, S: <1 m[IU]/mL (ref <5)

## 2020-04-01 LAB — SARS CORONAVIRUS 2 (TAT 6-24 HRS): SARS Coronavirus 2: NEGATIVE

## 2020-04-02 ENCOUNTER — Ambulatory Visit (HOSPITAL_COMMUNITY): Payer: Medicaid Other | Admitting: Anesthesiology

## 2020-04-02 ENCOUNTER — Encounter (HOSPITAL_COMMUNITY)
Admission: RE | Disposition: A | Discharge status: Home / Self Care | Payer: Self-pay | Source: Home / Self Care | Attending: Obstetrics & Gynecology

## 2020-04-02 ENCOUNTER — Ambulatory Visit (HOSPITAL_COMMUNITY)
Admission: RE | Admit: 2020-04-02 | Discharge: 2020-04-02 | Disposition: A | Discharge status: Home / Self Care | Payer: Medicaid Other | Attending: Obstetrics & Gynecology | Admitting: Obstetrics & Gynecology

## 2020-04-02 ENCOUNTER — Encounter (HOSPITAL_COMMUNITY): Payer: Self-pay | Admitting: Obstetrics & Gynecology

## 2020-04-02 DIAGNOSIS — F17210 Nicotine dependence, cigarettes, uncomplicated: Secondary | ICD-10-CM | POA: Insufficient documentation

## 2020-04-02 DIAGNOSIS — F419 Anxiety disorder, unspecified: Secondary | ICD-10-CM | POA: Insufficient documentation

## 2020-04-02 DIAGNOSIS — Z8042 Family history of malignant neoplasm of prostate: Secondary | ICD-10-CM | POA: Diagnosis not present

## 2020-04-02 DIAGNOSIS — Z9049 Acquired absence of other specified parts of digestive tract: Secondary | ICD-10-CM | POA: Diagnosis not present

## 2020-04-02 DIAGNOSIS — Z8379 Family history of other diseases of the digestive system: Secondary | ICD-10-CM | POA: Diagnosis not present

## 2020-04-02 DIAGNOSIS — R87613 High grade squamous intraepithelial lesion on cytologic smear of cervix (HGSIL): Secondary | ICD-10-CM | POA: Diagnosis present

## 2020-04-02 DIAGNOSIS — Z8349 Family history of other endocrine, nutritional and metabolic diseases: Secondary | ICD-10-CM | POA: Diagnosis not present

## 2020-04-02 DIAGNOSIS — Z8041 Family history of malignant neoplasm of ovary: Secondary | ICD-10-CM | POA: Diagnosis not present

## 2020-04-02 DIAGNOSIS — Z8049 Family history of malignant neoplasm of other genital organs: Secondary | ICD-10-CM | POA: Insufficient documentation

## 2020-04-02 DIAGNOSIS — Z818 Family history of other mental and behavioral disorders: Secondary | ICD-10-CM | POA: Diagnosis not present

## 2020-04-02 DIAGNOSIS — Z833 Family history of diabetes mellitus: Secondary | ICD-10-CM | POA: Insufficient documentation

## 2020-04-02 HISTORY — PX: CERVICAL ABLATION: SHX5771

## 2020-04-02 LAB — ABO/RH: ABO/RH(D): A POS

## 2020-04-02 SURGERY — ABLATION, CERVIX
Anesthesia: General | Site: Cervix

## 2020-04-02 MED ORDER — FENTANYL CITRATE (PF) 100 MCG/2ML IJ SOLN: 25.0000 ug | INTRAMUSCULAR | Status: DC | PRN

## 2020-04-02 MED ORDER — KETOROLAC TROMETHAMINE 30 MG/ML IJ SOLN
INTRAMUSCULAR | Status: AC
  Filled 2020-04-02: qty 1

## 2020-04-02 MED ORDER — CHLORHEXIDINE GLUCONATE 0.12 % MT SOLN
15.0000 mL | Freq: Once | OROMUCOSAL | Status: AC
  Administered 2020-04-02: 15 mL via OROMUCOSAL

## 2020-04-02 MED ORDER — ONDANSETRON HCL 4 MG/2ML IJ SOLN
INTRAMUSCULAR | Status: AC
  Filled 2020-04-02: qty 2

## 2020-04-02 MED ORDER — CEFAZOLIN SODIUM-DEXTROSE 2-4 GM/100ML-% IV SOLN
INTRAVENOUS | Status: AC
  Filled 2020-04-02: qty 100

## 2020-04-02 MED ORDER — MIDAZOLAM HCL 5 MG/5ML IJ SOLN
INTRAMUSCULAR | Status: DC | PRN
  Administered 2020-04-02: 2 mg via INTRAVENOUS

## 2020-04-02 MED ORDER — ONDANSETRON HCL 4 MG/2ML IJ SOLN: 4.0000 mg | Freq: Once | INTRAMUSCULAR | Status: DC | PRN

## 2020-04-02 MED ORDER — WATER FOR IRRIGATION, STERILE IR SOLN
Status: DC | PRN
  Administered 2020-04-02: 1000 mL

## 2020-04-02 MED ORDER — FERRIC SUBSULFATE 259 MG/GM EX SOLN
CUTANEOUS | Status: DC | PRN
  Administered 2020-04-02: 1

## 2020-04-02 MED ORDER — POVIDONE-IODINE 10 % EX SWAB: 2.0000 "application " | Freq: Once | CUTANEOUS | Status: DC

## 2020-04-02 MED ORDER — CEFAZOLIN SODIUM-DEXTROSE 2-4 GM/100ML-% IV SOLN: 2.0000 g | INTRAVENOUS | Status: DC

## 2020-04-02 MED ORDER — PROPOFOL 10 MG/ML IV BOLUS
INTRAVENOUS | Status: AC
  Filled 2020-04-02: qty 20

## 2020-04-02 MED ORDER — KETOROLAC TROMETHAMINE 10 MG PO TABS: 10.0000 mg | ORAL_TABLET | Freq: Three times a day (TID) | ORAL | 0 refills | Status: DC | PRN

## 2020-04-02 MED ORDER — LACTATED RINGERS IV SOLN: INTRAVENOUS | Status: DC

## 2020-04-02 MED ORDER — ONDANSETRON 8 MG PO TBDP: 8.0000 mg | ORAL_TABLET | Freq: Three times a day (TID) | ORAL | 0 refills | Status: DC | PRN

## 2020-04-02 MED ORDER — ACETIC ACID 5 % SOLN
Status: DC | PRN
  Administered 2020-04-02: 1 via TOPICAL

## 2020-04-02 MED ORDER — ONDANSETRON HCL 4 MG/2ML IJ SOLN
INTRAMUSCULAR | Status: DC | PRN
  Administered 2020-04-02: 4 mg via INTRAVENOUS

## 2020-04-02 MED ORDER — PROPOFOL 10 MG/ML IV BOLUS
INTRAVENOUS | Status: DC | PRN
  Administered 2020-04-02: 150 mg via INTRAVENOUS

## 2020-04-02 MED ORDER — KETOROLAC TROMETHAMINE 30 MG/ML IJ SOLN
30.0000 mg | Freq: Once | INTRAMUSCULAR | Status: AC
  Administered 2020-04-02: 30 mg via INTRAVENOUS

## 2020-04-02 MED ORDER — FENTANYL CITRATE (PF) 100 MCG/2ML IJ SOLN
INTRAMUSCULAR | Status: AC
  Filled 2020-04-02: qty 2

## 2020-04-02 MED ORDER — ORAL CARE MOUTH RINSE: 15.0000 mL | Freq: Once | OROMUCOSAL | Status: AC

## 2020-04-02 MED ORDER — MIDAZOLAM HCL 2 MG/2ML IJ SOLN
INTRAMUSCULAR | Status: AC
  Filled 2020-04-02: qty 2

## 2020-04-02 SURGICAL SUPPLY — 30 items
APL FBRTP 16 NS LF PRCTSCP (MISCELLANEOUS) ×1
APL SWBSTK 6 STRL LF DISP (MISCELLANEOUS) ×1
APPLICATOR COTTON TIP 6 STRL (MISCELLANEOUS) ×1 IMPLANT
APPLICATOR COTTON TIP 6IN STRL (MISCELLANEOUS) ×3
BAG HAMPER (MISCELLANEOUS) ×3 IMPLANT
CLOTH BEACON ORANGE TIMEOUT ST (SAFETY) ×3 IMPLANT
COVER LIGHT HANDLE STERIS (MISCELLANEOUS) ×6 IMPLANT
COVER MAYO STAND XLG (MISCELLANEOUS) ×3 IMPLANT
COVER WAND RF STERILE (DRAPES) ×6 IMPLANT
GAUZE 4X4 16PLY RFD (DISPOSABLE) ×3 IMPLANT
GLOVE BIOGEL PI IND STRL 7.0 (GLOVE) ×2 IMPLANT
GLOVE BIOGEL PI IND STRL 8 (GLOVE) ×1 IMPLANT
GLOVE BIOGEL PI INDICATOR 7.0 (GLOVE) ×4
GLOVE BIOGEL PI INDICATOR 8 (GLOVE) ×2
GLOVE ECLIPSE 8.0 STRL XLNG CF (GLOVE) ×3 IMPLANT
GOWN STRL REUS W/TWL LRG LVL3 (GOWN DISPOSABLE) ×3 IMPLANT
GOWN STRL REUS W/TWL XL LVL3 (GOWN DISPOSABLE) ×3 IMPLANT
KIT TURNOVER KIT A (KITS) ×3 IMPLANT
LASER FIBER DISP 1000U (UROLOGICAL SUPPLIES) ×3 IMPLANT
MANIFOLD NEPTUNE II (INSTRUMENTS) ×3 IMPLANT
MARKER SKIN DUAL TIP RULER LAB (MISCELLANEOUS) ×3 IMPLANT
PACK BASIC III (CUSTOM PROCEDURE TRAY) ×3
PACK SRG BSC III STRL LF ECLPS (CUSTOM PROCEDURE TRAY) ×1 IMPLANT
PAD ARMBOARD 7.5X6 YLW CONV (MISCELLANEOUS) ×3 IMPLANT
PREFILTER SMOKE EVAC (FILTER) ×3 IMPLANT
SET BASIN LINEN APH (SET/KITS/TRAYS/PACK) ×3 IMPLANT
SHEET LAVH (DRAPES) ×3 IMPLANT
SWAB PROCTOSCOPIC (MISCELLANEOUS) ×3 IMPLANT
TUBING SMOKE EVAC CO2 (TUBING) ×3 IMPLANT
WATER STERILE IRR 1000ML POUR (IV SOLUTION) ×3 IMPLANT

## 2020-04-02 NOTE — Anesthesia Preprocedure Evaluation (Signed)
Anesthesia Evaluation  Patient identified by MRN, date of birth, ID band Patient awake    Reviewed: Allergy & Precautions, H&P , NPO status , Patient's Chart, lab work & pertinent test results, reviewed documented beta blocker date and time   Airway Mallampati: II  TM Distance: >3 FB Neck ROM: full    Dental no notable dental hx.    Pulmonary neg pulmonary ROS, Current Smoker and Patient abstained from smoking.,    Pulmonary exam normal breath sounds clear to auscultation       Cardiovascular Exercise Tolerance: Good negative cardio ROS   Rhythm:regular Rate:Normal     Neuro/Psych Anxiety negative neurological ROS  negative psych ROS   GI/Hepatic negative GI ROS, Neg liver ROS,   Endo/Other  negative endocrine ROS  Renal/GU negative Renal ROS  negative genitourinary   Musculoskeletal   Abdominal   Peds  Hematology negative hematology ROS (+)   Anesthesia Other Findings   Reproductive/Obstetrics negative OB ROS                             Anesthesia Physical Anesthesia Plan  ASA: I  Anesthesia Plan: General   Post-op Pain Management:    Induction:   PONV Risk Score and Plan: Propofol infusion  Airway Management Planned:   Additional Equipment:   Intra-op Plan:   Post-operative Plan:   Informed Consent: I have reviewed the patients History and Physical, chart, labs and discussed the procedure including the risks, benefits and alternatives for the proposed anesthesia with the patient or authorized representative who has indicated his/her understanding and acceptance.     Dental Advisory Given  Plan Discussed with: CRNA  Anesthesia Plan Comments:         Anesthesia Quick Evaluation

## 2020-04-02 NOTE — Anesthesia Procedure Notes (Signed)
Procedure Name: LMA Insertion Date/Time: 04/02/2020 9:31 AM Performed by: Moshe Salisbury, CRNA Pre-anesthesia Checklist: Patient identified, Patient being monitored, Emergency Drugs available, Timeout performed and Suction available Patient Re-evaluated:Patient Re-evaluated prior to induction Oxygen Delivery Method: Circle System Utilized Preoxygenation: Pre-oxygenation with 100% oxygen Induction Type: IV induction Ventilation: Mask ventilation without difficulty LMA: LMA inserted LMA Size: 4.0 Number of attempts: 1 Placement Confirmation: positive ETCO2 and breath sounds checked- equal and bilateral

## 2020-04-02 NOTE — H&P (Signed)
Preoperative History and Physical  Carrie Mckenzie is a 29 y.o. [phone] with Patient's last menstrual period was 03/10/2020. admitted for a laser ablation of the cervix for treatment of HSIL of the cervix.  Diagnosed by colposcopically directed cervical biopsy  PMH:    Past Medical History:  Diagnosis Date  . Anxiety   . BV (bacterial vaginosis) 12/17/2013  . Ovarian cyst   . Vaginal discharge 12/17/2013    PSH:     Past Surgical History:  Procedure Laterality Date  . CHOLECYSTECTOMY    . WISDOM TOOTH EXTRACTION      POb/GynH:      OB History    Gravida  4   Para  3   Term  3   Preterm  0   AB  1   Living  3     SAB  1   TAB  0   Ectopic  0   Multiple  0   Live Births  3           SH:   Social History   Tobacco Use  . Smoking status: Current Every Day Smoker    Packs/day: 0.50    Years: 10.00    Pack years: 5.00    Types: Cigarettes  . Smokeless tobacco: Never Used  Vaping Use  . Vaping Use: Some days  Substance Use Topics  . Alcohol use: Yes    Alcohol/week: 0.0 standard drinks    Comment: weekends  . Drug use: No    FH:    Family History  Problem Relation Age of Onset  . Diabetes Mother   . Thyroid disease Mother        had thyroid removed  . Hypertension Mother   . Hypertension Father   . Cancer Father        prostate  . Depression Father   . Anxiety disorder Father   . Cirrhosis Father   . COPD Father   . Kidney disease Father   . Diabetes Maternal Aunt   . Diabetes Maternal Uncle   . Cancer Maternal Grandmother        ovarian, uterine  . Diabetes Maternal Aunt   . Diabetes Maternal Aunt   . Diabetes Maternal Uncle   . Diabetes Maternal Uncle   . Diabetes Maternal Uncle   . Diabetes Maternal Uncle   . Diabetes Maternal Uncle   . Diabetes Maternal Uncle   . ADD / ADHD Daughter   . COPD Maternal Grandfather   . Emphysema Maternal Grandfather   . Asthma Paternal Aunt      Allergies: No Known  Allergies  Medications:       Current Facility-Administered Medications:  .  ceFAZolin (ANCEF) IVPB 2g/100 mL premix, 2 g, Intravenous, On Call to OR, Lazaro Arms, MD .  lactated ringers infusion, , Intravenous, Continuous, Kiel, Mosetta Putt, MD, Last Rate: 10 mL/hr at 04/02/20 0847, New Bag at 04/02/20 0847 .  povidone-iodine 10 % swab 2 application, 2 application, Topical, Once, Roy Tokarz, Amaryllis Dyke, MD  Review of Systems:   Review of Systems  Constitutional: Negative for fever, chills, weight loss, malaise/fatigue and diaphoresis.  HENT: Negative for hearing loss, ear pain, nosebleeds, congestion, sore throat, neck pain, tinnitus and ear discharge.   Eyes: Negative for blurred vision, double vision, photophobia, pain, discharge and redness.  Respiratory: Negative for cough, hemoptysis, sputum production, shortness of breath, wheezing and stridor.   Cardiovascular: Negative for chest pain, palpitations, orthopnea, claudication, leg swelling and  PND.  Gastrointestinal: Positive for abdominal pain. Negative for heartburn, nausea, vomiting, diarrhea, constipation, blood in stool and melena.  Genitourinary: Negative for dysuria, urgency, frequency, hematuria and flank pain.  Musculoskeletal: Negative for myalgias, back pain, joint pain and falls.  Skin: Negative for itching and rash.  Neurological: Negative for dizziness, tingling, tremors, sensory change, speech change, focal weakness, seizures, loss of consciousness, weakness and headaches.  Endo/Heme/Allergies: Negative for environmental allergies and polydipsia. Does not bruise/bleed easily.  Psychiatric/Behavioral: Negative for depression, suicidal ideas, hallucinations, memory loss and substance abuse. The patient is not nervous/anxious and does not have insomnia.      PHYSICAL EXAM:  Blood pressure 135/89, temperature 98 F (36.7 C), resp. rate 11, last menstrual period 03/10/2020, SpO2 100 %.    Vitals reviewed. Constitutional: She  is oriented to person, place, and time. She appears well-developed and well-nourished.  HENT:  Head: Normocephalic and atraumatic.  Right Ear: External ear normal.  Left Ear: External ear normal.  Nose: Nose normal.  Mouth/Throat: Oropharynx is clear and moist.  Eyes: Conjunctivae and EOM are normal. Pupils are equal, round, and reactive to light. Right eye exhibits no discharge. Left eye exhibits no discharge. No scleral icterus.  Neck: Normal range of motion. Neck supple. No tracheal deviation present. No thyromegaly present.  Cardiovascular: Normal rate, regular rhythm, normal heart sounds and intact distal pulses.  Exam reveals no gallop and no friction rub.   No murmur heard. Respiratory: Effort normal and breath sounds normal. No respiratory distress. She has no wheezes. She has no rales. She exhibits no tenderness.  GI: Soft. Bowel sounds are normal. She exhibits no distension and no mass. There is tenderness. There is no rebound and no guarding.  Genitourinary:       Vulva is normal without lesions Vagina is pink moist without discharge Cervix per colposcopy note 02/19/20 Uterus is normal size, contour, position, consistency, mobility, non-tender Adnexa is negative with normal sized ovaries by sonogram  Musculoskeletal: Normal range of motion. She exhibits no edema and no tenderness.  Neurological: She is alert and oriented to person, place, and time. She has normal reflexes. She displays normal reflexes. No cranial nerve deficit. She exhibits normal muscle tone. Coordination normal.  Skin: Skin is warm and dry. No rash noted. No erythema. No pallor.  Psychiatric: She has a normal mood and affect. Her behavior is normal. Judgment and thought content normal.    Labs: Results for orders placed or performed during the hospital encounter of 03/31/20 (from the past 336 hour(s))  ABO/Rh   Collection Time: 04/02/20  8:40 AM  Result Value Ref Range   ABO/RH(D)      A POS Performed at  [facility], [address] [place], [place] [address]   Results for orders placed or performed during the hospital encounter of 03/31/20 (from the past 336 hour(s))  SARS CORONAVIRUS 2 (TAT 6-24 HRS) Nasopharyngeal Nasopharyngeal Swab   Collection Time: 03/31/20 12:56 PM   Specimen: Nasopharyngeal Swab  Result Value Ref Range   SARS Coronavirus 2 NEGATIVE NEGATIVE  CBC   Collection Time: 03/31/20  1:00 PM  Result Value Ref Range   WBC 5.2 4.0 - 10.5 K/uL   RBC 4.45 3.87 - 5.11 MIL/uL   Hemoglobin 13.5 12.0 - 15.0 g/dL   HCT 85.2 36 - 46 %   MCV 91.9 80.0 - 100.0 fL   MCH 30.3 26.0 - 34.0 pg   MCHC 33.0 30.0 - 36.0 g/dL   RDW 77.8 24.2 -  15.5 %   Platelets 227 150 - 400 K/uL   nRBC 0.0 0.0 - 0.2 %  Comprehensive metabolic panel   Collection Time: 03/31/20  1:00 PM  Result Value Ref Range   Sodium 135 135 - 145 mmol/L   Potassium 3.2 (L) 3.5 - 5.1 mmol/L   Chloride 105 98 - 111 mmol/L   CO2 23 22 - 32 mmol/L   Glucose, Bld 83 70 - 99 mg/dL   BUN 10 6 - 20 mg/dL   Creatinine, Ser 9.140.73 0.44 - 1.00 mg/dL   Calcium 8.7 (L) 8.9 - 10.3 mg/dL   Total Protein 7.1 6.5 - 8.1 g/dL   Albumin 3.8 3.5 - 5.0 g/dL   AST 16 15 - 41 U/L   ALT 14 0 - 44 U/L   Alkaline Phosphatase 41 38 - 126 U/L   Total Bilirubin 0.4 0.3 - 1.2 mg/dL   GFR calc non Af Amer >60 >60 mL/min   GFR calc Af Amer >60 >60 mL/min   Anion gap 7 5 - 15  hCG, quantitative, pregnancy   Collection Time: 03/31/20  1:00 PM  Result Value Ref Range   hCG, Beta Chain, Quant, S <1 <5 mIU/mL  Rapid HIV screen (HIV 1/2 Ab+Ag)   Collection Time: 03/31/20  1:00 PM  Result Value Ref Range   HIV-1 P24 Antigen - HIV24 NON REACTIVE NON REACTIVE   HIV 1/2 Antibodies NON REACTIVE NON REACTIVE   Interpretation (HIV Ag Ab)      A non reactive test result means that HIV 1 or HIV 2 antibodies and HIV 1 p24 antigen were not detected in the specimen.  Type and screen   Collection Time: 03/31/20  1:00 PM  Result Value Ref Range    ABO/RH(D) A POS    Antibody Screen POS    Sample Expiration 04/03/2020,2359    Antibody Identification NO CLINICALLY SIGNIFICANT ANTIBODY IDENTIFIED    Unit Number [id number]    Blood Component Type RED CELLS,LR    Unit division 00    Status of Unit ALLOCATED    Transfusion Status OK TO TRANSFUSE    Crossmatch Result COMPATIBLE    Unit Number      [id number] Performed at [facility], [address] [place], [place] [address]    Blood Component Type      RED CELLS,LR Performed at [facility], [address] [place], [place] [address]    Unit division      00 Performed at [facility], [address] [place], [place] [address]    Status of Unit      ALLOCATED Performed at [facility], [address] [place], [place] [address]    Transfusion Status OK TO TRANSFUSE    Crossmatch Result COMPATIBLE   BPAM RBC   Collection Time: 03/31/20  1:00 PM  Result Value Ref Range   Blood Product Unit Number [id number]    PRODUCT CODE [id number]    Unit Type and Rh 6200    Blood Product Expiration Date [id number]    Blood Product Unit Number [id number]    PRODUCT CODE [id number]    Unit Type and Rh 6200    Blood Product Expiration Date [id number]     EKG: No orders found for this or any previous visit.  Imaging Studies: No results found.    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

## 2020-04-02 NOTE — Anesthesia Postprocedure Evaluation (Signed)
Anesthesia Post Note  Patient: Carrie Mckenzie  Procedure(s) Performed: LASER ABLATION OF CERVIX (N/A Cervix)  Patient location during evaluation: PACU Anesthesia Type: General Level of consciousness: awake and alert and oriented Pain management: pain level controlled Vital Signs Assessment: post-procedure vital signs reviewed and stable Respiratory status: spontaneous breathing Cardiovascular status: blood pressure returned to baseline and stable Postop Assessment: no apparent nausea or vomiting Anesthetic complications: no   No complications documented.   Last Vitals:  Vitals:   04/02/20 0845 04/02/20 1009  BP: 135/89 (P) 124/60  Resp: 11 (P) 14  Temp:  (P) 36.7 C  SpO2:  100%    Last Pain:  Vitals:   04/02/20 1009  PainSc: (P) 0-No pain                 [name]

## 2020-04-02 NOTE — Op Note (Signed)
Preoperative Diagnosis:  High Grade Squamous Intraepithelial lesion, adequate colposcopy. Large lesion, extends well anteriorly and posteriorly as well, almost to the vagina  Postoperative Diagnosis:  Same as above  Procedure:  Laser ablation of the cervix  Surgeon:  Lazaro Arms MD  Anaesthesia:  Laryngeal Mask Airway  Findings:  Patient had an abnormal pap smear which was evaluated in the office with colposcopy and directed biopsies.  Pathology report returned as high Grade SIL.  The colposcopy was adequate and the AWE is quite large and extensive.   As a result, the patient is admitted for laser ablation of the cervix.  Description of Note:  Patient was taken to the OR and placed in the supine position where she underwent laryngeal mask airway anaesthesia.  She was placed in the dorsal lithotomy position.  She was draped for laser.  Graves speculum was placed and 3% acetic acid used and the laser microscope employed to perform colposcopy which confirmed the office findings.  Laser was used on typical cervical settings and used to vaporized the squamocolumnar junction to  depth of  5-7 mm peripherally and 7-9 mm centrally.  Surgical margin of several mm was employed beyond the acetowhite epithelium. Total power 14.89 J.    Hemostasis was achieved with the laser and Monsel's solution.  Patient was awakened from anaesthesia in good stable condition and all counts were correct.  She received Ancef 2 gram and Toradol 30 mg IV preoperatively prophylactically.  Lazaro Arms, MD 04/02/2020 9:58 AM

## 2020-04-02 NOTE — Discharge Instructions (Signed)
      Cervical Laser Surgery, Care After This sheet gives you information about how to care for yourself after your procedure. Your health care provider may also give you more specific instructions. If you have problems or questions, contact your health care provider. What can I expect after the procedure? After the procedure, it is common to have:  Pain or discomfort.  Mild cramping.  Bleeding, spotting, or brownish discharge from your vagina. Follow these instructions at home: Activity   Rest as told by your health care provider.  Do not lift anything that is heavier than 10 lb (4.5 kg), or the limit that you are told, until your health care provider says that it is safe.  Do not have sex until your health care provider says it is okay.  Return to your normal activities as told by your health care provider. Ask your health care provider what activities are safe for you. General instructions  Take over-the-counter and prescription medicines only as told by your health care provider.  Ask your health care provider if the medicine prescribed to you requires you to avoid driving or using heavy machinery.  Wear sanitary pads to absorb any bleeding, spotting, and discharge.  Do not douche or put anything into your vagina, including tampons, until your health care provider says it is okay.  It is up to you to get the results of your procedure. Ask your health care provider, or the department that is doing the procedure, when your results will be ready.  Keep all follow-up visits as told by your health care provider. This is important. Contact a health care provider if:  Your pain or cramping does not improve.  Your periods are more painful than usual.  You do not get your period as expected. Get help right away if you have:  Any symptoms of infection, such as: ? A fever. ? Chills. ? Discharge that smells bad.  Severe pain in your abdomen.  Heavy bleeding from your  vagina (more than a normal period).  Vaginal bleeding with clumps of blood (blood clots). Summary  After this procedure, it is common to have pain or discomfort and mild cramping. It is also common to have bleeding, spotting, or brownish discharge from your vagina.  Do not have sex, douche, use tampons, or put anything in your vagina until your health care provider says it is okay.  Return to your normal activities as told by your health care provider. Ask your health care provider what activities are safe for you.  Take over-the-counter and prescription medicines only as told by your health care provider.  You may need to wear sanitary pads to absorb any bleeding, spotting, and discharge. This information is not intended to replace advice given to you by your health care provider. Make sure you discuss any questions you have with your health care provider. Document Revised: 01/08/2019 Document Reviewed: 01/08/2019 Elsevier Patient Education  2020 Elsevier Inc.     General Anesthesia, Adult, Care After This sheet gives you information about how to care for yourself after your procedure. Your health care provider may also give you more specific instructions. If you have problems or questions, contact your health care provider. What can I expect after the procedure? After the procedure, the following side effects are common:  Pain or discomfort at the IV site.  Nausea.  Vomiting.  Sore throat.  Trouble concentrating.  Feeling cold or chills.  Weak or tired.  Sleepiness and fatigue.    Soreness and body aches. These side effects can affect parts of the body that were not involved in surgery. Follow these instructions at home:  For at least 24 hours after the procedure:  Have a responsible adult stay with you. It is important to have someone help care for you until you are awake and alert.  Rest as needed.  Do not: ? Participate in activities in which you could fall or  become injured. ? Drive. ? Use heavy machinery. ? Drink alcohol. ? Take sleeping pills or medicines that cause drowsiness. ? Make important decisions or sign legal documents. ? Take care of children on your own. Eating and drinking  Follow any instructions from your health care provider about eating or drinking restrictions.  When you feel hungry, start by eating small amounts of foods that are soft and easy to digest (bland), such as toast. Gradually return to your regular diet.  Drink enough fluid to keep your urine pale yellow.  If you vomit, rehydrate by drinking water, juice, or clear broth. General instructions  If you have sleep apnea, surgery and certain medicines can increase your risk for breathing problems. Follow instructions from your health care provider about wearing your sleep device: ? Anytime you are sleeping, including during daytime naps. ? While taking prescription pain medicines, sleeping medicines, or medicines that make you drowsy.  Return to your normal activities as told by your health care provider. Ask your health care provider what activities are safe for you.  Take over-the-counter and prescription medicines only as told by your health care provider.  If you smoke, do not smoke without supervision.  Keep all follow-up visits as told by your health care provider. This is important. Contact a health care provider if:  You have nausea or vomiting that does not get better with medicine.  You cannot eat or drink without vomiting.  You have pain that does not get better with medicine.  You are unable to pass urine.  You develop a skin rash.  You have a fever.  You have redness around your IV site that gets worse. Get help right away if:  You have difficulty breathing.  You have chest pain.  You have blood in your urine or stool, or you vomit blood. Summary  After the procedure, it is common to have a sore throat or nausea. It is also common  to feel tired.  Have a responsible adult stay with you for the first 24 hours after general anesthesia. It is important to have someone help care for you until you are awake and alert.  When you feel hungry, start by eating small amounts of foods that are soft and easy to digest (bland), such as toast. Gradually return to your regular diet.  Drink enough fluid to keep your urine pale yellow.  Return to your normal activities as told by your health care provider. Ask your health care provider what activities are safe for you. This information is not intended to replace advice given to you by your health care provider. Make sure you discuss any questions you have with your health care provider. Document Revised: 07/29/2017 Document Reviewed: 03/11/2017 Elsevier Patient Education  2020 Elsevier Inc.   

## 2020-04-02 NOTE — Transfer of Care (Signed)
Immediate Anesthesia Transfer of Care Note  Patient: Carrie Mckenzie  Procedure(s) Performed: LASER ABLATION OF CERVIX (N/A Cervix)  Patient Location: PACU  Anesthesia Type:General  Level of Consciousness: awake  Airway & Oxygen Therapy: Patient Spontanous Breathing  Post-op Assessment: Report given to RN  Post vital signs: Reviewed and stable  Last Vitals:  Vitals Value Taken Time  BP 124/60 04/02/20 1009  Temp    Pulse 79 04/02/20 1008  Resp 14 04/02/20 1010  SpO2 100 % 04/02/20 1008  Vitals shown include unvalidated device data.  Last Pain:  Vitals:   04/02/20 0830  PainSc: 0-No pain         Complications: No complications documented.

## 2020-04-03 ENCOUNTER — Encounter (HOSPITAL_COMMUNITY): Payer: Self-pay | Admitting: Obstetrics & Gynecology

## 2020-04-04 LAB — BPAM RBC
Blood Product Expiration Date: 202109112359
Blood Product Expiration Date: 202109262359
Unit Type and Rh: 5100
Unit Type and Rh: 6200

## 2020-04-04 LAB — TYPE AND SCREEN
ABO/RH(D): A POS
Antibody Screen: POSITIVE
Unit division: 0
Unit division: 0

## 2020-04-11 ENCOUNTER — Encounter: Payer: Medicaid Other | Admitting: Obstetrics & Gynecology

## 2020-04-17 ENCOUNTER — Encounter: Payer: Self-pay | Admitting: Obstetrics & Gynecology

## 2020-04-17 ENCOUNTER — Other Ambulatory Visit: Payer: Self-pay

## 2020-04-17 ENCOUNTER — Ambulatory Visit (INDEPENDENT_AMBULATORY_CARE_PROVIDER_SITE_OTHER): Payer: No Typology Code available for payment source | Admitting: Obstetrics & Gynecology

## 2020-04-17 VITALS — BP 137/87 | HR 83 | Ht 65.0 in | Wt 125.5 lb

## 2020-04-17 DIAGNOSIS — Z9889 Other specified postprocedural states: Secondary | ICD-10-CM

## 2020-04-17 NOTE — Progress Notes (Signed)
  HPI: Patient returns for routine postoperative follow-up having undergone laser ablation of the cervix on 04/02/20.  The patient's immediate postoperative recovery has been unremarkable. Since hospital discharge the patient reports no problems, discharge which is normal.   Current Outpatient Medications: amphetamine-dextroamphetamine (ADDERALL) 10 MG tablet, Take 10 mg by mouth 2 (two) times daily with a meal., Disp: , Rfl:  clobetasol cream (TEMOVATE) 0.05 %, Apply 1 application topically daily as needed (rash)., Disp: , Rfl:  DAYSEE 0.15-0.03 &0.01 MG tablet, TAKE 1 TABLET BY MOUTH ONCE DAILY. (Patient taking differently: Take 1 tablet by mouth daily. ), Disp: 91 tablet, Rfl: 3 ibuprofen (ADVIL) 200 MG tablet, Take 800 mg by mouth every 6 (six) hours as needed., Disp: , Rfl:  loratadine (CLARITIN) 10 MG tablet, Take 10 mg by mouth daily., Disp: , Rfl:  VITAMIN D PO, Take 2,000 Units by mouth daily., Disp: , Rfl:   No current facility-administered medications for this visit.    Blood pressure 137/87, pulse 83, height 5\' 5"  (1.651 m), weight 125 lb 8 oz (56.9 kg).  Physical Exam: Normal healing cervical laser bed  Diagnostic Tests:   Pathology: HSIL  Impression: Normal post op ablation  Plan: No sex for 3 weeks  Follow up: 6  months  , MD

## 2020-10-16 ENCOUNTER — Encounter: Payer: Self-pay | Admitting: Obstetrics & Gynecology

## 2020-10-16 ENCOUNTER — Ambulatory Visit (INDEPENDENT_AMBULATORY_CARE_PROVIDER_SITE_OTHER): Payer: No Typology Code available for payment source | Admitting: Obstetrics & Gynecology

## 2020-10-16 ENCOUNTER — Other Ambulatory Visit: Payer: Self-pay

## 2020-10-16 ENCOUNTER — Other Ambulatory Visit (HOSPITAL_COMMUNITY)
Admission: RE | Admit: 2020-10-16 | Discharge: 2020-10-16 | Disposition: A | Discharge status: Home / Self Care | Payer: No Typology Code available for payment source | Source: Ambulatory Visit | Attending: Obstetrics & Gynecology | Admitting: Obstetrics & Gynecology

## 2020-10-16 VITALS — BP 132/94 | HR 85 | Ht 65.0 in | Wt 131.0 lb

## 2020-10-16 DIAGNOSIS — Z01419 Encounter for gynecological examination (general) (routine) without abnormal findings: Secondary | ICD-10-CM | POA: Insufficient documentation

## 2020-10-16 DIAGNOSIS — Z9889 Other specified postprocedural states: Secondary | ICD-10-CM

## 2020-10-16 DIAGNOSIS — Z3041 Encounter for surveillance of contraceptive pills: Secondary | ICD-10-CM

## 2020-10-16 MED ORDER — LEVONORGEST-ETH ESTRAD 91-DAY 0.15-0.03 &0.01 MG PO TABS
1.0000 | ORAL_TABLET | Freq: Every day | ORAL | 3 refills | Status: DC
Start: 2020-10-16 — End: 2021-11-05

## 2020-10-16 NOTE — Progress Notes (Signed)
Chief Complaint  Patient presents with  . Abnormal Pap Smear    Pap recheck       30 y.o. [id number] Patient's last menstrual period was 08/25/2020 (approximate). The current method of family planning is OCP (estrogen/progesterone).  Outpatient Encounter Medications as of 10/16/2020  Medication Sig  . amphetamine-dextroamphetamine (ADDERALL) 10 MG tablet Take 10 mg by mouth 2 (two) times daily with a meal.  . clobetasol cream (TEMOVATE) 0.05 % Apply 1 application topically daily as needed (rash).  . hydrochlorothiazide (HYDRODIURIL) 25 MG tablet Take 25 mg by mouth daily.  . hydrOXYzine (ATARAX/VISTARIL) 25 MG tablet Take 25 mg by mouth 3 (three) times daily.  Marland Kitchen ibuprofen (ADVIL) 200 MG tablet Take 800 mg by mouth every 6 (six) hours as needed.  . venlafaxine XR (EFFEXOR-XR) 37.5 MG 24 hr capsule Take 37.5 mg by mouth daily.  Marland Kitchen VITAMIN D PO Take 2,000 Units by mouth daily.  . [DISCONTINUED] DAYSEE 0.15-0.03 &0.01 MG tablet TAKE 1 TABLET BY MOUTH ONCE DAILY. (Patient taking differently: Take 1 tablet by mouth daily.)  . Levonorgestrel-Ethinyl Estradiol (DAYSEE) 0.15-0.03 &0.01 MG tablet Take 1 tablet by mouth daily.  . [DISCONTINUED] loratadine (CLARITIN) 10 MG tablet Take 10 mg by mouth daily.   No facility-administered encounter medications on file as of 10/16/2020.    Subjective Pt is in today for ongoing surveillance of her history of hig grade cervical dysplasia She is s/p laser ablation of the cervix 8/21 This is her first follow up Pap/HPV testing   Past Medical History:  Diagnosis Date  . Anxiety   . BV (bacterial vaginosis) 12/17/2013  . Depression   . Hypertension   . Ovarian cyst   . Vaginal discharge 12/17/2013    Past Surgical History:  Procedure Laterality Date  . CERVICAL ABLATION N/A 04/02/2020   Procedure: LASER ABLATION OF CERVIX;  Surgeon: Lazaro Arms, MD;  Location: [facility];  Service: Gynecology;  Laterality: N/A;  . CHOLECYSTECTOMY    . WISDOM  TOOTH EXTRACTION      OB History    Gravida  4   Para  3   Term  3   Preterm  0   AB  1   Living  3     SAB  1   IAB  0   Ectopic  0   Multiple  0   Live Births  3           No Known Allergies  Social History   Socioeconomic History  . Marital status: Single    Spouse name: Not on file  . Number of children: Not on file  . Years of education: Not on file  . Highest education level: Not on file  Occupational History  . Not on file  Tobacco Use  . Smoking status: Current Every Day Smoker    Packs/day: 0.50    Years: 10.00    Pack years: 5.00    Types: Cigarettes  . Smokeless tobacco: Never Used  Vaping Use  . Vaping Use: Some days  Substance and Sexual Activity  . Alcohol use: Yes    Alcohol/week: 0.0 standard drinks    Comment: weekends  . Drug use: No  . Sexual activity: Yes    Birth control/protection: Pill  Other Topics Concern  . Not on file  Social History Narrative  . Not on file   Social Determinants of Health   Financial Resource Strain: Not on file  Food Insecurity:  Not on file  Transportation Needs: Not on file  Physical Activity: Not on file  Stress: Not on file  Social Connections: Not on file    Family History  Problem Relation Age of Onset  . Diabetes Mother   . Thyroid disease Mother        had thyroid removed  . Hypertension Mother   . Hypertension Father   . Cancer Father        prostate  . Depression Father   . Anxiety disorder Father   . Cirrhosis Father   . COPD Father   . Kidney disease Father   . Diabetes Maternal Aunt   . Diabetes Maternal Uncle   . Cancer Maternal Grandmother        ovarian, uterine  . Diabetes Maternal Aunt   . Diabetes Maternal Aunt   . Diabetes Maternal Uncle   . Diabetes Maternal Uncle   . Diabetes Maternal Uncle   . Diabetes Maternal Uncle   . Diabetes Maternal Uncle   . Diabetes Maternal Uncle   . ADD / ADHD Daughter   . COPD Maternal Grandfather   . Emphysema Maternal  Grandfather   . Asthma Paternal Aunt     Medications:       Current Outpatient Medications:  .  amphetamine-dextroamphetamine (ADDERALL) 10 MG tablet, Take 10 mg by mouth 2 (two) times daily with a meal., Disp: , Rfl:  .  clobetasol cream (TEMOVATE) 0.05 %, Apply 1 application topically daily as needed (rash)., Disp: , Rfl:  .  hydrochlorothiazide (HYDRODIURIL) 25 MG tablet, Take 25 mg by mouth daily., Disp: , Rfl:  .  hydrOXYzine (ATARAX/VISTARIL) 25 MG tablet, Take 25 mg by mouth 3 (three) times daily., Disp: , Rfl:  .  ibuprofen (ADVIL) 200 MG tablet, Take 800 mg by mouth every 6 (six) hours as needed., Disp: , Rfl:  .  venlafaxine XR (EFFEXOR-XR) 37.5 MG 24 hr capsule, Take 37.5 mg by mouth daily., Disp: , Rfl:  .  VITAMIN D PO, Take 2,000 Units by mouth daily., Disp: , Rfl:  .  Levonorgestrel-Ethinyl Estradiol (DAYSEE) 0.15-0.03 &0.01 MG tablet, Take 1 tablet by mouth daily., Disp: 91 tablet, Rfl: 3  Objective Blood pressure (!) 132/94, pulse 85, height 5\' 5"  (1.651 m), weight 131 lb (59.4 kg), last menstrual period 08/25/2020.  General WDWN female NAD Vulva:  normal appearing vulva with no masses, tenderness or lesions Vagina:  normal mucosa, no discharge Cervix:  Normal no lesions    Pertinent ROS No burning with urination, frequency or urgency No nausea, vomiting or diarrhea Nor fever chills or other constitutional symptoms   Labs or studies No new labs    Impression Diagnoses this Encounter::   ICD-10-CM   1. Encounter for gynecological examination with Papanicolaou smear of cervix  Z01.419 Cytology - PAP( [place])  2. S/P laser ablation of the cervix 04/02/20 for high grade cervical dysplasia  Z98.890   3. Encounter for surveillance of contraceptive pills  Z30.41    Continue seasonique    Established relevant diagnosis(es):   Plan/Recommendations: Meds ordered this encounter  Medications  . Levonorgestrel-Ethinyl Estradiol (DAYSEE) 0.15-0.03 &0.01 MG  tablet    Sig: Take 1 tablet by mouth daily.    Dispense:  91 tablet    Refill:  3    Labs or Scans Ordered: No orders of the defined types were placed in this encounter.   Management:: Will notify patient of the results of her Pap/HPV testing Continue q6 months  for 2 full years post procedure  Follow up Return in about 6 months (around 04/18/2021) for Follow up HPV based cytology.        All questions were answered.

## 2020-10-24 LAB — CYTOLOGY - PAP
Adequacy: ABSENT
Chlamydia: NEGATIVE
Comment: NEGATIVE
Comment: NEGATIVE
Comment: NEGATIVE
Comment: NORMAL
Diagnosis: NEGATIVE
Diagnosis: REACTIVE
HPV 16: NEGATIVE
HPV 18 / 45: NEGATIVE
High risk HPV: POSITIVE — AB
Neisseria Gonorrhea: NEGATIVE

## 2020-10-28 ENCOUNTER — Other Ambulatory Visit: Payer: Self-pay | Admitting: Obstetrics & Gynecology

## 2020-10-28 ENCOUNTER — Other Ambulatory Visit: Payer: Self-pay

## 2020-10-28 ENCOUNTER — Telehealth: Payer: Self-pay | Admitting: Adult Health

## 2020-10-28 MED ORDER — METRONIDAZOLE 0.75 % VA GEL: VAGINAL | 0 refills | Status: DC

## 2020-10-28 NOTE — Telephone Encounter (Signed)
Pt states she's having watery discharge like when she gets BV States she saw something about BV her PAP results  Would like Metrogel called in, states she's unable to tolerate oral meds    Please advise    [place]

## 2021-07-13 ENCOUNTER — Other Ambulatory Visit (HOSPITAL_COMMUNITY)
Admission: RE | Admit: 2021-07-13 | Discharge: 2021-07-13 | Disposition: A | Discharge status: Home / Self Care | Payer: No Typology Code available for payment source | Source: Ambulatory Visit | Attending: Obstetrics & Gynecology | Admitting: Obstetrics & Gynecology

## 2021-07-13 ENCOUNTER — Ambulatory Visit (INDEPENDENT_AMBULATORY_CARE_PROVIDER_SITE_OTHER): Payer: No Typology Code available for payment source | Admitting: Obstetrics & Gynecology

## 2021-07-13 ENCOUNTER — Encounter: Payer: Self-pay | Admitting: Obstetrics & Gynecology

## 2021-07-13 ENCOUNTER — Other Ambulatory Visit: Payer: Self-pay

## 2021-07-13 VITALS — BP 158/82 | HR 73 | Ht 66.0 in | Wt 140.0 lb

## 2021-07-13 DIAGNOSIS — Z9889 Other specified postprocedural states: Secondary | ICD-10-CM | POA: Diagnosis not present

## 2021-07-13 NOTE — Progress Notes (Signed)
Chief Complaint  Patient presents with   Gynecologic Exam      30 y.o. [phone] Patient's last menstrual period was 06/08/2021 (approximate). The current method of family planning is OCP (estrogen/progesterone).  Outpatient Encounter Medications as of 07/13/2021  Medication Sig   amphetamine-dextroamphetamine (ADDERALL) 10 MG tablet Take 10 mg by mouth 2 (two) times daily with a meal.   clobetasol cream (TEMOVATE) 0.05 % Apply 1 application topically daily as needed (rash).   hydrochlorothiazide (HYDRODIURIL) 25 MG tablet Take 25 mg by mouth daily.   hydrOXYzine (ATARAX/VISTARIL) 25 MG tablet Take 25 mg by mouth 3 (three) times daily.   ibuprofen (ADVIL) 200 MG tablet Take 800 mg by mouth every 6 (six) hours as needed.   Levonorgestrel-Ethinyl Estradiol (DAYSEE) 0.15-0.03 &0.01 MG tablet Take 1 tablet by mouth daily.   venlafaxine XR (EFFEXOR-XR) 37.5 MG 24 hr capsule Take 37.5 mg by mouth daily.   VITAMIN D PO Take 2,000 Units by mouth daily.   Vitamin D, Ergocalciferol, (DRISDOL) 1.25 MG (50000 UNIT) CAPS capsule Take by mouth.   [DISCONTINUED] metroNIDAZOLE (METROGEL VAGINAL) 0.75 % vaginal gel Nightly x 5 nights   No facility-administered encounter medications on file as of 07/13/2021.    Subjective Pt is in today for ongoing surveillance of her history of hig grade cervical dysplasia She is s/p laser ablation of the cervix 8/21 This is her second follow up Pap/HPV testing   Past Medical History:  Diagnosis Date   Anxiety    BV (bacterial vaginosis) 12/17/2013   Depression    Hypertension    Ovarian cyst    Vaginal discharge 12/17/2013    Past Surgical History:  Procedure Laterality Date   CERVICAL ABLATION N/A 04/02/2020   Procedure: LASER ABLATION OF CERVIX;  Surgeon: Lazaro Arms, MD;  Location: [facility];  Service: Gynecology;  Laterality: N/A;   CHOLECYSTECTOMY     WISDOM TOOTH EXTRACTION      OB History     Gravida  4   Para  3   Term  3   Preterm   0   AB  1   Living  3      SAB  1   IAB  0   Ectopic  0   Multiple  0   Live Births  3           No Known Allergies  Social History   Socioeconomic History   Marital status: Single    Spouse name: Not on file   Number of children: Not on file   Years of education: Not on file   Highest education level: Not on file  Occupational History   Not on file  Tobacco Use   Smoking status: Former    Packs/day: 0.50    Years: 10.00    Pack years: 5.00    Types: Cigarettes   Smokeless tobacco: Never  Vaping Use   Vaping Use: Every day  Substance and Sexual Activity   Alcohol use: Yes    Alcohol/week: 0.0 standard drinks    Comment: weekends   Drug use: No   Sexual activity: Yes    Birth control/protection: Pill  Other Topics Concern   Not on file  Social History Narrative   Not on file   Social Determinants of Health   Financial Resource Strain: Not on file  Food Insecurity: Not on file  Transportation Needs: Not on file  Physical Activity: Not on file  Stress: Not  on file  Social Connections: Not on file    Family History  Problem Relation Age of Onset   Cancer Maternal Grandmother        ovarian, uterine   COPD Maternal Grandfather    Emphysema Maternal Grandfather    Hypertension Father    Cancer Father        prostate   Depression Father    Anxiety disorder Father    Cirrhosis Father    COPD Father    Kidney disease Father    Diabetes Mother    Thyroid disease Mother        had thyroid removed   Hypertension Mother    Heart attack Mother    ADD / ADHD Daughter    Diabetes Maternal Aunt    Diabetes Maternal Aunt    Diabetes Maternal Aunt    Diabetes Maternal Uncle    Diabetes Maternal Uncle    Diabetes Maternal Uncle    Diabetes Maternal Uncle    Diabetes Maternal Uncle    Diabetes Maternal Uncle    Diabetes Maternal Uncle    Asthma Paternal Aunt     Medications:       Current Outpatient Medications:     amphetamine-dextroamphetamine (ADDERALL) 10 MG tablet, Take 10 mg by mouth 2 (two) times daily with a meal., Disp: , Rfl:    clobetasol cream (TEMOVATE) 0.05 %, Apply 1 application topically daily as needed (rash)., Disp: , Rfl:    hydrochlorothiazide (HYDRODIURIL) 25 MG tablet, Take 25 mg by mouth daily., Disp: , Rfl:    hydrOXYzine (ATARAX/VISTARIL) 25 MG tablet, Take 25 mg by mouth 3 (three) times daily., Disp: , Rfl:    ibuprofen (ADVIL) 200 MG tablet, Take 800 mg by mouth every 6 (six) hours as needed., Disp: , Rfl:    Levonorgestrel-Ethinyl Estradiol (DAYSEE) 0.15-0.03 &0.01 MG tablet, Take 1 tablet by mouth daily., Disp: 91 tablet, Rfl: 3   venlafaxine XR (EFFEXOR-XR) 37.5 MG 24 hr capsule, Take 37.5 mg by mouth daily., Disp: , Rfl:    VITAMIN D PO, Take 2,000 Units by mouth daily., Disp: , Rfl:    Vitamin D, Ergocalciferol, (DRISDOL) 1.25 MG (50000 UNIT) CAPS capsule, Take by mouth., Disp: , Rfl:   Objective Blood pressure (!) 158/82, pulse 73, height 5\' 6"  (1.676 m), weight 140 lb (63.5 kg), last menstrual period 06/08/2021.  General WDWN female NAD Vulva:  normal appearing vulva with no masses, tenderness or lesions Vagina:  normal mucosa, no discharge Cervix:  Normal no lesions    Pertinent ROS No burning with urination, frequency or urgency No nausea, vomiting or diarrhea Nor fever chills or other constitutional symptoms   Labs or studies No new labs    Impression Diagnoses this Encounter::   ICD-10-CM   1. History of conization of cervix for HSIL  Z98.890 Cytology - PAP   8/21        Established relevant diagnosis(es):   Plan/Recommendations: No orders of the defined types were placed in this encounter.   Labs or Scans Ordered: No orders of the defined types were placed in this encounter.   Management:: Will notify patient of the results of her Pap/HPV testing Continue q6 months for 2 full years post procedure  Follow up Return in about 9 months  (around 03/31/2022) for Final cervical cytology.        All questions were answered.

## 2021-07-16 LAB — CYTOLOGY - PAP: Diagnosis: NEGATIVE

## 2021-09-11 ENCOUNTER — Other Ambulatory Visit: Payer: Self-pay | Admitting: Obstetrics & Gynecology

## 2022-02-02 ENCOUNTER — Encounter: Payer: Self-pay | Admitting: Obstetrics & Gynecology

## 2022-02-02 ENCOUNTER — Ambulatory Visit (INDEPENDENT_AMBULATORY_CARE_PROVIDER_SITE_OTHER): Payer: No Typology Code available for payment source | Admitting: Obstetrics & Gynecology

## 2022-02-02 VITALS — BP 133/88 | HR 106 | Ht 66.0 in | Wt 151.0 lb

## 2022-02-02 DIAGNOSIS — N8311 Corpus luteum cyst of right ovary: Secondary | ICD-10-CM

## 2022-04-20 ENCOUNTER — Emergency Department (HOSPITAL_COMMUNITY): Payer: No Typology Code available for payment source

## 2022-04-20 ENCOUNTER — Other Ambulatory Visit: Payer: Self-pay

## 2022-04-20 ENCOUNTER — Emergency Department (HOSPITAL_COMMUNITY)
Admission: EM | Admit: 2022-04-20 | Discharge: 2022-04-20 | Disposition: A | Discharge status: Home / Self Care | Payer: No Typology Code available for payment source | Attending: Emergency Medicine | Admitting: Emergency Medicine

## 2022-04-20 ENCOUNTER — Encounter (HOSPITAL_COMMUNITY): Payer: Self-pay | Admitting: *Deleted

## 2022-04-20 DIAGNOSIS — R200 Anesthesia of skin: Secondary | ICD-10-CM | POA: Diagnosis present

## 2022-04-20 DIAGNOSIS — Z20822 Contact with and (suspected) exposure to covid-19: Secondary | ICD-10-CM | POA: Diagnosis not present

## 2022-04-20 DIAGNOSIS — I10 Essential (primary) hypertension: Secondary | ICD-10-CM | POA: Insufficient documentation

## 2022-04-20 DIAGNOSIS — Z79899 Other long term (current) drug therapy: Secondary | ICD-10-CM | POA: Insufficient documentation

## 2022-04-20 DIAGNOSIS — R202 Paresthesia of skin: Secondary | ICD-10-CM

## 2022-04-20 LAB — DIFFERENTIAL
Abs Immature Granulocytes: 0 10*3/uL (ref 0.00–0.07)
Basophils Absolute: 0.1 10*3/uL (ref 0.0–0.1)
Basophils Relative: 2 %
Eosinophils Absolute: 0.1 10*3/uL (ref 0.0–0.5)
Eosinophils Relative: 3 %
Immature Granulocytes: 0 %
Lymphocytes Relative: 43 %
Lymphs Abs: 1.9 10*3/uL (ref 0.7–4.0)
Monocytes Absolute: 0.4 10*3/uL (ref 0.1–1.0)
Monocytes Relative: 9 %
Neutro Abs: 1.8 10*3/uL (ref 1.7–7.7)
Neutrophils Relative %: 43 %

## 2022-04-20 LAB — COMPREHENSIVE METABOLIC PANEL
ALT: 16 U/L (ref 0–44)
AST: 22 U/L (ref 15–41)
Albumin: 3.9 g/dL (ref 3.5–5.0)
Alkaline Phosphatase: 65 U/L (ref 38–126)
Anion gap: 9 (ref 5–15)
BUN: 11 mg/dL (ref 6–20)
CO2: 24 mmol/L (ref 22–32)
Calcium: 8.8 mg/dL — ABNORMAL LOW (ref 8.9–10.3)
Chloride: 103 mmol/L (ref 98–111)
Creatinine, Ser: 0.79 mg/dL (ref 0.44–1.00)
GFR, Estimated: >60 mL/min (ref >60)
Glucose, Bld: 116 mg/dL — ABNORMAL HIGH (ref 70–99)
Potassium: 3.4 mmol/L — ABNORMAL LOW (ref 3.5–5.1)
Sodium: 136 mmol/L (ref 135–145)
Total Bilirubin: 0.5 mg/dL (ref 0.3–1.2)
Total Protein: 8 g/dL (ref 6.5–8.1)

## 2022-04-20 LAB — URINALYSIS, ROUTINE W REFLEX MICROSCOPIC
Bilirubin Urine: NEGATIVE
Glucose, UA: NEGATIVE mg/dL
Hgb urine dipstick: NEGATIVE
Ketones, ur: NEGATIVE mg/dL
Leukocytes,Ua: NEGATIVE
Nitrite: NEGATIVE
Protein, ur: NEGATIVE mg/dL
Specific Gravity, Urine: >1.046 — ABNORMAL HIGH (ref 1.005–1.030)
pH: 7 (ref 5.0–8.0)

## 2022-04-20 LAB — CBC
HCT: 38.1 % (ref 36.0–46.0)
Hemoglobin: 12.5 g/dL (ref 12.0–15.0)
MCH: 27.4 pg (ref 26.0–34.0)
MCHC: 32.8 g/dL (ref 30.0–36.0)
MCV: 83.4 fL (ref 80.0–100.0)
Platelets: 259 10*3/uL (ref 150–400)
RBC: 4.57 MIL/uL (ref 3.87–5.11)
RDW: 13.4 % (ref 11.5–15.5)
WBC: 4.3 10*3/uL (ref 4.0–10.5)
nRBC: 0 % (ref 0.0–0.2)

## 2022-04-20 LAB — RESP PANEL BY RT-PCR (FLU A&B, COVID) ARPGX2
Influenza A by PCR: NEGATIVE
Influenza B by PCR: NEGATIVE
SARS Coronavirus 2 by RT PCR: NEGATIVE

## 2022-04-20 LAB — RAPID URINE DRUG SCREEN, HOSP PERFORMED
Amphetamines: POSITIVE — AB
Barbiturates: NOT DETECTED
Benzodiazepines: NOT DETECTED
Cocaine: NOT DETECTED
Opiates: NOT DETECTED
Tetrahydrocannabinol: NOT DETECTED

## 2022-04-20 LAB — CBG MONITORING, ED: Glucose-Capillary: 95 mg/dL (ref 70–99)

## 2022-04-20 LAB — PROTIME-INR
INR: 1 (ref 0.8–1.2)
Prothrombin Time: 13.1 seconds (ref 11.4–15.2)

## 2022-04-20 LAB — ETHANOL: Alcohol, Ethyl (B): <10 mg/dL (ref <10)

## 2022-04-20 LAB — APTT: aPTT: 27 seconds (ref 24–36)

## 2022-04-20 MED ORDER — SODIUM CHLORIDE 0.9% FLUSH
3.0000 mL | Freq: Once | INTRAVENOUS | Status: AC
  Administered 2022-04-20: 3 mL via INTRAVENOUS

## 2022-04-20 MED ORDER — GADOBUTROL 1 MMOL/ML IV SOLN
6.5000 mL | Freq: Once | INTRAVENOUS | Status: AC | PRN
  Administered 2022-04-20: 6.5 mL via INTRAVENOUS

## 2022-04-20 MED ORDER — ACETAMINOPHEN 500 MG PO TABS
1000.0000 mg | ORAL_TABLET | Freq: Once | ORAL | Status: AC
  Administered 2022-04-20: 1000 mg via ORAL
  Filled 2022-04-20: qty 2

## 2022-04-20 MED ORDER — LACTATED RINGERS IV BOLUS
1000.0000 mL | Freq: Once | INTRAVENOUS | Status: AC
  Administered 2022-04-20: 1000 mL via INTRAVENOUS

## 2022-04-20 MED ORDER — DIPHENHYDRAMINE HCL 50 MG/ML IJ SOLN
25.0000 mg | Freq: Once | INTRAMUSCULAR | Status: AC
  Administered 2022-04-20: 25 mg via INTRAVENOUS
  Filled 2022-04-20: qty 1

## 2022-04-20 MED ORDER — KETOROLAC TROMETHAMINE 15 MG/ML IJ SOLN
15.0000 mg | Freq: Once | INTRAMUSCULAR | Status: AC
  Administered 2022-04-20: 15 mg via INTRAVENOUS
  Filled 2022-04-20: qty 1

## 2022-04-20 MED ORDER — PROCHLORPERAZINE EDISYLATE 10 MG/2ML IJ SOLN
10.0000 mg | Freq: Once | INTRAMUSCULAR | Status: AC
  Administered 2022-04-20: 10 mg via INTRAVENOUS
  Filled 2022-04-20: qty 2

## 2022-04-20 MED ORDER — GABAPENTIN 300 MG PO CAPS: 300.0000 mg | ORAL_CAPSULE | Freq: Three times a day (TID) | ORAL | 0 refills | Status: DC

## 2022-04-20 MED ORDER — IOHEXOL 350 MG/ML SOLN
75.0000 mL | Freq: Once | INTRAVENOUS | Status: AC | PRN
  Administered 2022-04-20: 75 mL via INTRAVENOUS

## 2022-04-20 NOTE — ED Notes (Signed)
Patient transported to CT 

## 2022-04-20 NOTE — ED Notes (Signed)
Pt is aware we need a urine sample and will let us know when she can provide a sample.

## 2022-04-20 NOTE — ED Notes (Signed)
Patients preg test is resulted NEG.

## 2022-04-20 NOTE — Consult Note (Signed)
[facility] Telemedicine Consult   Requesting Provider: Vivi Barrack Consult Participants: Patient, bedside nurse, telestroke  This consult was provided via telemedicine with 2-way video and audio communication. The patient/family was informed that care would be provided in this way and agreed to receive care in this manner.    Chief Complaint: Right facial numbness  HPI: 31 year old female who awoke at 6:05 AM with some right facial numbness and tingling.  She states that what woke her up as a pressure sensation behind her eyes, but not something she would really: "Headache."  She then noticed her right face felt tingly, and she thought "maybe I just slept on it wrong" but when it did not get better she decided to seek care in the emergency department.  She denies any history of migraines, or headaches that make her want a lay down in a dark room.  She denies any family history of migraines.   LKW: 9:30 PM Thrombolytics given?: No, outside of window IR Thrombectomy? No, no LVO signs Modified Rankin Scale: 0-Completely asymptomatic and back to baseline post- stroke Neurologist logged on: 0838 Time of teleneurologist evaluation(pt returned to room): 0845  Exam: Vitals:   04/20/22 0815 04/20/22 0830  BP: (!) 157/90 (!) 146/56  Pulse: (!) 126 (!) 118  Resp: 20 (!) 27  Temp: 98.3 F (36.8 C)   SpO2: 100% 100%    General: In bed, NAD  1A: Level of Consciousness - 0 1B: Ask Month and Age - 0 1C: 'Blink Eyes' & 'Squeeze Hands' - 0 2: Test Horizontal Extraocular Movements - 0 3: Test Visual Fields - 0 4: Test Facial Palsy - 0 5A: Test Left Arm Motor Drift - 0 5B: Test Right Arm Motor Drift - 0 6A: Test Left Leg Motor Drift - 0 6B: Test Right Leg Motor Drift - 0 7: Test Limb Ataxia - 0 8: Test Sensation - 1 9: Test Language/Aphasia- 0 10: Test Dysarthria - 0 11: Test Extinction/Inattention - 0 NIHSS score: One   Imaging Reviewed: CT/CTA-negative  Labs  reviewed in epic and pertinent values follow: Borderline calcium and potassium, otherwise unremarkable CMP   Assessment: 31 year old female with right facial paresthesias and retrobulbar pressure sensation.  Given her age, I think the most likely culprit here is going to be complicated migraine, but given her lack of history of migraines or family history of migraines I do think further evaluation with MRI is warranted.  If MRI is negative, then I would treat this as complicated migraine.  Recommendations: 1) MRI brain 2) if negative would treat as complicated migraine    Ritta Slot, MD [facility] [phone]  If 7pm- 7am, please page neurology on call as listed in AMION.

## 2022-04-20 NOTE — Discharge Instructions (Addendum)
Thank you for coming to [facility] Emergency Department. You were seen for right facial numbness. We did an exam, labs, and imaging including an MRI, and these showed no acute findings.  Please follow up with your primary care provider within 1 week.  We have provided you with a prescription for gabapentin 3 to milligrams 3 times daily to take for 3 days.  We have also provided you with an ambulatory referral to neurology, who will call you to make a follow-up appointment.  Do not hesitate to return to the ED or call 911 if you experience: -Worsening symptoms -Lightheadedness, passing out -Fevers/chills -Anything else that concerns you

## 2022-04-20 NOTE — ED Triage Notes (Signed)
Pt states she went to bed around 2030 last night and woke up this am around 6 with right side facial numbness  Pt denies any blurred vision, weakness or pain

## 2022-04-20 NOTE — ED Notes (Signed)
Called CT and beeped out CODE STROKE @ [phone]

## 2022-04-20 NOTE — ED Provider Notes (Signed)
[facility] EMERGENCY DEPARTMENT Provider Note   CSN: [id number] Arrival date & time: 04/20/22  7829     History {Add pertinent medical, surgical, social history, OB history to HPI:1} Chief Complaint  Patient presents with  . Numbness    Carrie Mckenzie is a 31 y.o. female with anxiety, left ovarian cyst, tobacco use, history of postpartum hypertension presents with right facial numbness.   LKN 2100 PM last night. Woke up at 0605 AM with R facial numbess. No h/o similar, no h/o CVA. Has HTN, takes HCTZ. No blood thinners. Tried to ignore it all AM but it kept worsening. Patient is ED RN in Person. Extends from scalp down to lower R neck. No pain, double vision, blurry vision, loss of vision, facial palsy, difficulty speaking, asymmetric weakness.   Stroke code called on arrival  HPI     Home Medications Prior to Admission medications   Medication Sig Start Date End Date Taking? Authorizing Provider  amphetamine-dextroamphetamine (ADDERALL) 10 MG tablet Take 10 mg by mouth 2 (two) times daily with a meal. 20 mg in the am and 10 mg at lunch    [provider]  clobetasol cream (TEMOVATE) 0.05 % Apply 1 application topically daily as needed (rash).    [provider]  hydrochlorothiazide (HYDRODIURIL) 25 MG tablet Take 25 mg by mouth daily. 10/13/20   [provider]  hydrOXYzine (ATARAX/VISTARIL) 25 MG tablet Take 25 mg by mouth 3 (three) times daily. 09/15/20   [provider]  ibuprofen (ADVIL) 200 MG tablet Take 800 mg by mouth every 6 (six) hours as needed.    [provider]  venlafaxine (EFFEXOR) 75 MG tablet Take 75 mg by mouth 2 (two) times daily.    [provider]  VITAMIN D PO Take 2,000 Units by mouth daily.    [provider]  Vitamin D, Ergocalciferol, (DRISDOL) 1.25 MG (50000 UNIT) CAPS capsule Take by mouth. Patient not taking: Reported on 02/02/2022 06/25/21   [provider]      Allergies     Patient has no known allergies.    Review of Systems   Review of Systems Review of systems positive/negative for ***.  A 10 point review of systems was performed and is negative unless otherwise reported in HPI.  Physical Exam Updated Vital Signs Ht 5\' 6"  (1.676 m)   Wt 65.8 kg   LMP 04/02/2022 (Approximate)   BMI 23.40 kg/m  Physical Exam General: Normal appearing ***female/female, lying in bed.  HEENT: PERRLA, Sclera anicteric, MMM, trachea midline. Cardiology: RRR, no murmurs/rubs/gallops. BL radial and DP pulses equal bilaterally.  Resp: Normal respiratory rate and effort. CTAB, no wheezes, rhonchi, crackles.  Abd: Soft, non-tender, non-distended. No rebound tenderness or guarding.  GU: Deferred. MSK: No peripheral edema or signs of trauma. Extremities without deformity or TTP. No cyanosis or clubbing. Skin: warm, dry. No rashes or lesions. Back: No CVA tenderness Neuro: A&Ox4, CNs II-XII grossly intact. MAEs. Sensation grossly intact.  Psych: Normal mood and affect.   ED Results / Procedures / Treatments   Labs (all labs ordered are listed, but only abnormal results are displayed) Labs Reviewed - No data to display  EKG None  Radiology No results found.  Procedures Procedures  {Document cardiac monitor, telemetry assessment procedure when appropriate:1}  Medications Ordered in ED Medications - No data to display  ED Course/ Medical Decision Making/ A&P  Medical Decision Making Amount and/or Complexity of Data Reviewed Labs: ordered. Decision-making details documented in ED Course. Radiology: ordered. Decision-making details documented in ED Course.  Risk OTC drugs. Prescription drug management.    @HNNMDM @ ***  I have personally reviewed and interpreted all labs and imaging.   Clinical Course as of 04/23/22 1459  Tue Apr 20, 2022  0902 Glucose-Capillary: 95 [HN]  Apr 22, 2022 Labs unremarkable in context of presentation.  [HN]   0903 CTH and CTA H&N negative for acute findings. D/w telestroke neurologist who states she is most likely having a complicated migraine, but does recommend obtaining the MRI.  [HN]  1022 MR Brain W and Wo Contrast Partially empty sella again noted.  Otherwise normal brain MRI. [HN]    Clinical Course User Index [HN] 0904, MD    {Document critical care time when appropriate:1} {Document review of labs and clinical decision tools ie heart score, Chads2Vasc2 etc:1}  {Document your independent review of radiology images, and any outside records:1} {Document your discussion with family members, caretakers, and with consultants:1} {Document social determinants of health affecting pt's care:1} {Document your decision making why or why not admission, treatments were needed:1} Final Clinical Impression(s) / ED Diagnoses Final diagnoses:  None    Rx / DC Orders ED Discharge Orders     None        This note was created using dictation software, which may contain spelling or grammatical errors.

## 2022-04-20 NOTE — ED Notes (Signed)
Discharged to home with family.

## 2022-04-20 NOTE — ED Notes (Signed)
Notified EDP that patient passed her swallow test and medications.EDP at bedside

## 2022-04-20 NOTE — ED Notes (Signed)
Patient transported to MRI 

## 2022-04-20 NOTE — Progress Notes (Addendum)
Telestroke RN Note  [place] [phone] Pt to CT 0830 - EDP at bedside Neuro paged [phone] Dr. Amada Jupiter on cart [phone] Pt back from CT 0845 mRS 0 LNW 9/11 2100 R sided facial numbness noticed upon waking at 0605 today

## 2022-05-07 ENCOUNTER — Encounter: Payer: Self-pay | Admitting: *Deleted

## 2022-05-07 ENCOUNTER — Ambulatory Visit (INDEPENDENT_AMBULATORY_CARE_PROVIDER_SITE_OTHER): Payer: No Typology Code available for payment source | Admitting: *Deleted

## 2022-05-07 VITALS — BP 133/87 | HR 89 | Ht 66.0 in | Wt 153.0 lb

## 2022-05-07 DIAGNOSIS — Z3201 Encounter for pregnancy test, result positive: Secondary | ICD-10-CM

## 2022-05-07 LAB — POCT URINE PREGNANCY: Preg Test, Ur: POSITIVE — AB

## 2022-05-07 NOTE — Progress Notes (Signed)
   NURSE VISIT- PREGNANCY CONFIRMATION   SUBJECTIVE:  Carrie Mckenzie is a 31 y.o. [phone] female at [redacted]w[redacted]d by certain LMP of Patient's last menstrual period was 04/03/2022 (approximate). Here for pregnancy confirmation.  Home pregnancy test: positive x 1.   She reports nausea.  She is not taking prenatal vitamins.  Pt was advised to reach out to whoever prescribed the Adderall and let them know she was pregnant. Pt voiced understanding.   OBJECTIVE:  BP 133/87 (BP Location: Left Arm, Patient Position: Sitting, Cuff Size: Normal)   Pulse 89   Ht 5\' 6"  (1.676 m)   Wt 153 lb (69.4 kg)   LMP 04/03/2022 (Approximate)   BMI 24.69 kg/m   Appears well, in no apparent distress  No results found for this or any previous visit (from the past 24 hour(s)).  ASSESSMENT: Positive pregnancy test, [redacted]w[redacted]d by LMP    PLAN: Schedule for dating ultrasound in 4 weeks Prenatal vitamins: plans to begin OTC ASAP   Nausea medicines: not currently needed   OB packet given: Yes  Levy Pupa  05/07/2022 10:39 AM

## 2022-05-28 ENCOUNTER — Encounter: Payer: Self-pay | Admitting: Obstetrics & Gynecology

## 2022-05-28 ENCOUNTER — Other Ambulatory Visit: Payer: Self-pay | Admitting: Adult Health

## 2022-05-28 MED ORDER — PROMETHAZINE HCL 25 MG PO TABS: 25.0000 mg | ORAL_TABLET | Freq: Four times a day (QID) | ORAL | 1 refills | Status: DC | PRN

## 2022-05-28 NOTE — Progress Notes (Signed)
Rx sent for phenergan.

## 2022-06-03 ENCOUNTER — Encounter: Payer: Self-pay | Admitting: *Deleted

## 2022-06-03 ENCOUNTER — Other Ambulatory Visit: Payer: Self-pay | Admitting: Obstetrics & Gynecology

## 2022-06-03 DIAGNOSIS — I10 Essential (primary) hypertension: Secondary | ICD-10-CM | POA: Insufficient documentation

## 2022-06-03 DIAGNOSIS — O0990 Supervision of high risk pregnancy, unspecified, unspecified trimester: Secondary | ICD-10-CM | POA: Insufficient documentation

## 2022-06-03 DIAGNOSIS — O3680X0 Pregnancy with inconclusive fetal viability, not applicable or unspecified: Secondary | ICD-10-CM

## 2022-06-04 ENCOUNTER — Ambulatory Visit (INDEPENDENT_AMBULATORY_CARE_PROVIDER_SITE_OTHER): Payer: No Typology Code available for payment source

## 2022-06-04 ENCOUNTER — Ambulatory Visit (INDEPENDENT_AMBULATORY_CARE_PROVIDER_SITE_OTHER): Payer: No Typology Code available for payment source | Admitting: *Deleted

## 2022-06-04 ENCOUNTER — Encounter: Payer: Self-pay | Admitting: *Deleted

## 2022-06-04 VITALS — BP 129/82 | HR 87 | Wt 154.0 lb

## 2022-06-04 DIAGNOSIS — O3680X0 Pregnancy with inconclusive fetal viability, not applicable or unspecified: Secondary | ICD-10-CM

## 2022-06-04 DIAGNOSIS — O0991 Supervision of high risk pregnancy, unspecified, first trimester: Secondary | ICD-10-CM

## 2022-06-04 NOTE — Progress Notes (Signed)
   INITIAL OB NURSE INTAKE  SUBJECTIVE:  Carrie Mckenzie is a 31 y.o. [phone] female [redacted]w[redacted]d by LMP c/w today's U/S with an Estimated Date of Delivery: 01/08/23 being seen today for her initial OB intake/educational visit with RN. She is not taking prenatal vitamins.  She is having nausea and vomiting and is taking  nausea meds at this time.  Patient's last menstrual period was 04/03/2022 (approximate).  Patient's medical, surgical, and obstetrical history obtained and reviewed.  Current medications reviewed.   Patient Active Problem List   Diagnosis Date Noted   Essential (primary) hypertension 06/03/2022   Supervision of high-risk pregnancy 06/03/2022   Left ovarian cyst 12/16/2016   Smoker 12/16/2016   History of postpartum hypertension 05/13/2014   Rubella non-immune status, antepartum 08/28/2013   Anxiety 08/27/2013   Past Medical History:  Diagnosis Date   Anxiety    BV (bacterial vaginosis) 12/17/2013   Depression    Hypertension    Ovarian cyst    Vaginal discharge 12/17/2013   Past Surgical History:  Procedure Laterality Date   CERVICAL ABLATION N/A 04/02/2020   Procedure: LASER ABLATION OF CERVIX;  Surgeon: Florian Buff, MD;  Location: [facility];  Service: Gynecology;  Laterality: N/A;   CHOLECYSTECTOMY     WISDOM TOOTH EXTRACTION     OB History     Gravida  5   Para  3   Term  3   Preterm  0   AB  1   Living  3      SAB  1   IAB  0   Ectopic  0   Multiple  0   Live Births  3           OBJECTIVE:  BP 129/82   Pulse 87   Wt 154 lb (69.9 kg)   LMP 04/03/2022 (Approximate)   BMI 24.86 kg/m   ASSESSMENT/PLAN: [id number] at [redacted]w[redacted]d with an Estimated Date of Delivery: 01/08/23  Prenatal vitamins: plans to begin OTC ASAP   Nausea medicines: not currently needed   OB packet given: Yes BabyScripts and MyChart activated  Blood Pressure Cuff: has at home. Discussed to be used for virtual visits and home BP checks.  Genetic & carrier screening  discussed: requests Panorama and NT/IT, declines Horizon  Placed OB Box on problem list and updated Reviewed recommended weight gain based on pre-gravid BMI BMI 18.5-24.9, gain max 25-35lb  Follow-up in 3 weeks for NT U/S & New OB visit with provider  Face-to-face time at least 30 minutes. 50% or more of this visit was spent in counseling and coordination of care.  Kristeen Miss Solly Derasmo RN-C 06/04/2022 1:14 PM

## 2022-06-04 NOTE — Progress Notes (Signed)
[place] 8+6 wks,single IUP with yolk sac,FHR 176 bpm,CRL 23.3 mm,normal ovaries

## 2022-06-16 ENCOUNTER — Encounter: Payer: Self-pay | Admitting: Neurology

## 2022-06-16 ENCOUNTER — Ambulatory Visit (INDEPENDENT_AMBULATORY_CARE_PROVIDER_SITE_OTHER): Payer: No Typology Code available for payment source | Admitting: Neurology

## 2022-06-16 VITALS — BP 134/82 | HR 96 | Ht 66.0 in | Wt 152.5 lb

## 2022-06-16 DIAGNOSIS — R202 Paresthesia of skin: Secondary | ICD-10-CM | POA: Diagnosis not present

## 2022-06-16 NOTE — Progress Notes (Signed)
Chief Complaint  Patient presents with   New Patient (Initial Visit)    Rm 14. Accompanied by boyfriend and kids. NP internal referral for right facial numbness.      ASSESSMENT AND PLAN  Carrie Mckenzie is a 31 y.o. female   Right face numbness   Essentially normal neurological examination, normal MRI of the brain  This happened in the setting of depression anxiety, not medication treatment candidate due to pregnancy, was on Effexor in the past,  Continue observe her symptoms, return to clinic for new issues    DIAGNOSTIC DATA (LABS, IMAGING, TESTING) - I reviewed patient records, labs, notes, testing and imaging myself where available.   MEDICAL HISTORY:  Carrie Mckenzie is a 31 year old female, accompanied by her husband and children for evaluation of right facial pain on June 16, 2022, seen in request by Dr. Loetta Rough,    I reviewed and summarized the referring note. PMHX. ADHD Depression, anxiety  She is currently [redacted] weeks pregnant, due January 07, 2022, since September 2023 she noticed intermittent right facial numbness, it can last hours, 2 days, occasionally spreading to right arm, she denied persistent sensory loss, no weakness, no dysarthria no lower extremity involvement  For that reason she presented to emergency room April 20, 2022,  I personally reviewed MRI of brain with and without contrast, partially empty sella otherwise normal MRI of the brain  CT angiogram of head and neck showed no large vessel disease     PHYSICAL EXAM:   Vitals:   06/16/22 0955  BP: 134/82  Pulse: 96  Weight: 152 lb 8 oz (69.2 kg)  Height: 5\' 6"  (1.676 m)   Not recorded     Body mass index is 24.61 kg/m.  PHYSICAL EXAMNIATION:  Gen: NAD, conversant, well nourised, well groomed                     Cardiovascular: Regular rate rhythm, no peripheral edema, warm, nontender. Eyes: Conjunctivae clear without exudates or hemorrhage Neck: Supple, no carotid  bruits. Pulmonary: Clear to auscultation bilaterally   NEUROLOGICAL EXAM:  MENTAL STATUS: Speech/cognition: Awake, alert, oriented to history taking and casual conversation CRANIAL NERVES: CN II: Visual fields are full to confrontation. Pupils are round equal and briskly reactive to light.  Funduscopy examination showed sharp disc bilaterally CN III, IV, VI: extraocular movement are normal. No ptosis. CN V: Facial sensation is intact to light touch, corneal reflex were present and symmetric bilaterally. CN VII: Face is symmetric with normal eye closure  CN VIII: Hearing is normal to causal conversation. CN IX, X: Phonation is normal. CN XI: Head turning and shoulder shrug are intact  MOTOR: There is no pronator drift of out-stretched arms. Muscle bulk and tone are normal. Muscle strength is normal.  REFLEXES: Reflexes are 2+ and symmetric at the biceps, triceps, knees, and ankles. Plantar responses are flexor.  SENSORY: Intact to light touch, pinprick and vibratory sensation are intact in fingers and toes.  COORDINATION: There is no trunk or limb dysmetria noted.  GAIT/STANCE: Posture is normal. Gait is steady with normal steps, base, arm swing, and turning. Heel and toe walking are normal. Tandem gait is normal.  Romberg is absent.  REVIEW OF SYSTEMS:  Full 14 system review of systems performed and notable only for as above All other review of systems were negative.   ALLERGIES: No Known Allergies  HOME MEDICATIONS: Current Outpatient Medications  Medication Sig Dispense Refill   amphetamine-dextroamphetamine (ADDERALL)  10 MG tablet Take 10-20 mg by mouth 2 (two) times daily with a meal. 20 mg in the am and 10 mg at lunch     promethazine (PHENERGAN) 25 MG tablet Take 1 tablet (25 mg total) by mouth every 6 (six) hours as needed for nausea or vomiting. 30 tablet 1   VITAMIN D PO Take 2,000 Units by mouth. (Patient not taking: Reported on 06/04/2022)     No current  facility-administered medications for this visit.    PAST MEDICAL HISTORY: Past Medical History:  Diagnosis Date   Anxiety    BV (bacterial vaginosis) 12/17/2013   Depression    Hypertension    Ovarian cyst    Vaginal discharge 12/17/2013    PAST SURGICAL HISTORY: Past Surgical History:  Procedure Laterality Date   CERVICAL ABLATION N/A 04/02/2020   Procedure: LASER ABLATION OF CERVIX;  Surgeon: Lazaro Arms, MD;  Location: [facility];  Service: Gynecology;  Laterality: N/A;   CHOLECYSTECTOMY     WISDOM TOOTH EXTRACTION      FAMILY HISTORY: Family History  Problem Relation Age of Onset   Cancer Maternal Grandmother        ovarian, uterine   COPD Maternal Grandfather    Emphysema Maternal Grandfather    Hypertension Father    Cancer Father        prostate   Depression Father    Anxiety disorder Father    Cirrhosis Father    COPD Father    Kidney disease Father    Diabetes Mother    Thyroid disease Mother        had thyroid removed   Hypertension Mother    Heart attack Mother    ADD / ADHD Daughter    Diabetes Maternal Aunt    Diabetes Maternal Aunt    Diabetes Maternal Aunt    Diabetes Maternal Uncle    Diabetes Maternal Uncle    Diabetes Maternal Uncle    Diabetes Maternal Uncle    Diabetes Maternal Uncle    Diabetes Maternal Uncle    Diabetes Maternal Uncle    Asthma Paternal Aunt     SOCIAL HISTORY: Social History   Socioeconomic History   Marital status: Significant Other    Spouse name: Not on file   Number of children: Not on file   Years of education: Not on file   Highest education level: Not on file  Occupational History   Not on file  Tobacco Use   Smoking status: Former    Packs/day: 0.50    Years: 10.00    Total pack years: 5.00    Types: Cigarettes   Smokeless tobacco: Never  Vaping Use   Vaping Use: Every day  Substance and Sexual Activity   Alcohol use: Not Currently    Comment: weekends   Drug use: No   Sexual activity: Yes     Birth control/protection: None  Other Topics Concern   Not on file  Social History Narrative   Not on file   Social Determinants of Health   Financial Resource Strain: Low Risk  (06/04/2022)   Overall Financial Resource Strain (CARDIA)    Difficulty of Paying Living Expenses: Not very hard  Food Insecurity: No Food Insecurity (06/04/2022)   Hunger Vital Sign    Worried About Running Out of Food in the Last Year: Never true    Ran Out of Food in the Last Year: Never true  Transportation Needs: No Transportation Needs (06/04/2022)   PRAPARE -  Administrator, Civil Service (Medical): No    Lack of Transportation (Non-Medical): No  Physical Activity: Inactive (06/04/2022)   Exercise Vital Sign    Days of Exercise per Week: 0 days    Minutes of Exercise per Session: 0 min  Stress: No Stress Concern Present (06/04/2022)   [employer] of Occupational Health - Occupational Stress Questionnaire    Feeling of Stress : Only a little  Social Connections: Socially Isolated (06/04/2022)   Social Connection and Isolation Panel [NHANES]    Frequency of Communication with Friends and Family: Twice a week    Frequency of Social Gatherings with Friends and Family: Once a week    Attends Religious Services: Never    Database administrator or Organizations: No    Attends Banker Meetings: Never    Marital Status: Never married  Intimate Partner Violence: Not At Risk (06/04/2022)   Humiliation, Afraid, Rape, and Kick questionnaire    Fear of Current or Ex-Partner: No    Emotionally Abused: No    Physically Abused: No    Sexually Abused: No      Levert Feinstein, M.D. Ph.D.  [facility] [address] [place], [place] [address] Ph: [phone] Fax: [phone]  CC:  Loetta Rough, MD [address] [place],  [place] [address]  Erasmo Downer, NP

## 2022-07-06 ENCOUNTER — Other Ambulatory Visit: Payer: Self-pay | Admitting: Obstetrics & Gynecology

## 2022-07-06 DIAGNOSIS — Z3682 Encounter for antenatal screening for nuchal translucency: Secondary | ICD-10-CM

## 2022-07-07 ENCOUNTER — Ambulatory Visit (INDEPENDENT_AMBULATORY_CARE_PROVIDER_SITE_OTHER): Payer: No Typology Code available for payment source

## 2022-07-07 ENCOUNTER — Encounter: Payer: Self-pay | Admitting: Advanced Practice Midwife

## 2022-07-07 ENCOUNTER — Ambulatory Visit (INDEPENDENT_AMBULATORY_CARE_PROVIDER_SITE_OTHER): Payer: No Typology Code available for payment source | Admitting: Advanced Practice Midwife

## 2022-07-07 VITALS — BP 123/83 | HR 86 | Wt 144.0 lb

## 2022-07-07 DIAGNOSIS — Z3A13 13 weeks gestation of pregnancy: Secondary | ICD-10-CM

## 2022-07-07 DIAGNOSIS — Z3682 Encounter for antenatal screening for nuchal translucency: Secondary | ICD-10-CM | POA: Diagnosis not present

## 2022-07-07 DIAGNOSIS — O161 Unspecified maternal hypertension, first trimester: Secondary | ICD-10-CM

## 2022-07-07 DIAGNOSIS — Z113 Encounter for screening for infections with a predominantly sexual mode of transmission: Secondary | ICD-10-CM

## 2022-07-07 DIAGNOSIS — F419 Anxiety disorder, unspecified: Secondary | ICD-10-CM

## 2022-07-07 DIAGNOSIS — Z1379 Encounter for other screening for genetic and chromosomal anomalies: Secondary | ICD-10-CM

## 2022-07-07 DIAGNOSIS — I10 Essential (primary) hypertension: Secondary | ICD-10-CM

## 2022-07-07 DIAGNOSIS — Z363 Encounter for antenatal screening for malformations: Secondary | ICD-10-CM

## 2022-07-07 DIAGNOSIS — Z3480 Encounter for supervision of other normal pregnancy, unspecified trimester: Secondary | ICD-10-CM | POA: Diagnosis not present

## 2022-07-07 MED ORDER — ASPIRIN 81 MG PO CHEW: 162.0000 mg | CHEWABLE_TABLET | Freq: Every day | ORAL | 7 refills | Status: DC

## 2022-07-07 NOTE — Progress Notes (Signed)
INITIAL OBSTETRICAL VISIT Patient name: Carrie Mckenzie MRN [id number]  Date of birth: 08/04/1991 Chief Complaint:   Initial Prenatal Visit  History of Present Illness:   Carrie Mckenzie is a 31 y.o. [phone] African-American female at 77w4dby LMP c/w u/s at 8.6 weeks with an Estimated Date of Delivery: 01/08/23 being seen today for her initial obstetrical visit.   Patient's last menstrual period was 04/03/2022 (approximate). Her obstetrical history is significant for  SVB (2009, 2015, [address] with complication of ppHTN; received the dx of cHTN after 2018 delivery when her BP didn't return to normal; currently tapering off Adderall- has approx 10 more 1105mtabs .   Today she reports no complaints.  Last pap Mar & Dec 2022. Results were: NILM w/ HRHPV positive: type not specified     07/07/2022    2:49 PM 12/16/2016    9:29 AM 09/28/2016    3:59 PM  Depression screen PHQ 2/9  Decreased Interest 2 0 0  Down, Depressed, Hopeless 1 0 0  PHQ - 2 Score 3 0 0  Altered sleeping 0 0   Tired, decreased energy 2 2   Change in appetite 2 2   Feeling bad or failure about yourself  2 0   Trouble concentrating 1 0   Moving slowly or fidgety/restless 0 0   Suicidal thoughts 0 0   PHQ-9 Score 10 4         07/07/2022    2:49 PM  GAD 7 : Generalized Anxiety Score  Nervous, Anxious, on Edge 2  Control/stop worrying 2  Worry too much - different things 2  Trouble relaxing 2  Restless 1  Easily annoyed or irritable 3  Afraid - awful might happen 2  Total GAD 7 Score 14     Review of Systems:   Pertinent items are noted in HPI Denies cramping/contractions, leakage of fluid, vaginal bleeding, abnormal vaginal discharge w/ itching/odor/irritation, headaches, visual changes, shortness of breath, chest pain, abdominal pain, severe nausea/vomiting, or problems with urination or bowel movements unless otherwise stated above.  Pertinent History Reviewed:  Reviewed past medical,surgical, social,  obstetrical and family history.  Reviewed problem list, medications and allergies. OB History  Gravida Para Term Preterm AB Living  _0 0 1 3  SAB IAB Ectopic Multiple Live Births  1 0 0 0 3    # Outcome Date GA Lbr Len/2nd Weight Sex Delivery Anes PTL Lv  5 Current           4 Term 07/06/17 [id number]:19 / 00:07 5 lb 12.4 oz (2.619 kg) F Vag-Spont EPI N LIV     Birth Comments: none     Complications: Postpartum hypertension  3 Term 04/02/14 [id number] / 00:27 7 lb 5.1 oz (3.32 kg) M Vag-Spont EPI N LIV     Complications: Postpartum hypertension  2 SAB 2010          1 Term 01/09/08 [id number] 6 oz (3.345 kg) F Vag-Spont EPI N LIV     Birth Comments: chorioamnionitis, baby w/ pneumonia in NICU x 1wk     Complications: Chorioamnionitis, Postpartum hypertension   Physical Assessment:   Vitals:   07/07/22 1517  BP: 123/83  Pulse: 86  Weight: 144 lb (65.3 kg)  Body mass index is 23.24 kg/m.       Physical Examination:  General appearance - well appearing, and in no distress  Mental status - alert, oriented to person, place, and  time  Psych:  She has a normal mood and affect  Skin - warm and dry, normal color, no suspicious lesions noted  Chest - effort normal, all lung fields clear to auscultation bilaterally  Heart - normal rate and regular rhythm  Abdomen - soft, nontender  Extremities:  No swelling or varicosities noted  Pelvic - not indicated  Thin prep pap is not done    TODAY'S NT [place] 13+4 wks,measurements c/w dates,FHR 150 bpm,NB present,NT 2.3 mm,CRL 81.81 mm,normal ovaries,posterior placenta   No results found for this or any previous visit (from the past 24 hour(s)).  Assessment & Plan:  1) High-Risk Pregnancy [id number] at 79w4dwith an Estimated Date of Delivery: 01/08/23   2) Initial OB visit  3) cHTN, no meds currently; rx bASA 1681mday; baseline labs today  4) Weaning off Adderall, has approx 10 more days worth of 1048mabs; understands is a Cat C med; PCP  is LinAngelina Mckenzie and will be in communication as far as how she is doing  Meds:  Meds ordered this encounter  Medications   aspirin 81 MG chewable tablet    Sig: Chew 2 tablets (162 mg total) by mouth daily.    Dispense:  60 tablet    Refill:  7    Order Specific Question:   Supervising Provider    Answer:   Carrie Pupa0[[id number]] Initial labs obtained Continue prenatal vitamins Reviewed n/v relief measures and warning s/s to report Reviewed recommended weight gain based on pre-gravid BMI Encouraged well-balanced diet Genetic & carrier screening discussed: requests Panorama and NT/IT, requests Horizon  Ultrasound discussed; fetal survey: [place] faxed if has or is planning to apply for medicaid The nature of [facility] MDs and other Advanced Practice Providers was explained to patient; also emphasized that fellows, residents, and students are part of our team. Does have home bp cuff. Office bp cuff given: no. Rx sent: n/a. Check bp weekly, let us [place]ow if consistently >140/90.   Indications for ASA therapy (per uptodate) One of the following: CHTN Yes  Indications for early A1C (per uptodate) BMI >=25 (>=23 in Asian women) AND one of the following HTN or on therapy for hypertension Yes  Follow-up: Return for HROB 4wk and 2nd IT; 7wk HROB and anatomy u/s.   Orders Placed This Encounter  Procedures   Urine Culture   GC/Chlamydia Probe Amp   US [place] Comp + 14 Wk   CBC/D/Plt+RPR+Rh+ABO+RubIgG...   Comp Met (CMET)   Protein / creatinine ratio, urine   Integrated 1   HORIZON CUSTOM   Panorama Prenatal Test Full Panel   HgB A1c    [name]/29/2023 3:41 PM

## 2022-07-07 NOTE — Patient Instructions (Signed)
Carrie Mckenzie, thank you for choosing our office today! We appreciate the opportunity to meet your healthcare needs. You may receive a short survey by mail, e-mail, or through [employer]. If you are happy with your care we would appreciate if you could take just a few minutes to complete the survey questions. We read all of your comments and take your feedback very seriously. Thank you again for choosing our office.  Center for [employer] [facility] at [facility]  [facility] at [facility] ([address] [place], [place] [address]) Entrance C, located off of [address] Free 24/7 valet parking   Nausea & Vomiting Have saltine crackers or pretzels by your bed and eat a few bites before you raise your head out of bed in the morning Eat small frequent meals throughout the day instead of large meals Drink plenty of fluids throughout the day to stay hydrated, just don't drink a lot of fluids with your meals.  This can make your stomach fill up faster making you feel sick Do not brush your teeth right after you eat Products with real ginger are good for nausea, like ginger ale and ginger hard candy Make sure it says made with real ginger! Sucking on sour candy like lemon heads is also good for nausea If your prenatal vitamins make you nauseated, take them at night so you will sleep through the nausea Sea Bands If you feel like you need medicine for the nausea & vomiting please let us know If you are unable to keep any fluids or food down please let us know   Constipation Drink plenty of fluid, preferably water, throughout the day Eat foods high in fiber such as fruits, vegetables, and grains Exercise, such as walking, is a good way to keep your bowels regular Drink warm fluids, especially warm prune juice, or decaf coffee Eat a 1/2 cup of real oatmeal (not instant), 1/2 cup applesauce, and 1/2-1 cup warm prune juice every day If needed, you may take Colace (docusate sodium) stool softener  once or twice a day to help keep the stool soft.  If you still are having problems with constipation, you may take Miralax once daily as needed to help keep your bowels regular.   Home Blood Pressure Monitoring for Patients   Your provider has recommended that you check your blood pressure (BP) at least once a week at home. If you do not have a blood pressure cuff at home, one will be provided for you. Contact your provider if you have not received your monitor within 1 week.   Helpful Tips for Accurate Home Blood Pressure Checks  Don't smoke, exercise, or drink caffeine 30 minutes before checking your BP Use the restroom before checking your BP (a full bladder can raise your pressure) Relax in a comfortable upright chair Feet on the ground Left arm resting comfortably on a flat surface at the level of your heart Legs uncrossed Back supported Sit quietly and don't talk Place the cuff on your bare arm Adjust snuggly, so that only two fingertips can fit between your skin and the top of the cuff Check 2 readings separated by at least one minute Keep a log of your BP readings For a visual, please reference this diagram: http://ccnc.care/bpdiagram  Provider Name: [facility]     Phone: [phone]  Zone 1: ALL CLEAR  Continue to monitor your symptoms:  BP reading is less than 140 (top number) or less than 90 (bottom  number)  No right upper stomach pain No headaches or seeing spots No feeling nauseated or throwing up No swelling in face and hands  Zone 2: CAUTION Call your doctor's office for any of the following:  BP reading is greater than 140 (top number) or greater than 90 (bottom number)  Stomach pain under your ribs in the middle or right side Headaches or seeing spots Feeling nauseated or throwing up Swelling in face and hands  Zone 3: EMERGENCY  Seek immediate medical care if you have any of the following:  BP reading is greater than160 (top number) or greater than  110 (bottom number) Severe headaches not improving with Tylenol Serious difficulty catching your breath Any worsening symptoms from Zone 2    First Trimester of Pregnancy The first trimester of pregnancy is from week 1 until the end of week 12 (months 1 through 3). A week after a sperm fertilizes an egg, the egg will implant on the wall of the uterus. This embryo will begin to develop into a baby. Genes from you and your partner are forming the baby. The female genes determine whether the baby is a boy or a girl. At 6-8 weeks, the eyes and face are formed, and the heartbeat can be seen on ultrasound. At the end of 12 weeks, all the baby's organs are formed.  Now that you are pregnant, you will want to do everything you can to have a healthy baby. Two of the most important things are to get good prenatal care and to follow your health care provider's instructions. Prenatal care is all the medical care you receive before the baby's birth. This care will help prevent, find, and treat any problems during the pregnancy and childbirth. BODY CHANGES Your body goes through many changes during pregnancy. The changes vary from woman to woman.  You may gain or lose a couple of pounds at first. You may feel sick to your stomach (nauseous) and throw up (vomit). If the vomiting is uncontrollable, call your health care provider. You may tire easily. You may develop headaches that can be relieved by medicines approved by your health care provider. You may urinate more often. Painful urination may mean you have a bladder infection. You may develop heartburn as a result of your pregnancy. You may develop constipation because certain hormones are causing the muscles that push waste through your intestines to slow down. You may develop hemorrhoids or swollen, bulging veins (varicose veins). Your breasts may begin to grow larger and become tender. Your nipples may stick out more, and the tissue that surrounds them  (areola) may become darker. Your gums may bleed and may be sensitive to brushing and flossing. Dark spots or blotches (chloasma, mask of pregnancy) may develop on your face. This will likely fade after the baby is born. Your menstrual periods will stop. You may have a loss of appetite. You may develop cravings for certain kinds of food. You may have changes in your emotions from day to day, such as being excited to be pregnant or being concerned that something may go wrong with the pregnancy and baby. You may have more vivid and strange dreams. You may have changes in your hair. These can include thickening of your hair, rapid growth, and changes in texture. Some women also have hair loss during or after pregnancy, or hair that feels dry or thin. Your hair will most likely return to normal after your baby is born. WHAT TO EXPECT AT YOUR PRENATAL  VISITS During a routine prenatal visit: You will be weighed to make sure you and the baby are growing normally. Your blood pressure will be taken. Your abdomen will be measured to track your baby's growth. The fetal heartbeat will be listened to starting around week 10 or 12 of your pregnancy. Test results from any previous visits will be discussed. Your health care provider may ask you: How you are feeling. If you are feeling the baby move. If you have had any abnormal symptoms, such as leaking fluid, bleeding, severe headaches, or abdominal cramping. If you have any questions. Other tests that may be performed during your first trimester include: Blood tests to find your blood type and to check for the presence of any previous infections. They will also be used to check for low iron levels (anemia) and Rh antibodies. Later in the pregnancy, blood tests for diabetes will be done along with other tests if problems develop. Urine tests to check for infections, diabetes, or protein in the urine. An ultrasound to confirm the proper growth and development  of the baby. An amniocentesis to check for possible genetic problems. Fetal screens for spina bifida and Down syndrome. You may need other tests to make sure you and the baby are doing well. HOME CARE INSTRUCTIONS  Medicines Follow your health care provider's instructions regarding medicine use. Specific medicines may be either safe or unsafe to take during pregnancy. Take your prenatal vitamins as directed. If you develop constipation, try taking a stool softener if your health care provider approves. Diet Eat regular, well-balanced meals. Choose a variety of foods, such as meat or vegetable-based protein, fish, milk and low-fat dairy products, vegetables, fruits, and whole grain breads and cereals. Your health care provider will help you determine the amount of weight gain that is right for you. Avoid raw meat and uncooked cheese. These carry germs that can cause birth defects in the baby. Eating four or five small meals rather than three large meals a day may help relieve nausea and vomiting. If you start to feel nauseous, eating a few soda crackers can be helpful. Drinking liquids between meals instead of during meals also seems to help nausea and vomiting. If you develop constipation, eat more high-fiber foods, such as fresh vegetables or fruit and whole grains. Drink enough fluids to keep your urine clear or pale yellow. Activity and Exercise Exercise only as directed by your health care provider. Exercising will help you: Control your weight. Stay in shape. Be prepared for labor and delivery. Experiencing pain or cramping in the lower abdomen or low back is a good sign that you should stop exercising. Check with your health care provider before continuing normal exercises. Try to avoid standing for long periods of time. Move your legs often if you must stand in one place for a long time. Avoid heavy lifting. Wear low-heeled shoes, and practice good posture. You may continue to have sex  unless your health care provider directs you otherwise. Relief of Pain or Discomfort Wear a good support bra for breast tenderness.   Take warm sitz baths to soothe any pain or discomfort caused by hemorrhoids. Use hemorrhoid cream if your health care provider approves.   Rest with your legs elevated if you have leg cramps or low back pain. If you develop varicose veins in your legs, wear support hose. Elevate your feet for 15 minutes, 3-4 times a day. Limit salt in your diet. Prenatal Care Schedule your prenatal visits by the  twelfth week of pregnancy. They are usually scheduled monthly at first, then more often in the last 2 months before delivery. Write down your questions. Take them to your prenatal visits. Keep all your prenatal visits as directed by your health care provider. Safety Wear your seat belt at all times when driving. Make a list of emergency phone numbers, including numbers for family, friends, the hospital, and police and fire departments. General Tips Ask your health care provider for a referral to a local prenatal education class. Begin classes no later than at the beginning of month 6 of your pregnancy. Ask for help if you have counseling or nutritional needs during pregnancy. Your health care provider can offer advice or refer you to specialists for help with various needs. Do not use hot tubs, steam rooms, or saunas. Do not douche or use tampons or scented sanitary pads. Do not cross your legs for long periods of time. Avoid cat litter boxes and soil used by cats. These carry germs that can cause birth defects in the baby and possibly loss of the fetus by miscarriage or stillbirth. Avoid all smoking, herbs, alcohol, and medicines not prescribed by your health care provider. Chemicals in these affect the formation and growth of the baby. Schedule a dentist appointment. At home, brush your teeth with a soft toothbrush and be gentle when you floss. SEEK MEDICAL CARE IF:   You have dizziness. You have mild pelvic cramps, pelvic pressure, or nagging pain in the abdominal area. You have persistent nausea, vomiting, or diarrhea. You have a bad smelling vaginal discharge. You have pain with urination. You notice increased swelling in your face, hands, legs, or ankles. SEEK IMMEDIATE MEDICAL CARE IF:  You have a fever. You are leaking fluid from your vagina. You have spotting or bleeding from your vagina. You have severe abdominal cramping or pain. You have rapid weight gain or loss. You vomit blood or material that looks like coffee grounds. You are exposed to [place] measles and have never had them. You are exposed to fifth disease or chickenpox. You develop a severe headache. You have shortness of breath. You have any kind of trauma, such as from a fall or a car accident. Document Released: 07/20/2001 Document Revised: 12/10/2013 Document Reviewed: 06/05/2013 [facility] Patient Information 2015 [place], [place]. This information is not intended to replace advice given to you by your health care provider. Make sure you discuss any questions you have with your health care provider.

## 2022-07-07 NOTE — Progress Notes (Signed)
[place] 13+4 wks,measurements c/w dates,FHR 150 bpm,NB present,NT 2.3 mm,CRL 81.81 mm,normal ovaries,posterior placenta

## 2022-07-09 LAB — COMPREHENSIVE METABOLIC PANEL
ALT: 5 IU/L (ref 0–32)
AST: 12 IU/L (ref 0–40)
Albumin/Globulin Ratio: 1.5 (ref 1.2–2.2)
Albumin: 4.4 g/dL (ref 3.9–4.9)
Alkaline Phosphatase: 62 IU/L (ref 44–121)
BUN/Creatinine Ratio: 10 (ref 9–23)
BUN: 6 mg/dL (ref 6–20)
Bilirubin Total: <0.2 mg/dL (ref 0.0–1.2)
CO2: 21 mmol/L (ref 20–29)
Calcium: 9.4 mg/dL (ref 8.7–10.2)
Chloride: 99 mmol/L (ref 96–106)
Creatinine, Ser: 0.61 mg/dL (ref 0.57–1.00)
Globulin, Total: 2.9 g/dL (ref 1.5–4.5)
Glucose: 96 mg/dL (ref 70–99)
Potassium: 3.5 mmol/L (ref 3.5–5.2)
Sodium: 136 mmol/L (ref 134–144)
Total Protein: 7.3 g/dL (ref 6.0–8.5)
eGFR: 121 mL/min/{1.73_m2} (ref >59)

## 2022-07-09 LAB — INTEGRATED 1
Crown Rump Length: 81.8 mm
Gest. Age on Collection Date: 13.9 weeks
Maternal Age at EDD: 34.3 yr
Nuchal Translucency (NT): 2.3 mm
Number of Fetuses: 1
PAPP-A Value: 3431 ng/mL
Weight: 144 [lb_av]

## 2022-07-09 LAB — CBC/D/PLT+RPR+RH+ABO+RUBIGG...
Basophils Absolute: 0.1 10*3/uL (ref 0.0–0.2)
Basos: 1 %
EOS (ABSOLUTE): 0.1 10*3/uL (ref 0.0–0.4)
Eos: 1 %
HCV Ab: NONREACTIVE
HIV Screen 4th Generation wRfx: NONREACTIVE
Hematocrit: 37.6 % (ref 34.0–46.6)
Hemoglobin: 12.6 g/dL (ref 11.1–15.9)
Hepatitis B Surface Ag: NEGATIVE
Immature Grans (Abs): 0 10*3/uL (ref 0.0–0.1)
Immature Granulocytes: 0 %
Lymphocytes Absolute: 1.7 10*3/uL (ref 0.7–3.1)
Lymphs: 34 %
MCH: 28 pg (ref 26.6–33.0)
MCHC: 33.5 g/dL (ref 31.5–35.7)
MCV: 84 fL (ref 79–97)
Monocytes Absolute: 0.2 10*3/uL (ref 0.1–0.9)
Monocytes: 5 %
Neutrophils Absolute: 3 10*3/uL (ref 1.4–7.0)
Neutrophils: 59 %
Platelets: 262 10*3/uL (ref 150–450)
RBC: 4.5 x10E6/uL (ref 3.77–5.28)
RDW: 14.8 % (ref 11.7–15.4)
RPR Ser Ql: NONREACTIVE
Rh Factor: POSITIVE
Rubella Antibodies, IGG: 1.41 index (ref >0.99)
WBC: 5 10*3/uL (ref 3.4–10.8)

## 2022-07-09 LAB — PROTEIN / CREATININE RATIO, URINE
Creatinine, Urine: 453.6 mg/dL
Protein, Ur: 37.3 mg/dL
Protein/Creat Ratio: 82 mg/g creat (ref 0–200)

## 2022-07-09 LAB — AB SCR+ANTIBODY ID: Antibody Screen: POSITIVE — AB

## 2022-07-09 LAB — HCV INTERPRETATION

## 2022-07-09 LAB — URINE CULTURE

## 2022-07-09 LAB — HEMOGLOBIN A1C
Est. average glucose Bld gHb Est-mCnc: 105 mg/dL
Hgb A1c MFr Bld: 5.3 % (ref 4.8–5.6)

## 2022-07-10 LAB — GC/CHLAMYDIA PROBE AMP
Chlamydia trachomatis, NAA: NEGATIVE
Neisseria Gonorrhoeae by PCR: NEGATIVE

## 2022-07-11 LAB — PANORAMA PRENATAL TEST FULL PANEL:PANORAMA TEST PLUS 5 ADDITIONAL MICRODELETIONS

## 2022-07-12 ENCOUNTER — Encounter: Payer: Self-pay | Admitting: Advanced Practice Midwife

## 2022-07-15 ENCOUNTER — Other Ambulatory Visit: Payer: No Typology Code available for payment source

## 2022-07-16 ENCOUNTER — Other Ambulatory Visit (INDEPENDENT_AMBULATORY_CARE_PROVIDER_SITE_OTHER): Payer: No Typology Code available for payment source

## 2022-07-16 DIAGNOSIS — O0992 Supervision of high risk pregnancy, unspecified, second trimester: Secondary | ICD-10-CM

## 2022-07-16 DIAGNOSIS — Z3A14 14 weeks gestation of pregnancy: Secondary | ICD-10-CM

## 2022-07-16 NOTE — Progress Notes (Signed)
   NURSE VISIT- [facility]  SUBJECTIVE:  Carrie Mckenzie is a 31 y.o. [phone] female here for [facility]or[facility] NIPT redraw. She is [redacted]w[redacted]d pregnant.   OBJECTIVE:  Appears well, in no apparent distress  Blood work drawn from right [facility] without difficulty. 1 attempt(s).   ASSESSMENT: Pregnancy [redacted]w[redacted]d Panorama NIPT  PLAN: [facility] portal information given and instructed patient how to access results Follow-up as scheduled  Jobe Marker  07/16/2022 11:15 AM

## 2022-07-22 LAB — HORIZON CUSTOM: REPORT SUMMARY: NEGATIVE

## 2022-07-23 LAB — PANORAMA PRENATAL TEST FULL PANEL:PANORAMA TEST PLUS 5 ADDITIONAL MICRODELETIONS: FETAL FRACTION: 13.9

## 2022-08-06 ENCOUNTER — Ambulatory Visit (INDEPENDENT_AMBULATORY_CARE_PROVIDER_SITE_OTHER): Payer: No Typology Code available for payment source | Admitting: Family Medicine

## 2022-08-06 ENCOUNTER — Encounter: Payer: Self-pay | Admitting: Family Medicine

## 2022-08-06 VITALS — BP 113/73 | HR 75 | Wt 148.8 lb

## 2022-08-06 DIAGNOSIS — Z3A17 17 weeks gestation of pregnancy: Secondary | ICD-10-CM

## 2022-08-06 DIAGNOSIS — Z1379 Encounter for other screening for genetic and chromosomal anomalies: Secondary | ICD-10-CM

## 2022-08-06 DIAGNOSIS — O0992 Supervision of high risk pregnancy, unspecified, second trimester: Secondary | ICD-10-CM

## 2022-08-06 NOTE — Progress Notes (Cosign Needed)
HIGH-RISK PREGNANCY VISIT Patient name: Carrie Mckenzie MRN [id number]  Date of birth: 02/15/1991 Chief Complaint:   High Risk Gestation (2nd IT today; also repeat antibody screen)  History of Present Illness:   Carrie Mckenzie is a 31 y.o. [id number] female at [redacted]w[redacted]d with an Estimated Date of Delivery: 01/08/23 being seen today for ongoing management of a high-risk pregnancy complicated by chronic hypertension currently on no medication.    Today she reports no complaints. Contractions: Not present. Vag. Bleeding: None.   . denies leaking of fluid.      07/07/2022    2:49 PM 12/16/2016    9:29 AM 09/28/2016    3:59 PM  Depression screen PHQ 2/9  Decreased Interest 2 0 0  Down, Depressed, Hopeless 1 0 0  PHQ - 2 Score 3 0 0  Altered sleeping 0 0   Tired, decreased energy 2 2   Change in appetite 2 2   Feeling bad or failure about yourself  2 0   Trouble concentrating 1 0   Moving slowly or fidgety/restless 0 0   Suicidal thoughts 0 0   PHQ-9 Score 10 4         07/07/2022    2:49 PM  GAD 7 : Generalized Anxiety Score  Nervous, Anxious, on Edge 2  Control/stop worrying 2  Worry too much - different things 2  Trouble relaxing 2  Restless 1  Easily annoyed or irritable 3  Afraid - awful might happen 2  Total GAD 7 Score 14     Review of Systems:   Pertinent items are noted in HPI Denies abnormal vaginal discharge w/ itching/odor/irritation, headaches, visual changes, shortness of breath, chest pain, abdominal pain, severe nausea/vomiting, or problems with urination or bowel movements unless otherwise stated above. Pertinent History Reviewed:  Reviewed past medical,surgical, social, obstetrical and family history.  Reviewed problem list, medications and allergies. Physical Assessment:   Vitals:   08/06/22 1143  BP: 113/73  Pulse: 75  Weight: 148 lb 12.8 oz (67.5 kg)  Body mass index is 24.02 kg/m.           Physical Examination:   General appearance: alert, well  appearing, and in no distress  Mental status: alert, oriented to person, place, and time  Skin: warm & dry   Extremities:      Cardiovascular: normal heart rate noted  Respiratory: normal respiratory effort, no distress  Abdomen: gravid, soft, non-tender  Pelvic: Cervical exam deferred         Fetal Status:          Fetal Surveillance Testing today: None    Chaperone: N/A    No results found for this or any previous visit (from the past 24 hour(s)).  Assessment & Plan:  High-risk pregnancy: [id number] at [redacted]w[redacted]d with an Estimated Date of Delivery: 01/08/23      ICD-10-CM   1. Supervision of high risk pregnancy in second trimester  O09.92 Antibody screen    2. Encounter for genetic screening  Z13.79 INTEGRATED 2    3. [redacted] weeks gestation of pregnancy  Z3A.17 Antibody screen      cHTN : BP within normal limits today.  No signs of preeclampsia.  Baseline labs within normal limits.  Will continue to monitor.  Adderall use: Patient has discontinued use of Adderall   Meds: No orders of the defined types were placed in this encounter.   Orders:  Orders Placed This Encounter  Procedures   INTEGRATED 2  Antibody screen     Labs/procedures today: none   Reviewed: Preterm labor symptoms and general obstetric precautions including but not limited to vaginal bleeding, contractions, leaking of fluid and fetal movement were reviewed in detail with the patient.  All questions were answered. Does have home bp cuff. Office bp cuff given: not applicable. Check bp weekly, let us know if consistently >140 and/or >90.  Follow-up: No follow-ups on file.   Future Appointments  Date Time Provider Department Center  08/25/2022 10:00 AM [facility] [place] [facility] None  08/25/2022 10:50 AM Arabella Merles, CNM [facility]    Orders Placed This Encounter  Procedures   INTEGRATED 2   Antibody screen   Celedonio Savage MD [facility] 08/06/2022 12:45 PM

## 2022-08-07 LAB — ANTIBODY SCREEN: Antibody Screen: NEGATIVE

## 2022-08-09 NOTE — L&D Delivery Note (Signed)
OB/GYN Faculty Practice Delivery Note  Carrie Mckenzie is a 32 y.o. [id number] s/p SVD at [redacted]w[redacted]d. She was admitted for SOL.   ROM: 2h 31m with clear fluid GBS Status: GBS unknown   Maximum Maternal Temperature:  Temp (24hrs), Avg:98.1 F (36.7 C), Min:97.9 F (36.6 C), Max:98.3 F (36.8 C)    Labor Progress: Patient arrived at 6 cm dilation and progressed spontaneously.   Delivery Date/Time: 12/13/2022 at 0754 Delivery: Called to room and patient was complete and pushing. Head delivered in ROA position. No nuchal cord present. Shoulder and body delivered in usual fashion. Infant with spontaneous cry, placed on mother's abdomen, dried and stimulated. Cord clamped x 2 after 1-minute delay, and cut by mother. Cord blood drawn. Placenta delivered spontaneously with gentle cord traction. Fundus firm with massage and Pitocin. Labia, perineum, vagina, and cervix inspected with no lacerations.   Placenta:  spontaneous, intact, 3 vessel cord  Complications: none Lacerations: none EBL: 103 mL Analgesia: epidural    Infant: APGAR (1 MIN):   APGAR (5 MINS):   APGAR (10 MINS):    Weight: pending  Derrel Nip, MD  OB Fellow  12/13/2022 8:22 AM

## 2022-08-11 LAB — INTEGRATED 2
AFP MoM: 0.86
Alpha-Fetoprotein: 40.3 ng/mL
Crown Rump Length: 81.8 mm
DIA MoM: 0.71
DIA Value: 110.2 pg/mL
Estriol, Unconjugated: 2.51 ng/mL
Gest. Age on Collection Date: 13.9 weeks
Gestational Age: 18.1 weeks
Maternal Age at EDD: 34.3 yr
Nuchal Translucency (NT): 2.3 mm
Nuchal Translucency MoM: 1.18
Number of Fetuses: 1
PAPP-A MoM: 2.04
PAPP-A Value: 3431 ng/mL
Test Results:: NEGATIVE
Weight: 144 [lb_av]
Weight: 149 [lb_av]
hCG MoM: 0.53
hCG Value: 14 IU/mL
uE3 MoM: 1.69

## 2022-08-25 ENCOUNTER — Ambulatory Visit (INDEPENDENT_AMBULATORY_CARE_PROVIDER_SITE_OTHER): Payer: No Typology Code available for payment source | Admitting: Advanced Practice Midwife

## 2022-08-25 ENCOUNTER — Ambulatory Visit (INDEPENDENT_AMBULATORY_CARE_PROVIDER_SITE_OTHER): Payer: No Typology Code available for payment source

## 2022-08-25 VITALS — BP 108/60 | HR 70 | Wt 149.2 lb

## 2022-08-25 DIAGNOSIS — Z3A20 20 weeks gestation of pregnancy: Secondary | ICD-10-CM

## 2022-08-25 DIAGNOSIS — Z363 Encounter for antenatal screening for malformations: Secondary | ICD-10-CM

## 2022-08-25 DIAGNOSIS — O0992 Supervision of high risk pregnancy, unspecified, second trimester: Secondary | ICD-10-CM

## 2022-08-25 NOTE — Patient Instructions (Signed)
Carrie Mckenzie, thank you for choosing our office today! We appreciate the opportunity to meet your healthcare needs. You may receive a short survey by mail, e-mail, or through [employer]. If you are happy with your care we would appreciate if you could take just a few minutes to complete the survey questions. We read all of your comments and take your feedback very seriously. Thank you again for choosing our office.  [facility] [employer] Team at [place] [facility] ([place], [place] [address]) Entrance C, located off of [place] parking  Go to [email or web address] to register for FREE online childbirth classes  Call the office [phone]) or go to [facility] if: You begin to severe cramping Your water breaks.  Sometimes it is a big gush of fluid, sometimes it is just a trickle that keeps getting your panties wet or running down your legs You have vaginal bleeding.  It is normal to have a small amount of spotting if your cervix was checked.   [facility] Pediatricians/Family Doctors [facility]): [address] [place], [facility]: [address] [place], [phone]                [facility]): [place], [phone] (call to ask if accepting patients) [facility] Department: [place] [address], [place], [place] Pediatricians/Family Doctors [facility]): [place]. [place], [address], [facility]: [address] [place], [place] [facility]: [place], [facility]): [phone] [facility]: [address], [place]: [address], [place] [facility]: [phone], [phone]  Home Blood Pressure Monitoring for Patients   Your provider has recommended that you check your blood pressure (BP) at least once a week at home. If you do not have a blood pressure cuff at home, one will be provided for you. Contact your provider if you have not received your monitor within 1 week.   Helpful Tips for Accurate Home Blood Pressure Checks  Don't smoke, exercise, or drink caffeine 30 minutes before checking your BP Use the restroom before checking your BP (a full bladder can raise your pressure) Relax in a comfortable upright chair Feet on the ground Left arm resting comfortably on a flat surface at the level of your heart Legs uncrossed Back supported Sit quietly and don't talk Place the cuff on your bare arm Adjust snuggly, so that only two fingertips can fit between your skin and the top of the cuff Check 2 readings separated by at least one minute Keep a log of your BP readings For a visual, please reference this diagram: http://ccnc.care/bpdiagram  Provider Name: [facility]     Phone: [phone]  Zone 1: ALL CLEAR  Continue to monitor your symptoms:  BP reading is less than 140 (top number) or less than 90 (bottom number)  No right upper stomach pain No headaches or seeing spots No feeling nauseated or throwing up No swelling in face and hands  Zone 2: CAUTION Call your doctor's office for any of the following:  BP reading is greater than 140 (top number) or greater than  90 (bottom number)  Stomach pain under your ribs in the middle or right side Headaches or seeing spots Feeling nauseated or throwing up Swelling in face and hands  Zone 3: EMERGENCY  Seek immediate medical care if you have any of the following:  BP reading is greater than160 (top number) or greater than 110 (bottom number) Severe headaches not improving with Tylenol Serious difficulty catching your breath Any worsening symptoms from  Zone 2     Second Trimester of Pregnancy The second trimester is from week 14 through week 27 (months 4 through 6). The second trimester is often a time when you feel your best. Your body has adjusted to being pregnant, and you begin to feel better physically. Usually, morning sickness has lessened or quit completely, you may have more energy, and you may have an increase in appetite. The second trimester is also a time when the fetus is growing rapidly. At the end of the sixth month, the fetus is about 9 inches long and weighs about 1 pounds. You will likely begin to feel the baby move (quickening) between 16 and 20 weeks of pregnancy. Body changes during your second trimester Your body continues to go through many changes during your second trimester. The changes vary from woman to woman. Your weight will continue to increase. You will notice your lower abdomen bulging out. You may begin to get stretch marks on your hips, abdomen, and breasts. You may develop headaches that can be relieved by medicines. The medicines should be approved by your health care provider. You may urinate more often because the fetus is pressing on your bladder. You may develop or continue to have heartburn as a result of your pregnancy. You may develop constipation because certain hormones are causing the muscles that push waste through your intestines to slow down. You may develop hemorrhoids or swollen, bulging veins (varicose veins). You may have back pain. This is caused by: Weight gain. Pregnancy hormones that are relaxing the joints in your pelvis. A shift in weight and the muscles that support your balance. Your breasts will continue to grow and they will continue to become tender. Your gums may bleed and may be sensitive to brushing and flossing. Dark spots or blotches (chloasma, mask of pregnancy) may develop on your face. This will likely fade after the baby is born. A dark line from your belly button to  the pubic area (linea nigra) may appear. This will likely fade after the baby is born. You may have changes in your hair. These can include thickening of your hair, rapid growth, and changes in texture. Some women also have hair loss during or after pregnancy, or hair that feels dry or thin. Your hair will most likely return to normal after your baby is born.  What to expect at prenatal visits During a routine prenatal visit: You will be weighed to make sure you and the fetus are growing normally. Your blood pressure will be taken. Your abdomen will be measured to track your baby's growth. The fetal heartbeat will be listened to. Any test results from the previous visit will be discussed.  Your health care provider may ask you: How you are feeling. If you are feeling the baby move. If you have had any abnormal symptoms, such as leaking fluid, bleeding, severe headaches, or abdominal cramping. If you are using any tobacco products, including cigarettes, chewing tobacco, and electronic cigarettes. If you have any questions.  Other tests that may be performed during  your second trimester include: Blood tests that check for: Low iron levels (anemia). High blood sugar that affects pregnant women (gestational diabetes) between 41 and 28 weeks. Rh antibodies. This is to check for a protein on red blood cells (Rh factor). Urine tests to check for infections, diabetes, or protein in the urine. An ultrasound to confirm the proper growth and development of the baby. An amniocentesis to check for possible genetic problems. Fetal screens for spina bifida and Down syndrome. HIV (human immunodeficiency virus) testing. Routine prenatal testing includes screening for HIV, unless you choose not to have this test.  Follow these instructions at home: Medicines Follow your health care provider's instructions regarding medicine use. Specific medicines may be either safe or unsafe to take during  pregnancy. Take a prenatal vitamin that contains at least 600 micrograms (mcg) of folic acid. If you develop constipation, try taking a stool softener if your health care provider approves. Eating and drinking Eat a balanced diet that includes fresh fruits and vegetables, whole grains, good sources of protein such as meat, eggs, or tofu, and low-fat dairy. Your health care provider will help you determine the amount of weight gain that is right for you. Avoid raw meat and uncooked cheese. These carry germs that can cause birth defects in the baby. If you have low calcium intake from food, talk to your health care provider about whether you should take a daily calcium supplement. Limit foods that are high in fat and processed sugars, such as fried and sweet foods. To prevent constipation: Drink enough fluid to keep your urine clear or pale yellow. Eat foods that are high in fiber, such as fresh fruits and vegetables, whole grains, and beans. Activity Exercise only as directed by your health care provider. Most women can continue their usual exercise routine during pregnancy. Try to exercise for 30 minutes at least 5 days a week. Stop exercising if you experience uterine contractions. Avoid heavy lifting, wear low heel shoes, and practice good posture. A sexual relationship may be continued unless your health care provider directs you otherwise. Relieving pain and discomfort Wear a good support bra to prevent discomfort from breast tenderness. Take warm sitz baths to soothe any pain or discomfort caused by hemorrhoids. Use hemorrhoid cream if your health care provider approves. Rest with your legs elevated if you have leg cramps or low back pain. If you develop varicose veins, wear support hose. Elevate your feet for 15 minutes, 3-4 times a day. Limit salt in your diet. Prenatal Care Write down your questions. Take them to your prenatal visits. Keep all your prenatal visits as told by your health  care provider. This is important. Safety Wear your seat belt at all times when driving. Make a list of emergency phone numbers, including numbers for family, friends, the hospital, and police and fire departments. General instructions Ask your health care provider for a referral to a local prenatal education class. Begin classes no later than the beginning of month 6 of your pregnancy. Ask for help if you have counseling or nutritional needs during pregnancy. Your health care provider can offer advice or refer you to specialists for help with various needs. Do not use hot tubs, steam rooms, or saunas. Do not douche or use tampons or scented sanitary pads. Do not cross your legs for long periods of time. Avoid cat litter boxes and soil used by cats. These carry germs that can cause birth defects in the baby and possibly loss of the  fetus by miscarriage or stillbirth. Avoid all smoking, herbs, alcohol, and unprescribed drugs. Chemicals in these products can affect the formation and growth of the baby. Do not use any products that contain nicotine or tobacco, such as cigarettes and e-cigarettes. If you need help quitting, ask your health care provider. Visit your dentist if you have not gone yet during your pregnancy. Use a soft toothbrush to brush your teeth and be gentle when you floss. Contact a health care provider if: You have dizziness. You have mild pelvic cramps, pelvic pressure, or nagging pain in the abdominal area. You have persistent nausea, vomiting, or diarrhea. You have a bad smelling vaginal discharge. You have pain when you urinate. Get help right away if: You have a fever. You are leaking fluid from your vagina. You have spotting or bleeding from your vagina. You have severe abdominal cramping or pain. You have rapid weight gain or weight loss. You have shortness of breath with chest pain. You notice sudden or extreme swelling of your face, hands, ankles, feet, or legs. You  have not felt your baby move in over an hour. You have severe headaches that do not go away when you take medicine. You have vision changes. Summary The second trimester is from week 14 through week 27 (months 4 through 6). It is also a time when the fetus is growing rapidly. Your body goes through many changes during pregnancy. The changes vary from woman to woman. Avoid all smoking, herbs, alcohol, and unprescribed drugs. These chemicals affect the formation and growth your baby. Do not use any tobacco products, such as cigarettes, chewing tobacco, and e-cigarettes. If you need help quitting, ask your health care provider. Contact your health care provider if you have any questions. Keep all prenatal visits as told by your health care provider. This is important. This information is not intended to replace advice given to you by your health care provider. Make sure you discuss any questions you have with your health care provider. Document Released: 07/20/2001 Document Revised: 01/01/2016 Document Reviewed: 09/26/2012 Elsevier Interactive Patient Education  2017 [employer].

## 2022-08-25 NOTE — Progress Notes (Signed)
HIGH-RISK PREGNANCY VISIT Patient name: Carrie Mckenzie MRN [id number]  Date of birth: May 17, 1991 Chief Complaint:   Routine Prenatal Visit  History of Present Illness:   Carrie Mckenzie is a 32 y.o. [id number] female at [redacted]w[redacted]d with an Estimated Date of Delivery: 01/08/23 being seen today for ongoing management of a high-risk pregnancy complicated by chronic hypertension currently on no meds.  Weaned off Adderall- doing okay except feels a little less focused at work.  Today she reports no complaints. Contractions: Not present. Vag. Bleeding: None.  Movement: Present. denies leaking of fluid.      07/07/2022    2:49 PM 12/16/2016    9:29 AM 09/28/2016    3:59 PM  Depression screen PHQ 2/9  Decreased Interest 2 0 0  Down, Depressed, Hopeless 1 0 0  PHQ - 2 Score 3 0 0  Altered sleeping 0 0   Tired, decreased energy 2 2   Change in appetite 2 2   Feeling bad or failure about yourself  2 0   Trouble concentrating 1 0   Moving slowly or fidgety/restless 0 0   Suicidal thoughts 0 0   PHQ-9 Score 10 4         07/07/2022    2:49 PM  GAD 7 : Generalized Anxiety Score  Nervous, Anxious, on Edge 2  Control/stop worrying 2  Worry too much - different things 2  Trouble relaxing 2  Restless 1  Easily annoyed or irritable 3  Afraid - awful might happen 2  Total GAD 7 Score 14     Review of Systems:   Pertinent items are noted in HPI Denies abnormal vaginal discharge w/ itching/odor/irritation, headaches, visual changes, shortness of breath, chest pain, abdominal pain, severe nausea/vomiting, or problems with urination or bowel movements unless otherwise stated above. Pertinent History Reviewed:  Reviewed past medical,surgical, social, obstetrical and family history.  Reviewed problem list, medications and allergies. Physical Assessment:   Vitals:   08/25/22 1046  BP: 108/60  Pulse: 70  Weight: 149 lb 3.2 oz (67.7 kg)  Body mass index is 24.08 kg/m.           Physical  Examination:   General appearance: alert, well appearing, and in no distress  Mental status: alert, oriented to person, place, and time  Skin: warm & dry   Extremities:      Cardiovascular: normal heart rate noted  Respiratory: normal respiratory effort, no distress  Abdomen: gravid, soft, non-tender  Pelvic: Cervical exam deferred         Fetal Status: Fetal Heart Rate (bpm): 120 u/s   Movement: Present    Fetal Surveillance Testing today: [place] 20+4 wks,breech,cx 3.2 cm,normal ovaries,posterior placenta gr 0,SVP of fluid 5.5 cm,FHR 120 bpm,EFW 369 g 49%,anatomy complete,no obvious abnormalities     No results found for this or any previous visit (from the past 24 hour(s)).  Assessment & Plan:  High-risk pregnancy: [id number] at [redacted]w[redacted]d with an Estimated Date of Delivery: 01/08/23   1) cHTN, stable without meds; not taking bASA- encouraged to start  2) Rubella NI, recommend MMR postpartum  Meds: No orders of the defined types were placed in this encounter.   Labs/procedures today: U/S  Treatment Plan:  growth q 4wk 28-32-36; no testing; IOL 38-39.6wk  Reviewed: Preterm labor symptoms and general obstetric precautions including but not limited to vaginal bleeding, contractions, leaking of fluid and fetal movement were reviewed in detail with the patient.  All questions were answered. Does have home  bp cuff. Office bp cuff given: not applicable. Check bp weekly, let us know if consistently >140 and/or >90.  Follow-up: Return in about 4 weeks (around 09/22/2022) for HROB, in person.   No future appointments.  No orders of the defined types were placed in this encounter.  Myrtis Ser CNM, [facility] 08/25/2022 11:07 AM

## 2022-08-25 NOTE — Progress Notes (Signed)
[place] 20+4 wks,breech,cx 3.2 cm,normal ovaries,posterior placenta gr 0,SVP of fluid 5.5 cm,FHR 120 bpm,EFW 369 g 49%,anatomy complete,no obvious abnormalities

## 2022-09-24 ENCOUNTER — Ambulatory Visit (INDEPENDENT_AMBULATORY_CARE_PROVIDER_SITE_OTHER): Payer: No Typology Code available for payment source | Admitting: Advanced Practice Midwife

## 2022-09-24 VITALS — BP 111/69 | HR 76 | Wt 151.0 lb

## 2022-09-24 DIAGNOSIS — Z3A24 24 weeks gestation of pregnancy: Secondary | ICD-10-CM

## 2022-09-24 DIAGNOSIS — O10919 Unspecified pre-existing hypertension complicating pregnancy, unspecified trimester: Secondary | ICD-10-CM

## 2022-09-24 DIAGNOSIS — O10912 Unspecified pre-existing hypertension complicating pregnancy, second trimester: Secondary | ICD-10-CM

## 2022-09-24 DIAGNOSIS — O0992 Supervision of high risk pregnancy, unspecified, second trimester: Secondary | ICD-10-CM

## 2022-09-24 NOTE — Patient Instructions (Signed)
Carrie Mckenzie, I greatly value your feedback.  If you receive a survey following your visit with [place] today, we appreciate you taking the time to fill it out.  Thanks, Derrill Memo, CNM   You will have your sugar test next visit.  Please do not eat or drink anything after midnight the night before you come, not even water.  You will be here for at least two hours.  Please make an appointment online for the bloodwork at [email or web address] for 8:30am (or as close to this as possible). Make sure you select the [facility] service center. The day of the appointment, check in with our office first, then you will go to [place] to start the sugar test.    [place]!!! It is now [place] at [facility] ([place], [place] [address]) Entrance C, located off of [address] parking  Go to [email or web address] to register for FREE online childbirth classes   Call the office [phone]) or go to [facility] if: You begin to have strong, frequent contractions Your water breaks.  Sometimes it is a big gush of fluid, sometimes it is just a trickle that keeps getting your panties wet or running down your legs You have vaginal bleeding.  It is normal to have a small amount of spotting if your cervix was checked.  You don't feel your baby moving like normal.  If you don't, get you something to eat and drink and lay down and focus on feeling your baby move.   If your baby is still not moving like normal, you should call the office or go to [place] [facility]/Family Doctors: [place] [phone]                [place] [phone] (usually not accepting new patients unless you have family there already, you are always welcome to call and ask)      [facility] Department [phone]       [facility] Pediatricians/Family Doctors:  [facility]: [phone] [facility]: [phone] [facility]: [place] Doctors:  [facility]: [place] [facility]: [place]: [place]: [phone]   Home Blood Pressure Monitoring for Patients   Your provider has recommended that you check your blood pressure (BP) at least once a week at home. If you do not have a blood pressure cuff at home, one will be provided for you. Contact your provider if you have not received your monitor within 1 week.   Helpful Tips for Accurate Home Blood Pressure Checks  Don't smoke, exercise, or drink caffeine 30 minutes before checking your BP Use the restroom before checking your BP (a full bladder can raise your pressure) Relax in a comfortable upright chair Feet on the ground Left arm resting comfortably on a flat surface at the level of your heart Legs uncrossed Back supported Sit quietly and don't talk Place the cuff on your bare arm Adjust snuggly, so that only two fingertips can fit between your skin and the top of the cuff Check 2 readings separated by at least one minute Keep a log of your BP readings For a visual, please reference this diagram: http://ccnc.care/bpdiagram  Provider Name: [facility]     Phone: [phone]  Zone 1: ALL CLEAR  Continue to monitor your symptoms:  BP reading is less than 140 (top number) or less than 90 (bottom number)  No right upper stomach pain No headaches or seeing spots No feeling nauseated or throwing up No swelling in face and hands  Zone 2: CAUTION Call your doctor's office for any of the following:  BP reading is greater than 140 (top number) or greater than 90 (bottom number)  Stomach pain under your ribs in the middle or right side Headaches or seeing spots Feeling nauseated or throwing up Swelling in face and hands  Zone 3: EMERGENCY  Seek  immediate medical care if you have any of the following:  BP reading is greater than160 (top number) or greater than 110 (bottom number) Severe headaches not improving with Tylenol Serious difficulty catching your breath Any worsening symptoms from Zone 2   Second Trimester of Pregnancy The second trimester is from week 13 through week 28, months 4 through 6. The second trimester is often a time when you feel your best. Your body has also adjusted to being pregnant, and you begin to feel better physically. Usually, morning sickness has lessened or quit completely, you may have more energy, and you may have an increase in appetite. The second trimester is also a time when the fetus is growing rapidly. At the end of the sixth month, the fetus is about 9 inches long and weighs about 1 pounds. You will likely begin to feel the baby move (quickening) between 18 and 20 weeks of the pregnancy. BODY CHANGES Your body goes through many changes during pregnancy. The changes vary from woman to woman.  Your weight will continue to increase. You will notice your lower abdomen bulging out. You may begin to get stretch marks on your hips, abdomen, and breasts. You may develop headaches that can be relieved by medicines approved by your health care provider. You may urinate more often because the fetus is pressing on your bladder. You may develop or continue to have heartburn as a result of your pregnancy. You may develop constipation because certain hormones are causing the muscles that push waste through your intestines to slow down. You may develop hemorrhoids or swollen, bulging veins (varicose veins). You may have back pain because of the weight gain and pregnancy hormones relaxing your joints between the bones in your pelvis and as a result of a shift in weight and the muscles that support your balance. Your breasts will continue to grow and be tender. Your gums may bleed and may be sensitive to brushing  and flossing. Dark spots or blotches (chloasma, mask of pregnancy) may develop on your face. This will likely fade after the baby is born. A dark line from your belly button to the pubic area (linea nigra) may appear. This will likely fade after the baby is born. You may have changes in your hair. These can include thickening of your hair, rapid growth, and changes in texture. Some women also have hair loss during or after pregnancy, or hair that feels dry or thin. Your hair will most likely return to normal after your baby is born. WHAT TO EXPECT AT YOUR PRENATAL VISITS During a routine prenatal visit: You will be weighed to make sure you and the fetus are growing normally. Your blood pressure will be taken. Your abdomen will be measured to track your baby's growth. The fetal heartbeat will be listened to. Any test results from the previous visit will be discussed. Your health care provider  may ask you: How you are feeling. If you are feeling the baby move. If you have had any abnormal symptoms, such as leaking fluid, bleeding, severe headaches, or abdominal cramping. If you have any questions. Other tests that may be performed during your second trimester include: Blood tests that check for: Low iron levels (anemia). Gestational diabetes (between 24 and 28 weeks). Rh antibodies. Urine tests to check for infections, diabetes, or protein in the urine. An ultrasound to confirm the proper growth and development of the baby. An amniocentesis to check for possible genetic problems. Fetal screens for spina bifida and Down syndrome. HOME CARE INSTRUCTIONS  Avoid all smoking, herbs, alcohol, and unprescribed drugs. These chemicals affect the formation and growth of the baby. Follow your health care provider's instructions regarding medicine use. There are medicines that are either safe or unsafe to take during pregnancy. Exercise only as directed by your health care provider. Experiencing  uterine cramps is a good sign to stop exercising. Continue to eat regular, healthy meals. Wear a good support bra for breast tenderness. Do not use hot tubs, steam rooms, or saunas. Wear your seat belt at all times when driving. Avoid raw meat, uncooked cheese, cat litter boxes, and soil used by cats. These carry germs that can cause birth defects in the baby. Take your prenatal vitamins. Try taking a stool softener (if your health care provider approves) if you develop constipation. Eat more high-fiber foods, such as fresh vegetables or fruit and whole grains. Drink plenty of fluids to keep your urine clear or pale yellow. Take warm sitz baths to soothe any pain or discomfort caused by hemorrhoids. Use hemorrhoid cream if your health care provider approves. If you develop varicose veins, wear support hose. Elevate your feet for 15 minutes, 3-4 times a day. Limit salt in your diet. Avoid heavy lifting, wear low heel shoes, and practice good posture. Rest with your legs elevated if you have leg cramps or low back pain. Visit your dentist if you have not gone yet during your pregnancy. Use a soft toothbrush to brush your teeth and be gentle when you floss. A sexual relationship may be continued unless your health care provider directs you otherwise. Continue to go to all your prenatal visits as directed by your health care provider. SEEK MEDICAL CARE IF:  You have dizziness. You have mild pelvic cramps, pelvic pressure, or nagging pain in the abdominal area. You have persistent nausea, vomiting, or diarrhea. You have a bad smelling vaginal discharge. You have pain with urination. SEEK IMMEDIATE MEDICAL CARE IF:  You have a fever. You are leaking fluid from your vagina. You have spotting or bleeding from your vagina. You have severe abdominal cramping or pain. You have rapid weight gain or loss. You have shortness of breath with chest pain. You notice sudden or extreme swelling of your face,  hands, ankles, feet, or legs. You have not felt your baby move in over an hour. You have severe headaches that do not go away with medicine. You have vision changes. Document Released: 07/20/2001 Document Revised: 07/31/2013 Document Reviewed: 09/26/2012 [facility] Patient Information 2015 [place], [place]. This information is not intended to replace advice given to you by your health care provider. Make sure you discuss any questions you have with your health care provider.

## 2022-09-24 NOTE — Progress Notes (Signed)
HIGH-RISK PREGNANCY VISIT Patient name: Carrie Mckenzie MRN [id number]  Date of birth: [birth year] Chief Complaint:   Routine Prenatal Visit  History of Present Illness:   Consuelo Geralds is a 32 y.o. [id number] female at 58w6dwith an Estimated Date of Delivery: 01/08/23 being seen today for ongoing management of a high-risk pregnancy complicated by chronic hypertension currently on no meds.    Today she reports  having episodes of hypotension; isn't drinking very much and isn't eating 'healthy' .  .  .   . denies leaking of fluid.      07/07/2022    2:49 PM 12/16/2016    9:29 AM 09/28/2016    3:59 PM  Depression screen PHQ 2/9  Decreased Interest 2 0 0  Down, Depressed, Hopeless 1 0 0  PHQ - 2 Score 3 0 0  Altered sleeping 0 0   Tired, decreased energy 2 2   Change in appetite 2 2   Feeling bad or failure about yourself  2 0   Trouble concentrating 1 0   Moving slowly or fidgety/restless 0 0   Suicidal thoughts 0 0   PHQ-9 Score 10 4         07/07/2022    2:49 PM  GAD 7 : Generalized Anxiety Score  Nervous, Anxious, on Edge 2  Control/stop worrying 2  Worry too much - different things 2  Trouble relaxing 2  Restless 1  Easily annoyed or irritable 3  Afraid - awful might happen 2  Total GAD 7 Score 14     Review of Systems:   Pertinent items are noted in HPI Denies abnormal vaginal discharge w/ itching/odor/irritation, headaches, visual changes, shortness of breath, chest pain, abdominal pain, severe nausea/vomiting, or problems with urination or bowel movements unless otherwise stated above. Pertinent History Reviewed:  Reviewed past medical,surgical, social, obstetrical and family history.  Reviewed problem list, medications and allergies. Physical Assessment:   Vitals:   09/24/22 1014  BP: 111/69  Pulse: 76  Weight: 151 lb (68.5 kg)  Body mass index is 24.37 kg/m.           Physical Examination:   General appearance: alert, well appearing, and in no  distress  Mental status: alert, oriented to person, place, and time  Skin: warm & dry   Extremities:      Cardiovascular: normal heart rate noted  Respiratory: normal respiratory effort, no distress  Abdomen: gravid, soft, non-tender  Pelvic: Cervical exam deferred         Fetal Status: Fetal Heart Rate (bpm): 143 Fundal Height: 25 cm      Fetal Surveillance Testing today: doppler    No results found for this or any previous visit (from the past 24 hour(s)).  Assessment & Plan:  High-risk pregnancy: [id number] 241w6dith an Estimated Date of Delivery: 01/08/23   1) cHTN, stable without meds; actually having hypotension/vagal episodes (will working on drinking/eating more); will get growth u/s with next visit   Meds: No orders of the defined types were placed in this encounter.   Labs/procedures today: none  Treatment Plan:  growth q 4wks; IOL 38-39.6wks  Reviewed: Preterm labor symptoms and general obstetric precautions including but not limited to vaginal bleeding, contractions, leaking of fluid and fetal movement were reviewed in detail with the patient.  All questions were answered. Does have home bp cuff. Office bp cuff given: not applicable. Check bp daily, let usKoreanow if consistently >140 and/or >90.  Follow-up: Return in  about 3 weeks (around 10/15/2022) for HROB, PN2, [place]: EFW, in person.   Future Appointments  Date Time Provider [place]  09/24/2022 10:30 AM Myrtis Ser, CNM [facility]    Orders Placed This Encounter  Procedures   US OB Follow Up   Myrtis Ser [facility] 09/24/2022 10:28 AM

## 2022-10-21 ENCOUNTER — Encounter: Payer: Self-pay | Admitting: Obstetrics & Gynecology

## 2022-10-21 ENCOUNTER — Other Ambulatory Visit: Payer: No Typology Code available for payment source

## 2022-10-21 ENCOUNTER — Ambulatory Visit (INDEPENDENT_AMBULATORY_CARE_PROVIDER_SITE_OTHER): Payer: No Typology Code available for payment source

## 2022-10-21 ENCOUNTER — Ambulatory Visit (INDEPENDENT_AMBULATORY_CARE_PROVIDER_SITE_OTHER): Payer: No Typology Code available for payment source | Admitting: Obstetrics & Gynecology

## 2022-10-21 VITALS — BP 110/66 | HR 84 | Wt 156.0 lb

## 2022-10-21 DIAGNOSIS — O0992 Supervision of high risk pregnancy, unspecified, second trimester: Secondary | ICD-10-CM

## 2022-10-21 DIAGNOSIS — Z131 Encounter for screening for diabetes mellitus: Secondary | ICD-10-CM

## 2022-10-21 DIAGNOSIS — O10919 Unspecified pre-existing hypertension complicating pregnancy, unspecified trimester: Secondary | ICD-10-CM

## 2022-10-21 DIAGNOSIS — Z3A28 28 weeks gestation of pregnancy: Secondary | ICD-10-CM

## 2022-10-21 DIAGNOSIS — O10913 Unspecified pre-existing hypertension complicating pregnancy, third trimester: Secondary | ICD-10-CM

## 2022-10-21 DIAGNOSIS — O0993 Supervision of high risk pregnancy, unspecified, third trimester: Secondary | ICD-10-CM | POA: Diagnosis not present

## 2022-10-21 NOTE — Progress Notes (Signed)
HIGH-RISK PREGNANCY VISIT Patient name: Carrie Mckenzie MRN [id number]  Date of birth: Sep 06, 1990 Chief Complaint:   Routine Prenatal Visit (Pn2 and [place])  History of Present Illness:   Carrie Mckenzie is a 32 y.o. [phone] female at 15w5dwith an Estimated Date of Delivery: 01/08/23 being seen today for ongoing management of a high-risk pregnancy complicated by chronic hypertension currently on no meds.    Today she reports no complaints. Contractions: Not present. Vag. Bleeding: None.  Movement: Present. denies leaking of fluid.      07/07/2022    2:49 PM 12/16/2016    9:29 AM 09/28/2016    3:59 PM  Depression screen PHQ 2/9  Decreased Interest 2 0 0  Down, Depressed, Hopeless 1 0 0  PHQ - 2 Score 3 0 0  Altered sleeping 0 0   Tired, decreased energy 2 2   Change in appetite 2 2   Feeling bad or failure about yourself  2 0   Trouble concentrating 1 0   Moving slowly or fidgety/restless 0 0   Suicidal thoughts 0 0   PHQ-9 Score 10 4         07/07/2022    2:49 PM  GAD 7 : Generalized Anxiety Score  Nervous, Anxious, on Edge 2  Control/stop worrying 2  Worry too much - different things 2  Trouble relaxing 2  Restless 1  Easily annoyed or irritable 3  Afraid - awful might happen 2  Total GAD 7 Score 14     Review of Systems:   Pertinent items are noted in HPI Denies abnormal vaginal discharge w/ itching/odor/irritation, headaches, visual changes, shortness of breath, chest pain, abdominal pain, severe nausea/vomiting, or problems with urination or bowel movements unless otherwise stated above. Pertinent History Reviewed:  Reviewed past medical,surgical, social, obstetrical and family history.  Reviewed problem list, medications and allergies. Physical Assessment:   Vitals:   10/21/22 0944  BP: 110/66  Pulse: 84  Weight: 156 lb (70.8 kg)  Body mass index is 25.18 kg/m.           Physical Examination:   General appearance: alert, well appearing, and in no  distress  Mental status: alert, oriented to person, place, and time  Skin: warm & dry   Extremities: Edema: None    Cardiovascular: normal heart rate noted  Respiratory: normal respiratory effort, no distress  Abdomen: gravid, soft, non-tender  Pelvic: Cervical exam deferred         Fetal Status:     Movement: Present    Fetal Surveillance Testing today: sonogram EFW 33%   Chaperone: N/A    No results found for this or any previous visit (from the past 24 hour(s)).  Assessment & Plan:  High-risk pregnancy: [id number] 231w5dith an Estimated Date of Delivery: 01/08/23      ICD-10-CM   1. Supervision of high risk pregnancy in second trimester  O09.92     2. Chronic hypertension affecting pregnancy, no meds, EFW 33%  O10.919         Meds: No orders of the defined types were placed in this encounter.   Orders: No orders of the defined types were placed in this encounter.    Labs/procedures today: U/S  Treatment Plan:  no meds so will recommend weekly BPP 32 weeks unless clinically changes    Follow-up: Return in about 3 weeks (around 11/11/2022) for HROB.   No future appointments.  No orders of the defined types were placed  in this encounter.  Florian Buff  Attending Physician for the [facility] 10/21/2022 10:44 AM

## 2022-10-21 NOTE — Progress Notes (Signed)
[place] [id number] wks,cephalic,posterior placenta gr 0,normal left ovary right ovary not visualized,SVP of fluid 5.5 cm,cx 2.4 cm,FHR 133 bpm,EFW 1255 g 33%

## 2022-10-22 LAB — RPR: RPR Ser Ql: NONREACTIVE

## 2022-10-22 LAB — GLUCOSE TOLERANCE, 2 HOURS W/ 1HR
Glucose, 1 hour: 140 mg/dL (ref 70–179)
Glucose, 2 hour: 120 mg/dL (ref 70–152)
Glucose, Fasting: 75 mg/dL (ref 70–91)

## 2022-10-22 LAB — CBC
Hematocrit: 30.4 % — ABNORMAL LOW (ref 34.0–46.6)
Hemoglobin: 9.7 g/dL — ABNORMAL LOW (ref 11.1–15.9)
MCH: 27.2 pg (ref 26.6–33.0)
MCHC: 31.9 g/dL (ref 31.5–35.7)
MCV: 85 fL (ref 79–97)
Platelets: 262 10*3/uL (ref 150–450)
RBC: 3.56 x10E6/uL — ABNORMAL LOW (ref 3.77–5.28)
RDW: 12.6 % (ref 11.7–15.4)
WBC: 5 10*3/uL (ref 3.4–10.8)

## 2022-10-22 LAB — ANTIBODY SCREEN: Antibody Screen: NEGATIVE

## 2022-10-22 LAB — HIV ANTIBODY (ROUTINE TESTING W REFLEX): HIV Screen 4th Generation wRfx: NONREACTIVE

## 2022-11-11 ENCOUNTER — Encounter: Payer: Self-pay | Admitting: Obstetrics & Gynecology

## 2022-11-11 ENCOUNTER — Ambulatory Visit (INDEPENDENT_AMBULATORY_CARE_PROVIDER_SITE_OTHER): Payer: No Typology Code available for payment source | Admitting: Obstetrics & Gynecology

## 2022-11-11 VITALS — BP 113/69 | HR 85 | Wt 161.0 lb

## 2022-11-11 DIAGNOSIS — Z3A31 31 weeks gestation of pregnancy: Secondary | ICD-10-CM

## 2022-11-11 DIAGNOSIS — O10913 Unspecified pre-existing hypertension complicating pregnancy, third trimester: Secondary | ICD-10-CM

## 2022-11-11 DIAGNOSIS — O10919 Unspecified pre-existing hypertension complicating pregnancy, unspecified trimester: Secondary | ICD-10-CM

## 2022-11-11 DIAGNOSIS — O0993 Supervision of high risk pregnancy, unspecified, third trimester: Secondary | ICD-10-CM

## 2022-11-11 NOTE — Progress Notes (Signed)
HIGH-RISK PREGNANCY VISIT Patient name: Carrie Mckenzie MRN [id number]  Date of birth: [birth year] Chief Complaint:   Routine Prenatal Visit  History of Present Illness:   Carrie Mckenzie is a 32 y.o. [id number] female at [redacted]w[redacted]d with an Estimated Date of Delivery: 01/08/23 being seen today for ongoing management of a high-risk pregnancy complicated by chronic hypertension currently on no meds.    Today she reports no complaints. Contractions: Not present. Vag. Bleeding: None.  Movement: Present. denies leaking of fluid.      07/07/2022    2:49 PM 12/16/2016    9:29 AM 09/28/2016    3:59 PM  Depression screen PHQ 2/9  Decreased Interest 2 0 0  Down, Depressed, Hopeless 1 0 0  PHQ - 2 Score 3 0 0  Altered sleeping 0 0   Tired, decreased energy 2 2   Change in appetite 2 2   Feeling bad or failure about yourself  2 0   Trouble concentrating 1 0   Moving slowly or fidgety/restless 0 0   Suicidal thoughts 0 0   PHQ-9 Score 10 4         07/07/2022    2:49 PM  GAD 7 : Generalized Anxiety Score  Nervous, Anxious, on Edge 2  Control/stop worrying 2  Worry too much - different things 2  Trouble relaxing 2  Restless 1  Easily annoyed or irritable 3  Afraid - awful might happen 2  Total GAD 7 Score 14     Review of Systems:   Pertinent items are noted in HPI Denies abnormal vaginal discharge w/ itching/odor/irritation, headaches, visual changes, shortness of breath, chest pain, abdominal pain, severe nausea/vomiting, or problems with urination or bowel movements unless otherwise stated above. Pertinent History Reviewed:  Reviewed past medical,surgical, social, obstetrical and family history.  Reviewed problem list, medications and allergies. Physical Assessment:   Vitals:   11/11/22 1157  BP: 113/69  Pulse: 85  Weight: 161 lb (73 kg)  Body mass index is 25.99 kg/m.           Physical Examination:   General appearance: alert, well appearing, and in no distress  Mental  status: alert, oriented to person, place, and time  Skin: warm & dry   Extremities:      Cardiovascular: normal heart rate noted  Respiratory: normal respiratory effort, no distress  Abdomen: gravid, soft, non-tender  Pelvic: Cervical exam deferred         Fetal Status: Fetal Heart Rate (bpm): 146 Fundal Height: 31 cm Movement: Present    Fetal Surveillance Testing today: FHR 146   Chaperone:     No results found for this or any previous visit (from the past 24 hour(s)).  Assessment & Plan:  High-risk pregnancy: [id number] at [redacted]w[redacted]d with an Estimated Date of Delivery: 01/08/23      ICD-10-CM   1. Supervision of high risk pregnancy in third trimester  O09.93     2. Chronic hypertension affecting pregnancy, no meds, EFW 33%  O10.919         Meds: No orders of the defined types were placed in this encounter.   Orders: No orders of the defined types were placed in this encounter.    Labs/procedures today:   Treatment Plan:  BPP weekly at 34 weeks, IOL 39-40 weeks unless otherwise indicated  Reviewed: Preterm labor symptoms and general obstetric precautions including but not limited to vaginal bleeding, contractions, leaking of fluid and fetal movement were reviewed in detail  with the patient.  All questions were answered. Does have home bp cuff. Office bp cuff given: yes. Check bp weekly, let us know if consistently >140 and/or >90.  Follow-up: Return in about 2 weeks (around 11/25/2022) for weekly BPP start in 2 weeks.   No future appointments.  No orders of the defined types were placed in this encounter.  Florian Buff  Attending Physician for [facility] 11/11/2022 12:08 PM

## 2022-11-17 ENCOUNTER — Telehealth: Payer: Self-pay

## 2022-11-17 NOTE — Telephone Encounter (Signed)
Called patient/left voicemail to see what days she was off the week of May 3rd to schedule ultrasound for that week.

## 2022-11-24 ENCOUNTER — Other Ambulatory Visit: Payer: Self-pay | Admitting: Obstetrics & Gynecology

## 2022-11-24 DIAGNOSIS — O10913 Unspecified pre-existing hypertension complicating pregnancy, third trimester: Secondary | ICD-10-CM

## 2022-11-26 ENCOUNTER — Ambulatory Visit (INDEPENDENT_AMBULATORY_CARE_PROVIDER_SITE_OTHER): Payer: No Typology Code available for payment source

## 2022-11-26 ENCOUNTER — Ambulatory Visit (INDEPENDENT_AMBULATORY_CARE_PROVIDER_SITE_OTHER): Payer: No Typology Code available for payment source | Admitting: Obstetrics & Gynecology

## 2022-11-26 ENCOUNTER — Encounter: Payer: Self-pay | Admitting: Obstetrics & Gynecology

## 2022-11-26 VITALS — BP 116/69 | HR 76 | Wt 164.6 lb

## 2022-11-26 DIAGNOSIS — O0993 Supervision of high risk pregnancy, unspecified, third trimester: Secondary | ICD-10-CM

## 2022-11-26 DIAGNOSIS — Z3A33 33 weeks gestation of pregnancy: Secondary | ICD-10-CM

## 2022-11-26 DIAGNOSIS — O10919 Unspecified pre-existing hypertension complicating pregnancy, unspecified trimester: Secondary | ICD-10-CM

## 2022-11-26 DIAGNOSIS — O10913 Unspecified pre-existing hypertension complicating pregnancy, third trimester: Secondary | ICD-10-CM | POA: Diagnosis not present

## 2022-11-26 NOTE — Progress Notes (Signed)
HIGH-RISK PREGNANCY VISIT Patient name: Carrie Mckenzie MRN [id number]  Date of birth: 13-Sep-1990 Chief Complaint:   Routine Prenatal Visit  History of Present Illness:   Carrie Mckenzie is a 31 y.o. [id number] female at [redacted]w[redacted]d with an Estimated Date of Delivery: 01/08/23 being seen today for ongoing management of a high-risk pregnancy complicated by:  Chronic hypertension-no medications  Today she reports no complaints.   Contractions: Not present. Vag. Bleeding: None.  Movement: Present. denies leaking of fluid.      07/07/2022    2:49 PM 12/16/2016    9:29 AM 09/28/2016    3:59 PM  Depression screen PHQ 2/9  Decreased Interest 2 0 0  Down, Depressed, Hopeless 1 0 0  PHQ - 2 Score 3 0 0  Altered sleeping 0 0   Tired, decreased energy 2 2   Change in appetite 2 2   Feeling bad or failure about yourself  2 0   Trouble concentrating 1 0   Moving slowly or fidgety/restless 0 0   Suicidal thoughts 0 0   PHQ-9 Score 10 4      Current Outpatient Medications  Medication Instructions   aspirin 162 mg, Oral, Daily   VITAMIN D PO 2,000 Units     Review of Systems:   Pertinent items are noted in HPI Denies abnormal vaginal discharge w/ itching/odor/irritation, headaches, visual changes, shortness of breath, chest pain, abdominal pain, severe nausea/vomiting, or problems with urination or bowel movements unless otherwise stated above. Pertinent History Reviewed:  Reviewed past medical,surgical, social, obstetrical and family history.  Reviewed problem list, medications and allergies. Physical Assessment:   Vitals:   11/26/22 1247  BP: 116/69  Pulse: 76  Weight: 164 lb 9.6 oz (74.7 kg)  Body mass index is 26.57 kg/m.           Physical Examination:   General appearance: alert, well appearing, and in no distress  Mental status: normal mood, behavior, speech, dress, motor activity, and thought processes  Skin: warm & dry   Extremities:      Cardiovascular: normal heart rate  noted  Respiratory: normal respiratory effort, no distress  Abdomen: gravid, soft, non-tender  Pelvic: Cervical exam deferred         Fetal Status:     Movement: Present    Fetal Surveillance Testing today: cephalic,BPP 8/8,posterior placenta gr 1,AFI 18 cm,FHR 138 bpm,EFW 2257 g 39%    Chaperone: N/A    No results found for this or any previous visit (from the past 24 hour(s)).   Assessment & Plan:  High-risk pregnancy: [id number] at [redacted]w[redacted]d with an Estimated Date of Delivery: 01/08/23   1) chronic hypertension-no meds BP remains stable Reviewed precautions   Meds: No orders of the defined types were placed in this encounter.   Labs/procedures today: Growth scan, AGA today  Treatment Plan: Continue routine OB care, GBS next visit  Reviewed: Preterm labor symptoms and general obstetric precautions including but not limited to vaginal bleeding, contractions, leaking of fluid and fetal movement were reviewed in detail with the patient.  All questions were answered.  Patient has home bp cuff. Check bp weekly, let us know if >140/90.   Follow-up: Return in about 2 weeks (around 12/10/2022) for HROB visit (ok for midwife) and another growth in 4wks.   Future Appointments  Date Time Provider Department Center  11/26/2022  1:00 PM Myna Hidalgo, DO [facility]  12/03/2022 12:00 PM [facility] [place] [facility] None  12/17/2022  9:15  AM [facility] [place] [facility] None  12/17/2022 10:10 AM Lazaro Arms, MD [facility]  12/24/2022  8:30 AM [facility] [place] [facility] None  12/24/2022  9:50 AM Myna Hidalgo, DO [facility]  12/31/2022  8:30 AM [facility] [place] [facility] None  12/31/2022  9:30 AM Arabella Merles, CNM [facility]  01/07/2023 10:00 AM [facility] [place] [facility] None  01/07/2023 10:50 AM Myna Hidalgo, DO [facility]    No orders of the defined types were placed in this encounter.   Myna Hidalgo, DO Attending Obstetrician & Gynecologist, [facility]  for [employer], [facility]

## 2022-11-26 NOTE — Progress Notes (Signed)
[place] 33+6 wks,cephalic,BPP 8/8,posterior placenta gr 1,AFI 18 cm,FHR 138 bpm,EFW 2257 g 39%

## 2022-11-29 ENCOUNTER — Encounter: Payer: No Typology Code available for payment source | Admitting: Women's Health

## 2022-11-29 ENCOUNTER — Other Ambulatory Visit: Payer: No Typology Code available for payment source

## 2022-12-02 ENCOUNTER — Other Ambulatory Visit: Payer: No Typology Code available for payment source

## 2022-12-02 ENCOUNTER — Other Ambulatory Visit: Payer: Self-pay | Admitting: Obstetrics & Gynecology

## 2022-12-02 ENCOUNTER — Encounter: Payer: No Typology Code available for payment source | Admitting: Advanced Practice Midwife

## 2022-12-02 DIAGNOSIS — O10913 Unspecified pre-existing hypertension complicating pregnancy, third trimester: Secondary | ICD-10-CM

## 2022-12-03 ENCOUNTER — Ambulatory Visit (INDEPENDENT_AMBULATORY_CARE_PROVIDER_SITE_OTHER): Payer: No Typology Code available for payment source | Admitting: Obstetrics & Gynecology

## 2022-12-03 ENCOUNTER — Ambulatory Visit (INDEPENDENT_AMBULATORY_CARE_PROVIDER_SITE_OTHER): Payer: No Typology Code available for payment source

## 2022-12-03 ENCOUNTER — Encounter: Payer: Self-pay | Admitting: Obstetrics & Gynecology

## 2022-12-03 VITALS — BP 111/75 | HR 74 | Wt 166.0 lb

## 2022-12-03 DIAGNOSIS — Z3A34 34 weeks gestation of pregnancy: Secondary | ICD-10-CM

## 2022-12-03 DIAGNOSIS — O10919 Unspecified pre-existing hypertension complicating pregnancy, unspecified trimester: Secondary | ICD-10-CM

## 2022-12-03 DIAGNOSIS — O10913 Unspecified pre-existing hypertension complicating pregnancy, third trimester: Secondary | ICD-10-CM

## 2022-12-03 DIAGNOSIS — O0993 Supervision of high risk pregnancy, unspecified, third trimester: Secondary | ICD-10-CM

## 2022-12-03 NOTE — Progress Notes (Signed)
[place] 34+6 wks,cephalic,FHR 130 bpm,posterior placenta gr 2,AFI 14 cm,BPP 8/8

## 2022-12-03 NOTE — Progress Notes (Signed)
HIGH-RISK PREGNANCY VISIT Patient name: Carrie Mckenzie MRN [id number]  Date of birth: 05-Jun-1991 Chief Complaint:   Routine Prenatal Visit  History of Present Illness:   Carrie Mckenzie is a 32 y.o. [id number] female at [redacted]w[redacted]d with an Estimated Date of Delivery: 01/08/23 being seen today for ongoing management of a high-risk pregnancy complicated by chronic hypertension currently on no meds.    Today she reports no complaints. Contractions: Not present. Vag. Bleeding: None.  Movement: Present. denies leaking of fluid.      07/07/2022    2:49 PM 12/16/2016    9:29 AM 09/28/2016    3:59 PM  Depression screen PHQ 2/9  Decreased Interest 2 0 0  Down, Depressed, Hopeless 1 0 0  PHQ - 2 Score 3 0 0  Altered sleeping 0 0   Tired, decreased energy 2 2   Change in appetite 2 2   Feeling bad or failure about yourself  2 0   Trouble concentrating 1 0   Moving slowly or fidgety/restless 0 0   Suicidal thoughts 0 0   PHQ-9 Score 10 4         07/07/2022    2:49 PM  GAD 7 : Generalized Anxiety Score  Nervous, Anxious, on Edge 2  Control/stop worrying 2  Worry too much - different things 2  Trouble relaxing 2  Restless 1  Easily annoyed or irritable 3  Afraid - awful might happen 2  Total GAD 7 Score 14     Review of Systems:   Pertinent items are noted in HPI Denies abnormal vaginal discharge w/ itching/odor/irritation, headaches, visual changes, shortness of breath, chest pain, abdominal pain, severe nausea/vomiting, or problems with urination or bowel movements unless otherwise stated above. Pertinent History Reviewed:  Reviewed past medical,surgical, social, obstetrical and family history.  Reviewed problem list, medications and allergies. Physical Assessment:   Vitals:   12/03/22 1246  BP: 111/75  Pulse: 74  Weight: 166 lb (75.3 kg)  Body mass index is 26.79 kg/m.           Physical Examination:   General appearance: alert, well appearing, and in no distress  Mental  status: alert, oriented to person, place, and time  Skin: warm & dry   Extremities: Edema: None    Cardiovascular: normal heart rate noted  Respiratory: normal respiratory effort, no distress  Abdomen: gravid, soft, non-tender  Pelvic: Cervical exam deferred         Fetal Status:     Movement: Present    Fetal Surveillance Testing today: BPP 8/8   Chaperone: N/A    No results found for this or any previous visit (from the past 24 hour(s)).  Assessment & Plan:  High-risk pregnancy: [id number] at [redacted]w[redacted]d with an Estimated Date of Delivery: 01/08/23      ICD-10-CM   1. Supervision of high risk pregnancy in third trimester  O09.93     2. Chronic hypertension affecting pregnancy  O10.919       Schedule issue next week-->NST next week 12/08/22  Meds: No orders of the defined types were placed in this encounter.   Orders: No orders of the defined types were placed in this encounter.    Labs/procedures today: U/S  Treatment Plan:  weekly BPP    Follow-up: Return in about 5 days (around 12/08/2022) for NST.   Future Appointments  Date Time Provider Department Center  12/17/2022  9:15 AM [facility] - [facility] [place] [facility] None  12/17/2022 10:10 AM Emersen Carroll,  Amaryllis Dyke, MD [facility]  12/24/2022  8:30 AM [facility] [place] [facility] None  12/24/2022  9:50 AM Myna Hidalgo, DO [facility]  12/31/2022  8:30 AM [facility] [place] [facility] None  12/31/2022  9:30 AM Arabella Merles, CNM [facility]  01/07/2023 10:00 AM [facility] [place] [facility] None  01/07/2023 10:50 AM Myna Hidalgo, DO [facility]    No orders of the defined types were placed in this encounter.  Lazaro Arms  Attending Physician for the Center for [facility] 12/03/2022 12:52 PM

## 2022-12-06 ENCOUNTER — Encounter: Payer: No Typology Code available for payment source | Admitting: Obstetrics & Gynecology

## 2022-12-06 ENCOUNTER — Other Ambulatory Visit: Payer: No Typology Code available for payment source

## 2022-12-08 ENCOUNTER — Ambulatory Visit (INDEPENDENT_AMBULATORY_CARE_PROVIDER_SITE_OTHER): Payer: No Typology Code available for payment source | Admitting: *Deleted

## 2022-12-08 VITALS — BP 119/80 | HR 70

## 2022-12-08 DIAGNOSIS — Z3A35 35 weeks gestation of pregnancy: Secondary | ICD-10-CM | POA: Diagnosis not present

## 2022-12-08 DIAGNOSIS — O0993 Supervision of high risk pregnancy, unspecified, third trimester: Secondary | ICD-10-CM | POA: Diagnosis not present

## 2022-12-08 DIAGNOSIS — I10 Essential (primary) hypertension: Secondary | ICD-10-CM

## 2022-12-08 NOTE — Progress Notes (Signed)
   NURSE VISIT- NST  SUBJECTIVE:  Carrie Mckenzie is a 32 y.o. [phone] female at [redacted]w[redacted]d, here for a NST for pregnancy complicated by [facility].  She reports active fetal movement, contractions: none, vaginal bleeding: none, membranes: intact.   OBJECTIVE:  LMP 04/03/2022 (Approximate)   Appears well, no apparent distress  No results found for this or any previous visit (from the past 24 hour(s)).  NST: FHR baseline 135 bpm, Variability: moderate, Accelerations:present, Decelerations:  Absent= Cat 1/reactive Toco: 1 UC   ASSESSMENT: [id number] at [redacted]w[redacted]d with CHTN NST reactive  PLAN: EFM strip reviewed by Philipp Deputy, CNM   Recommendations: keep next appointment as scheduled    Jobe Marker  12/08/2022 10:31 AM

## 2022-12-13 ENCOUNTER — Other Ambulatory Visit: Payer: Self-pay

## 2022-12-13 ENCOUNTER — Inpatient Hospital Stay (HOSPITAL_COMMUNITY): Payer: No Typology Code available for payment source | Admitting: Anesthesiology

## 2022-12-13 ENCOUNTER — Encounter: Payer: No Typology Code available for payment source | Admitting: Women's Health

## 2022-12-13 ENCOUNTER — Inpatient Hospital Stay (HOSPITAL_COMMUNITY)
Admission: AD | Admit: 2022-12-13 | Discharge: 2022-12-15 | DRG: 807 | Disposition: A | Discharge status: Home / Self Care | Payer: No Typology Code available for payment source | Attending: Family Medicine | Admitting: Family Medicine

## 2022-12-13 ENCOUNTER — Encounter (HOSPITAL_COMMUNITY): Payer: Self-pay | Admitting: Obstetrics & Gynecology

## 2022-12-13 ENCOUNTER — Other Ambulatory Visit: Payer: No Typology Code available for payment source

## 2022-12-13 DIAGNOSIS — O26893 Other specified pregnancy related conditions, third trimester: Secondary | ICD-10-CM | POA: Diagnosis present

## 2022-12-13 DIAGNOSIS — O0990 Supervision of high risk pregnancy, unspecified, unspecified trimester: Secondary | ICD-10-CM

## 2022-12-13 DIAGNOSIS — O99344 Other mental disorders complicating childbirth: Secondary | ICD-10-CM

## 2022-12-13 DIAGNOSIS — Z3A36 36 weeks gestation of pregnancy: Secondary | ICD-10-CM

## 2022-12-13 DIAGNOSIS — O1002 Pre-existing essential hypertension complicating childbirth: Principal | ICD-10-CM | POA: Diagnosis present

## 2022-12-13 DIAGNOSIS — O09899 Supervision of other high risk pregnancies, unspecified trimester: Secondary | ICD-10-CM

## 2022-12-13 DIAGNOSIS — Z2839 Other underimmunization status: Secondary | ICD-10-CM

## 2022-12-13 DIAGNOSIS — Z87891 Personal history of nicotine dependence: Secondary | ICD-10-CM | POA: Diagnosis not present

## 2022-12-13 DIAGNOSIS — I10 Essential (primary) hypertension: Secondary | ICD-10-CM | POA: Diagnosis present

## 2022-12-13 LAB — CBC
HCT: 32 % — ABNORMAL LOW (ref 36.0–46.0)
Hemoglobin: 10.3 g/dL — ABNORMAL LOW (ref 12.0–15.0)
MCH: 24.8 pg — ABNORMAL LOW (ref 26.0–34.0)
MCHC: 32.2 g/dL (ref 30.0–36.0)
MCV: 77.1 fL — ABNORMAL LOW (ref 80.0–100.0)
Platelets: 279 10*3/uL (ref 150–400)
RBC: 4.15 MIL/uL (ref 3.87–5.11)
RDW: 14.9 % (ref 11.5–15.5)
WBC: 14.4 10*3/uL — ABNORMAL HIGH (ref 4.0–10.5)
nRBC: 0 % (ref 0.0–0.2)

## 2022-12-13 LAB — RPR: RPR Ser Ql: NONREACTIVE

## 2022-12-13 LAB — TYPE AND SCREEN
ABO/RH(D): A POS
Antibody Screen: NEGATIVE

## 2022-12-13 MED ORDER — LACTATED RINGERS IV SOLN: 500.0000 mL | INTRAVENOUS | Status: DC | PRN

## 2022-12-13 MED ORDER — OXYCODONE-ACETAMINOPHEN 5-325 MG PO TABS: 1.0000 | ORAL_TABLET | ORAL | Status: DC | PRN

## 2022-12-13 MED ORDER — SENNOSIDES-DOCUSATE SODIUM 8.6-50 MG PO TABS
2.0000 | ORAL_TABLET | ORAL | Status: DC
  Administered 2022-12-13 – 2022-12-15 (×3): 2 via ORAL
  Filled 2022-12-13 (×3): qty 2

## 2022-12-13 MED ORDER — DIBUCAINE (PERIANAL) 1 % EX OINT: 1.0000 | TOPICAL_OINTMENT | CUTANEOUS | Status: DC | PRN

## 2022-12-13 MED ORDER — FENTANYL-BUPIVACAINE-NACL 0.5-0.125-0.9 MG/250ML-% EP SOLN
12.0000 mL/h | EPIDURAL | Status: DC | PRN
  Filled 2022-12-13: qty 250

## 2022-12-13 MED ORDER — ONDANSETRON HCL 4 MG PO TABS: 4.0000 mg | ORAL_TABLET | ORAL | Status: DC | PRN

## 2022-12-13 MED ORDER — LACTATED RINGERS IV SOLN: 500.0000 mL | Freq: Once | INTRAVENOUS | Status: DC

## 2022-12-13 MED ORDER — FENTANYL-BUPIVACAINE-NACL 0.5-0.125-0.9 MG/250ML-% EP SOLN
EPIDURAL | Status: DC | PRN
  Administered 2022-12-13: 12 mL/h via EPIDURAL

## 2022-12-13 MED ORDER — TETANUS-DIPHTH-ACELL PERTUSSIS 5-2.5-18.5 LF-MCG/0.5 IM SUSY: 0.5000 mL | PREFILLED_SYRINGE | Freq: Once | INTRAMUSCULAR | Status: DC

## 2022-12-13 MED ORDER — LIDOCAINE HCL (PF) 1 % IJ SOLN
INTRAMUSCULAR | Status: DC | PRN
  Administered 2022-12-13: 10 mL via EPIDURAL
  Administered 2022-12-13: 2 mL via EPIDURAL

## 2022-12-13 MED ORDER — LACTATED RINGERS IV SOLN: INTRAVENOUS | Status: DC

## 2022-12-13 MED ORDER — ONDANSETRON HCL 4 MG/2ML IJ SOLN: 4.0000 mg | INTRAMUSCULAR | Status: DC | PRN

## 2022-12-13 MED ORDER — ZOLPIDEM TARTRATE 5 MG PO TABS: 5.0000 mg | ORAL_TABLET | Freq: Every evening | ORAL | Status: DC | PRN

## 2022-12-13 MED ORDER — DIPHENHYDRAMINE HCL 50 MG/ML IJ SOLN: 12.5000 mg | INTRAMUSCULAR | Status: DC | PRN

## 2022-12-13 MED ORDER — BENZOCAINE-MENTHOL 20-0.5 % EX AERO: 1.0000 | INHALATION_SPRAY | CUTANEOUS | Status: DC | PRN

## 2022-12-13 MED ORDER — SOD CITRATE-CITRIC ACID 500-334 MG/5ML PO SOLN: 30.0000 mL | ORAL | Status: DC | PRN

## 2022-12-13 MED ORDER — LIDOCAINE HCL (PF) 1 % IJ SOLN: 30.0000 mL | INTRAMUSCULAR | Status: DC | PRN

## 2022-12-13 MED ORDER — PRENATAL MULTIVITAMIN CH
1.0000 | ORAL_TABLET | Freq: Every day | ORAL | Status: DC
  Administered 2022-12-13 – 2022-12-14 (×2): 1 via ORAL
  Filled 2022-12-13 (×2): qty 1

## 2022-12-13 MED ORDER — OXYTOCIN-SODIUM CHLORIDE 30-0.9 UT/500ML-% IV SOLN
2.5000 [IU]/h | INTRAVENOUS | Status: DC
  Administered 2022-12-13: 2.5 [IU]/h via INTRAVENOUS
  Filled 2022-12-13: qty 500

## 2022-12-13 MED ORDER — OXYCODONE-ACETAMINOPHEN 5-325 MG PO TABS: 2.0000 | ORAL_TABLET | ORAL | Status: DC | PRN

## 2022-12-13 MED ORDER — SODIUM CHLORIDE 0.9 % IV SOLN: 1.0000 g | INTRAVENOUS | Status: DC

## 2022-12-13 MED ORDER — ACETAMINOPHEN 325 MG PO TABS: 650.0000 mg | ORAL_TABLET | ORAL | Status: DC | PRN

## 2022-12-13 MED ORDER — OXYTOCIN BOLUS FROM INFUSION
333.0000 mL | Freq: Once | INTRAVENOUS | Status: AC
  Administered 2022-12-13: 333 mL via INTRAVENOUS

## 2022-12-13 MED ORDER — PHENYLEPHRINE 80 MCG/ML (10ML) SYRINGE FOR IV PUSH (FOR BLOOD PRESSURE SUPPORT): 80.0000 ug | PREFILLED_SYRINGE | INTRAVENOUS | Status: DC | PRN

## 2022-12-13 MED ORDER — EPHEDRINE 5 MG/ML INJ: 10.0000 mg | INTRAVENOUS | Status: DC | PRN

## 2022-12-13 MED ORDER — IBUPROFEN 600 MG PO TABS
600.0000 mg | ORAL_TABLET | Freq: Four times a day (QID) | ORAL | Status: DC
  Administered 2022-12-13 – 2022-12-15 (×9): 600 mg via ORAL
  Filled 2022-12-13 (×9): qty 1

## 2022-12-13 MED ORDER — DIPHENHYDRAMINE HCL 25 MG PO CAPS: 25.0000 mg | ORAL_CAPSULE | Freq: Four times a day (QID) | ORAL | Status: DC | PRN

## 2022-12-13 MED ORDER — SODIUM CHLORIDE 0.9 % IV SOLN
2.0000 g | Freq: Once | INTRAVENOUS | Status: AC
  Administered 2022-12-13: 2 g via INTRAVENOUS
  Filled 2022-12-13: qty 2000

## 2022-12-13 MED ORDER — ONDANSETRON HCL 4 MG/2ML IJ SOLN: 4.0000 mg | Freq: Four times a day (QID) | INTRAMUSCULAR | Status: DC | PRN

## 2022-12-13 MED ORDER — SIMETHICONE 80 MG PO CHEW: 80.0000 mg | CHEWABLE_TABLET | ORAL | Status: DC | PRN

## 2022-12-13 MED ORDER — COCONUT OIL OIL
1.0000 | TOPICAL_OIL | Status: DC | PRN
  Administered 2022-12-14: 1 via TOPICAL

## 2022-12-13 MED ORDER — WITCH HAZEL-GLYCERIN EX PADS: 1.0000 | MEDICATED_PAD | CUTANEOUS | Status: DC | PRN

## 2022-12-13 NOTE — Anesthesia Preprocedure Evaluation (Signed)
Anesthesia Evaluation  Patient identified by MRN, date of birth, ID band Patient awake    Reviewed: Allergy & Precautions, Patient's Chart, lab work & pertinent test results  Airway Mallampati: II  TM Distance: >3 FB Neck ROM: Full    Dental no notable dental hx.    Pulmonary neg pulmonary ROS, Patient abstained from smoking., former smoker   Pulmonary exam normal breath sounds clear to auscultation       Cardiovascular hypertension (chronic HTN, no meds), Normal cardiovascular exam Rhythm:Regular Rate:Normal     Neuro/Psych  PSYCHIATRIC DISORDERS Anxiety Depression    negative neurological ROS     GI/Hepatic negative GI ROS, Neg liver ROS,,,  Endo/Other  negative endocrine ROS    Renal/GU negative Renal ROS  negative genitourinary   Musculoskeletal negative musculoskeletal ROS (+)    Abdominal   Peds negative pediatric ROS (+)  Hematology  (+) Blood dyscrasia, anemia Hb 10.3, plt 279   Anesthesia Other Findings   Reproductive/Obstetrics (+) Pregnancy                             Anesthesia Physical Anesthesia Plan  ASA: 2  Anesthesia Plan: Epidural   Post-op Pain Management:    Induction:   PONV Risk Score and Plan: 2  Airway Management Planned: Natural Airway  Additional Equipment: None  Intra-op Plan:   Post-operative Plan:   Informed Consent: I have reviewed the patients History and Physical, chart, labs and discussed the procedure including the risks, benefits and alternatives for the proposed anesthesia with the patient or authorized representative who has indicated his/her understanding and acceptance.       Plan Discussed with:   Anesthesia Plan Comments:        Anesthesia Quick Evaluation

## 2022-12-13 NOTE — Anesthesia Procedure Notes (Signed)
Epidural Patient location during procedure: OB Start time: 12/13/2022 4:26 AM End time: 12/13/2022 4:34 AM  Staffing Anesthesiologist: Lannie Fields, DO Performed: anesthesiologist   Preanesthetic Checklist Completed: patient identified, IV checked, risks and benefits discussed, monitors and equipment checked, pre-op evaluation and timeout performed  Epidural Patient position: sitting Prep: DuraPrep and site prepped and draped Patient monitoring: continuous pulse ox, blood pressure, heart rate and cardiac monitor Approach: midline Location: L3-L4 Injection technique: LOR air  Needle:  Needle type: Tuohy  Needle gauge: 17 G Needle length: 9 cm Needle insertion depth: 5.5 cm Catheter type: closed end flexible Catheter size: 19 Gauge Catheter at skin depth: 11 cm Test dose: negative  Assessment Sensory level: T8 Events: blood not aspirated, no cerebrospinal fluid, injection not painful, no injection resistance, no paresthesia and negative IV test  Additional Notes Patient identified. Risks/Benefits/Options discussed with patient including but not limited to bleeding, infection, nerve damage, paralysis, failed block, incomplete pain control, headache, blood pressure changes, nausea, vomiting, reactions to medication both or allergic, itching and postpartum back pain. Confirmed with bedside nurse the patient's most recent platelet count. Confirmed with patient that they are not currently taking any anticoagulation, have any bleeding history or any family history of bleeding disorders. Patient expressed understanding and wished to proceed. All questions were answered. Sterile technique was used throughout the entire procedure. Please see nursing notes for vital signs. Test dose was given through epidural catheter and negative prior to continuing to dose epidural or start infusion. Warning signs of high block given to the patient including shortness of breath, tingling/numbness in  hands, complete motor block, or any concerning symptoms with instructions to call for help. Patient was given instructions on fall risk and not to get out of bed. All questions and concerns addressed with instructions to call with any issues or inadequate analgesia.  Reason for block:procedure for pain

## 2022-12-13 NOTE — Discharge Summary (Signed)
Postpartum Discharge Summary  Date of Service updated***     Patient Name: Carrie Mckenzie DOB: 05-15-1991 MRN: [id number]  Date of admission: 12/13/2022 Delivery date:12/13/2022  Delivering provider:   Date of discharge: 12/13/2022  Admitting diagnosis: Indication for care in labor or delivery [O75.9] Intrauterine pregnancy: [redacted]w[redacted]d     Secondary diagnosis:  Principal Problem:   Indication for care in labor or delivery Active Problems:   Rubella non-immune status, antepartum   Essential (primary) hypertension   Supervision of high-risk pregnancy  Additional problems: ***    Discharge diagnosis: Preterm Pregnancy Delivered                                              Post partum procedures:{Postpartum procedures:23558} Augmentation:  None Complications: None  Hospital course: Onset of Labor With Vaginal Delivery      32 y.o. yo [id number] at [redacted]w[redacted]d was admitted in Active Labor on 12/13/2022. Labor course was uncomplicated. Membrane Rupture Time/Date: 5:18 AM ,12/13/2022   Delivery Method:  Episiotomy:   Lacerations:    Patient had a postpartum course complicated by ***.  She is ambulating, tolerating a regular diet, passing flatus, and urinating well. Patient is discharged home in stable condition on 12/13/22.  Newborn Data: Birth date:12/13/2022  Birth time:7:54 AM  Gender:Female  Living status:  Apgars: ,  Weight:   Magnesium Sulfate received: No BMZ received: No Rhophylac:N/A MMR:Yes T-DaP: ordered postpartum Flu: N/A Transfusion:{Transfusion received:30440034}  Physical exam  Vitals:   12/13/22 0600 12/13/22 0631 12/13/22 0702 12/13/22 0725  BP: 116/60 130/76 121/67   Pulse: 70 73 71   Resp: 16 16 16    Temp:    97.9 F (36.6 C)  TempSrc:    Oral  SpO2:      Weight:      Height:       General: {Exam; general:21111117} Lochia: {Desc; appropriate/inappropriate:30686::"appropriate"} Uterine Fundus: {Desc; firm/soft:30687} Incision: {Exam; incision:21111123} DVT  Evaluation: {Exam; dvt:2111122} Labs: Lab Results  Component Value Date   WBC 14.4 (H) 12/13/2022   HGB 10.3 (L) 12/13/2022   HCT 32.0 (L) 12/13/2022   MCV 77.1 (L) 12/13/2022   PLT 279 12/13/2022      Latest Ref Rng & Units 07/07/2022    4:11 PM  CMP  Glucose 70 - 99 mg/dL 96   BUN 6 - 20 mg/dL 6   Creatinine 1.47 - 8.29 mg/dL 5.62   Sodium 130 - 865 mmol/L 136   Potassium 3.5 - 5.2 mmol/L 3.5   Chloride 96 - 106 mmol/L 99   CO2 20 - 29 mmol/L 21   Calcium 8.7 - 10.2 mg/dL 9.4   Total Protein 6.0 - 8.5 g/dL 7.3   Total Bilirubin 0.0 - 1.2 mg/dL <7.8   Alkaline Phos 44 - 121 IU/L 62   AST 0 - 40 IU/L 12   ALT 0 - 32 IU/L 5    Edinburgh Score:    08/18/2017    3:09 PM  Edinburgh Postnatal Depression Scale Screening Tool  I have been able to laugh and see the funny side of things. 0  I have looked forward with enjoyment to things. 0  I have blamed myself unnecessarily when things went wrong. 0  I have been anxious or worried for no good reason. 0  I have felt scared or panicky for no good reason. 0  Things have been getting on top of me. 0  I have been so unhappy that I have had difficulty sleeping. 0  I have felt sad or miserable. 0  I have been so unhappy that I have been crying. 0  The thought of harming myself has occurred to me. 0  Edinburgh Postnatal Depression Scale Total 0     After visit meds:  Allergies as of 12/13/2022   No Known Allergies   Med Rec must be completed prior to using this [facility]***        Discharge home in stable condition Infant Feeding: {Baby feeding:23562} Infant Disposition:{CHL IP OB HOME WITH ZOXWRU:04540} Discharge instruction: per After Visit Summary and Postpartum booklet. Activity: Advance as tolerated. Pelvic rest for 6 weeks.  Diet: {OB JWJX:91478295} Future Appointments: Future Appointments  Date Time Provider Department Center  12/17/2022  9:15 AM [facility] - [facility] [place] [facility] None  12/17/2022 10:10 AM Lazaro Arms, MD [facility]  12/24/2022  8:30 AM [facility] [place] [facility] None  12/24/2022  9:50 AM Myna Hidalgo, DO [facility]  12/31/2022  8:30 AM [facility] [place] [facility] None  12/31/2022  9:30 AM Arabella Merles, CNM [facility]  01/07/2023 10:00 AM [facility] [place] [facility] None  01/07/2023 10:50 AM Myna Hidalgo, DO [facility]   Follow up Visit: Message sent   Please schedule this patient for a In person postpartum visit in 6 weeks with the following provider: Any provider. Additional Postpartum F/U:BP check 1 week  High risk pregnancy complicated by: HTN Delivery mode:    Anticipated Birth Control:  POPs   12/13/2022 Celedonio Savage, MD

## 2022-12-13 NOTE — MAU Note (Signed)
.  Carrie Mckenzie is a 32 y.o. at [redacted]w[redacted]d here in MAU reporting:   Contractions every: 2-3 minutes Onset of ctx: Yesterday Pain score: 9/10  ROM: Intact Vaginal Bleeding: Bloody show Last SVE: none  Epidural: Undecided   Fetal Movement: Reports positive FM FHT:via Patient taken straight to room   OB Office: Faculty

## 2022-12-13 NOTE — Lactation Note (Addendum)
This note was copied from a baby's chart. Lactation Consultation Note  Patient Name: Boy Kameah Thresher [id number] Date: 12/13/2022 Age:34 hours Reason for consult: Initial assessment;Late-preterm 34-36.6wks.  P4, LPTI that is breast and formula feeding. Per Birth Parent infant BF twice initially for 15 minutes and 2nd time 5 minutes or less. Infant is consuming 20 to 25 mls of 22 kcal formula per feedings. Birth Parent does want to BF infant and supplement infant with formula. LC taught hand expression with breast model and Birth Parent self express but colostrum not present with hand expression currently. Birth Parent was set up with DEBP fitted with 24 mm breast flange, Birth Parent was pumping when LC left the room. Birth Parent briefly BF previous children the longest was 2 weeks postpartum. LC discussed infant's input and output. The importance of maternal rest, diet and hydration. Birth Parent was made aware of O/P services, breastfeeding support groups, community resources, and our phone # for post-discharge questions.    Birth Parent understands that EBM is safe at room temperature for 4 hours whereas formula must be used within 1 hour once opened.   Current feeding plan: 1- Birth Parent will follow LPTI feeding policy, Latch infant at breast with limit chest feeding 15 minutes or less and supplement infant with each feeding any EBM first and then 22 kcal formula. Total feedings chest/ bottle should be 30 minutes or less. Birth Parent will continue to BF / bottle feed infant every 3 hours. 2- Birth Parent knows to call RN/LC for latch assistance if needed.  3-Birth Parent will continue to use DEBP every 3 hours for 15 minutes on initial setting and will give infant back any EBM first before formula. Maternal Data Has patient been taught Hand Expression?: Yes Does the patient have breastfeeding experience prior to this delivery?: Yes How long did the patient breastfeed?: Per Birth Parent, did  not BF 1st child, 2nd child 2 weeks and third child only 2 days.  Feeding Mother's Current Feeding Choice: Breast Milk and Formula  LATCH Score  LC did not observe latch due infant recently being formula feed.                   Lactation Tools Discussed/Used Tools: Pump;Flanges Flange Size: 24 Breast pump type: Double-Electric Breast Pump Pump Education: Setup, frequency, and cleaning;Milk Storage Reason for Pumping: infant is LPTI, pumping to help stimulate and establish Birth Parent's milk supply. Pumping frequency: Plans to continue to pump every 3 hours for 15 minutes on inital setting.  Interventions Interventions: Breast feeding basics reviewed;Position options;Skin to skin;Education;LPT handout/interventions;LC Services brochure;Pace feeding;Breast massage;Hand express  Discharge Pump: DEBP (Per Birth Parent, she has DEBP at home.)  Consult Status Consult Status: Follow-up Date: 12/14/22 Follow-up type: In-patient    Frederico Hamman 12/13/2022, 6:48 PM

## 2022-12-13 NOTE — H&P (Signed)
OBSTETRIC ADMISSION HISTORY AND PHYSICAL  Carrie Mckenzie is a 32 y.o. female [phone] with IUP at [redacted]w[redacted]d by LMP presenting for SOL. She reports +FMs, No LOF, no VB, no blurry vision, headaches or peripheral edema, and RUQ pain.  She plans on breast and formula feeding. She request POPs for birth control. She received her prenatal care at [facility]   Dating: By LMP --->  Estimated Date of Delivery: 01/08/23  Sono:    @[redacted]w[redacted]d , CWD, normal anatomy, cephalic presentation,  posterior placenta, 2257g, 39% EFW   Prenatal History/Complications: cHTN  Past Medical History: Past Medical History:  Diagnosis Date   Anxiety    BV (bacterial vaginosis) 12/17/2013   Depression    Hypertension    Ovarian cyst    Vaginal discharge 12/17/2013    Past Surgical History: Past Surgical History:  Procedure Laterality Date   CERVICAL ABLATION N/A 04/02/2020   Procedure: LASER ABLATION OF CERVIX;  Surgeon: Lazaro Arms, MD;  Location: [facility];  Service: Gynecology;  Laterality: N/A;   CHOLECYSTECTOMY     WISDOM TOOTH EXTRACTION      Obstetrical History: OB History     Gravida  5   Para  3   Term  3   Preterm  0   AB  1   Living  3      SAB  1   IAB  0   Ectopic  0   Multiple  0   Live Births  3           Social History Social History   Socioeconomic History   Marital status: Significant Other    Spouse name: Not on file   Number of children: Not on file   Years of education: Not on file   Highest education level: Not on file  Occupational History   Not on file  Tobacco Use   Smoking status: Former    Packs/day: 0.50    Years: 10.00    Additional pack years: 0.00    Total pack years: 5.00    Types: Cigarettes   Smokeless tobacco: Never  Vaping Use   Vaping Use: Every day  Substance and Sexual Activity   Alcohol use: Not Currently    Comment: weekends   Drug use: No   Sexual activity: Yes    Birth control/protection: None  Other Topics Concern   Not on  file  Social History Narrative   Not on file   Social Determinants of Health   Financial Resource Strain: Low Risk  (07/07/2022)   Overall Financial Resource Strain (CARDIA)    Difficulty of Paying Living Expenses: Not very hard  Food Insecurity: No Food Insecurity (07/07/2022)   Hunger Vital Sign    Worried About Running Out of Food in the Last Year: Never true    Ran Out of Food in the Last Year: Never true  Transportation Needs: No Transportation Needs (07/07/2022)   PRAPARE - Administrator, Civil Service (Medical): No    Lack of Transportation (Non-Medical): No  Physical Activity: Insufficiently Active (07/07/2022)   Exercise Vital Sign    Days of Exercise per Week: 3 days    Minutes of Exercise per Session: 20 min  Stress: No Stress Concern Present (07/07/2022)   [employer] of Occupational Health - Occupational Stress Questionnaire    Feeling of Stress : Only a little  Social Connections: Socially Isolated (07/07/2022)   Social Connection and Isolation Panel [NHANES]    Frequency  of Communication with Friends and Family: Twice a week    Frequency of Social Gatherings with Friends and Family: Once a week    Attends Religious Services: Never    Database administrator or Organizations: No    Attends Engineer, structural: Never    Marital Status: Never married    Family History: Family History  Problem Relation Age of Onset   Cancer Maternal Grandmother        ovarian, uterine   COPD Maternal Grandfather    Emphysema Maternal Grandfather    Hypertension Father    Cancer Father        prostate   Depression Father    Anxiety disorder Father    Cirrhosis Father    COPD Father    Kidney disease Father    Diabetes Mother    Thyroid disease Mother        had thyroid removed   Hypertension Mother    Heart attack Mother    ADD / ADHD Daughter    Diabetes Maternal Aunt    Diabetes Maternal Aunt    Diabetes Maternal Aunt    Diabetes  Maternal Uncle    Diabetes Maternal Uncle    Diabetes Maternal Uncle    Diabetes Maternal Uncle    Diabetes Maternal Uncle    Diabetes Maternal Uncle    Diabetes Maternal Uncle    Asthma Paternal Aunt     Allergies: No Known Allergies  Medications Prior to Admission  Medication Sig Dispense Refill Last Dose   aspirin 81 MG chewable tablet Chew 2 tablets (162 mg total) by mouth daily. (Patient not taking: Reported on 08/06/2022) 60 tablet 7    VITAMIN D PO Take 2,000 Units by mouth.        Review of Systems   All systems reviewed and negative except as stated in HPI  Blood pressure 126/69, pulse 84, temperature 98.3 F (36.8 C), temperature source Oral, resp. rate 17, height 5\' 9"  (1.753 m), weight 73 kg, last menstrual period 04/03/2022, SpO2 100 %. General appearance: alert, cooperative, and moderate distress Lungs: clear to auscultation bilaterally Heart: regular rate  Abdomen: soft, non-tender; bowel sounds normal  Extremities: Homans sign is negative, no sign of DVT  Presentation: cephalic Fetal monitoringBaseline: 135 bpm, Variability: Good {> 6 bpm), Accelerations: Reactive, and Decelerations: Absent Uterine activityFrequency: Every 3-5 minutes Dilation: 6 Effacement (%): 90 Station: -1 Exam by:: Fabiola Backer, RN   Prenatal labs: ABO, Rh: --/--/PENDING (05/06 0337) Antibody: PENDING (05/06 0337) Rubella: 1.41 (11/29 1611) RPR: Non Reactive (03/14 0853)  HBsAg: Negative (11/29 1611)  HIV: Non Reactive (03/14 0853)  GBS:    1 hr Glucola Normal Genetic screening  LR Anatomy US normal  Prenatal Transfer Tool  Maternal Diabetes: No Genetic Screening: Normal Maternal Ultrasounds/Referrals: Normal Fetal Ultrasounds or other Referrals:  Referred to Materal Fetal Medicine  Maternal Substance Abuse:  Yes:  Type: Smoker Significant Maternal Medications:  None Significant Maternal Lab Results:  Other: GBS unknown Number of Prenatal Visits:greater than 3  verified prenatal visits Other Comments:  None  Results for orders placed or performed during the hospital encounter of 12/13/22 (from the past 24 hour(s))  Type and screen [facility]   Collection Time: 12/13/22  3:37 AM  Result Value Ref Range   ABO/RH(D) PENDING    Antibody Screen PENDING    Sample Expiration      12/16/2022,2359 Performed at [facility], [address] [place],  [place] [address]   CBC   Collection Time: 12/13/22  3:38 AM  Result Value Ref Range   WBC 14.4 (H) 4.0 - 10.5 K/uL   RBC 4.15 3.87 - 5.11 MIL/uL   Hemoglobin 10.3 (L) 12.0 - 15.0 g/dL   HCT 60.4 (L) 54.0 - 98.1 %   MCV 77.1 (L) 80.0 - 100.0 fL   MCH 24.8 (L) 26.0 - 34.0 pg   MCHC 32.2 30.0 - 36.0 g/dL   RDW 19.1 47.8 - 29.5 %   Platelets 279 150 - 400 K/uL   nRBC 0.0 0.0 - 0.2 %    Patient Active Problem List   Diagnosis Date Noted   Indication for care in labor or delivery 12/13/2022   Facial paresthesia 06/16/2022   Essential (primary) hypertension 06/03/2022   Supervision of high-risk pregnancy 06/03/2022   Rubella non-immune status, antepartum 08/28/2013   Anxiety 08/27/2013    Assessment/Plan:  Carrie Mckenzie is a 32 y.o. [id number] at [redacted]w[redacted]d here forSOL  #Labor:Progressing spontaneously, will continue to monitor #Pain: Patient requesting epidural  #FWB: Cat 1 #ID:  Gbs unknown, Ampicillin #MOF: both #MOC:POPs #Circ:  Yes cHTN: BP wnl  Celedonio Savage, MD  12/13/2022, 4:13 AM

## 2022-12-14 NOTE — Progress Notes (Signed)
POSTPARTUM PROGRESS NOTE  Post Partum Day 1  Subjective:  Carrie Mckenzie is a 32 y.o. [id number] s/p VD at [redacted]w[redacted]d.  She reports she is doing well. No acute events overnight. She denies any problems with ambulating, voiding or po intake. Denies nausea or vomiting.  Pain is well controlled.  Lochia is adequate.  Objective: Blood pressure 127/61, pulse 64, temperature 98 F (36.7 C), temperature source Oral, resp. rate (!) 49, height 5\' 9"  (1.753 m), weight 73 kg, last menstrual period 04/03/2022, SpO2 100 %, unknown if currently breastfeeding.  Physical Exam:  General: alert, cooperative and no distress Chest: no respiratory distress Heart:regular rate, distal pulses intact Uterine Fundus: firm, appropriately tender DVT Evaluation: No calf swelling or tenderness Extremities: no edema Skin: warm, dry  Recent Labs    12/13/22 0338  HGB 10.3*  HCT 32.0*    Assessment/Plan: Carrie Mckenzie is a 32 y.o. [id number] s/p VD at [redacted]w[redacted]d   PPD#1 - Doing well  Routine postpartum care Contraception: POPs Feeding: breast Dispo: Plan for discharge tomorrow.   LOS: 1 day   Myrtie Hawk, DO OB Fellow  12/14/2022, 3:53 PM

## 2022-12-14 NOTE — Anesthesia Postprocedure Evaluation (Signed)
Anesthesia Post Note  Patient: Carrie Mckenzie  Procedure(s) Performed: AN AD HOC LABOR EPIDURAL     Patient location during evaluation: Mother Baby Anesthesia Type: Epidural Level of consciousness: awake, awake and alert and oriented Pain management: pain level controlled Vital Signs Assessment: post-procedure vital signs reviewed and stable Respiratory status: spontaneous breathing, nonlabored ventilation and respiratory function stable Cardiovascular status: blood pressure returned to baseline and stable Postop Assessment: no headache, no backache, no apparent nausea or vomiting, able to ambulate and adequate PO intake Anesthetic complications: no   No notable events documented.  Last Vitals:  Vitals:   12/13/22 2152 12/14/22 0546  BP: 112/67 (!) 94/50  Pulse: 73 (!) 58  Resp: 16 (!) 49  Temp: 36.6 C 36.7 C  SpO2:      Last Pain:  Vitals:   12/14/22 0546  TempSrc: Oral  PainSc:    Pain Goal:                   Carrie Mckenzie

## 2022-12-14 NOTE — Lactation Note (Signed)
This note was copied from a baby's chart. Lactation Consultation Note  Patient Name: Carrie Mckenzie [id number] Date: 12/14/2022 Age:34 hours Reason for consult: 1st time breastfeeding;Late-preterm 34-36.6wks;Infant weight loss (weight loss -2.05%. Birth Parent is BF, pumping and supplementing infant with 22 kcal formula.)  LC entered the room infant was being pace feed by older sibling while Birth Parent was using the DEBP. Per Birth Parent, she is starting to see colostrum when pumping now and she has pumped 3 times today. She plans to latch infant at  the next feeding and feels that infant latches well at the breast and knows to limit feedings at the breast to 15 minutes. Infant is doing well with bottle feeding and now consuming 30 to 33 mls of formula per feedings. Per Birth Parent, infant had 7 voids and 6 stools since birth.   Birth Parent knows that her EBM is safe for 4 hours whereas formula must be used within 1 hour at room temperature when opened.   Birth Parent will continue to follow LPTI feeding guidelines.  1- Continue to feed infant every 3 hours  and limit total feedings to 30 minutes or less when breast ( chest feeding) and supplementing with formula. 2- Birth Parent knows LPTI feeding guidelines on Day 2 of life to supplement infant with 19-21 mls of EBM/22 kcal formula or more if infant wants it. 3- Continue to use DEBP every 3 hours for 15 minutes on initial setting.   Maternal Data    Feeding Mother's Current Feeding Choice: Breast Milk and Formula Nipple Type: Slow - flow  LATCH Score                    Lactation Tools Discussed/Used    Interventions Interventions: Education;DEBP;Pace feeding;LPT handout/interventions  Discharge    Consult Status Consult Status: Follow-up Date: 12/15/22 Follow-up type: In-patient    Frederico Hamman 12/14/2022, 5:34 PM

## 2022-12-14 NOTE — Progress Notes (Signed)
MOB was referred for history of depression/anxiety.  * Referral screened out by Clinical Social Worker because none of the following criteria appear to apply:  ~ History of anxiety/depression during this pregnancy, or of post-partum depression following prior delivery.  ~ Diagnosis of anxiety and/or depression within last 3 years  Per OB notes, MOB did not indicate any signs/symptoms during pregnancy.  OR  * MOB's symptoms currently being treated with medication and/or therapy.  Please contact the Clinical Social Worker if needs arise, by MOB request, or if MOB scores greater than 9/yes to question 10 on Edinburgh Postpartum Depression Screen.   Celines Femia, LCSWA Clinical Social Worker [phone] 

## 2022-12-15 ENCOUNTER — Ambulatory Visit (HOSPITAL_COMMUNITY): Payer: Self-pay

## 2022-12-15 MED ORDER — SLYND 4 MG PO TABS: 1.0000 | ORAL_TABLET | Freq: Every day | ORAL | 3 refills | Status: DC

## 2022-12-15 MED ORDER — IBUPROFEN 600 MG PO TABS: 600.0000 mg | ORAL_TABLET | Freq: Four times a day (QID) | ORAL | 0 refills | Status: AC | PRN

## 2022-12-15 NOTE — Lactation Note (Signed)
This note was copied from a baby's chart. Lactation Consultation Note  Patient Name: Carrie Mckenzie [id number] Date: 12/15/2022 Age:34 hours Reason for consult: Follow-up assessment;Late-preterm 34-36.6wks  LC in to room prior to discharge. Lactating parent (LP) reports good feedings with formula. Parent stated she has not pumped in the last 24h. LC encouraged pumping at least 8 times in 24h for stimulation and supplementation.  Discussed normal newborn behavior and patterns in addition to clusterfeeding. Talked about milk coming into volume.  Encouraged LP rest, hydration and food intake.  Contact LC as needed for feeds/support/concerns/questions. All questions answered at this time. Reviewed resources available in the community. LC will fax a [facility] referral on behalf of dyad.   Feeding Mother's Current Feeding Choice: Breast Milk and Formula Nipple Type: Extra Slow Flow  LATCH Score No latch observed during this encounter.   Lactation Tools Discussed/Used Breast pump type: Double-Electric Breast Pump;Manual Reason for Pumping: LPTI Pumping frequency: at least 8x24h  Discharge Discharge Education: Engorgement and breast care;Warning signs for feeding baby Pump: DEBP;Manual;Hands Free  Consult Status Consult Status: Complete Date: 12/15/22 Follow-up type: Call as needed    Presly Steinruck A Higuera Ancidey 12/15/2022, 12:22 PM

## 2022-12-15 NOTE — Discharge Instructions (Signed)
Call the office ([phone]) [facility] for [facility] signs of pre-ecl[facility]:  Severe headache that does not go away with Tylenol  Visual changes- seeing spots, double, blurred vision  Pain under your right breast or upper abdomen that does not go away with Tums or heartburn medicine  Nausea and/or vomiting  Severe swelling in your hands, feet, and face       

## 2022-12-17 ENCOUNTER — Encounter: Payer: No Typology Code available for payment source | Admitting: Obstetrics & Gynecology

## 2022-12-17 ENCOUNTER — Other Ambulatory Visit: Payer: No Typology Code available for payment source

## 2022-12-20 ENCOUNTER — Ambulatory Visit (INDEPENDENT_AMBULATORY_CARE_PROVIDER_SITE_OTHER): Payer: No Typology Code available for payment source | Admitting: *Deleted

## 2022-12-20 ENCOUNTER — Encounter: Payer: No Typology Code available for payment source | Admitting: Advanced Practice Midwife

## 2022-12-20 ENCOUNTER — Other Ambulatory Visit: Payer: No Typology Code available for payment source

## 2022-12-20 VITALS — BP 146/84 | HR 64 | Wt 155.0 lb

## 2022-12-20 DIAGNOSIS — I10 Essential (primary) hypertension: Secondary | ICD-10-CM

## 2022-12-20 MED ORDER — NIFEDIPINE ER OSMOTIC RELEASE 30 MG PO TB24: 30.0000 mg | ORAL_TABLET | Freq: Every day | ORAL | 2 refills | Status: DC

## 2022-12-20 NOTE — Progress Notes (Addendum)
   NURSE VISIT- BLOOD PRESSURE CHECK  SUBJECTIVE:  Carrie Mckenzie is a 32 y.o. [phone] female here for BP check. She is postpartum, delivery date 12/13/22     HYPERTENSION ROS:  Postpartum:  Severe headaches that don't go away with tylenol/other medicines: No  Visual changes (seeing spots/double/blurred vision) No  Severe pain under right breast breast or in center of upper chest No  Severe nausea/vomiting No  Taking medicines as instructed not applicable  OBJECTIVE:  BP (!) 146/84 (BP Location: Left Arm, Patient Position: Sitting, Cuff Size: Normal)   Pulse 64   Wt 155 lb (70.3 kg)   LMP 04/03/2022 (Approximate)   Breastfeeding Yes   BMI 22.89 kg/m   Appearance alert, well appearing, and in no distress.  ASSESSMENT: Postpartum  blood pressure check  PLAN: Discussed with Joellyn Haff, CNM, [facility]   Recommendations: new prescription will be sent   Follow-up:  1wk mychart bp check    Jobe Marker  12/20/2022 11:40 AM  Chart reviewed for nurse visit. Agree with plan of care. 1st bp 136/86, repeat 146/84. H/O CHTN, no meds currently. Rx nifedipine 30mg  daily, f/u 1wk for bp check w/ nurse- take meds at least 1hr before visit.  Cheral Marker, [place] 12/20/2022 12:21 PM

## 2022-12-20 NOTE — Addendum Note (Signed)
Addended by: Shawna Clamp R on: 12/20/2022 12:22 PM   Modules accepted: Orders

## 2022-12-24 ENCOUNTER — Other Ambulatory Visit: Payer: No Typology Code available for payment source

## 2022-12-24 ENCOUNTER — Encounter: Payer: No Typology Code available for payment source | Admitting: Obstetrics & Gynecology

## 2022-12-24 ENCOUNTER — Encounter: Payer: Self-pay | Admitting: *Deleted

## 2022-12-27 ENCOUNTER — Encounter: Payer: No Typology Code available for payment source | Admitting: Obstetrics & Gynecology

## 2022-12-27 ENCOUNTER — Other Ambulatory Visit: Payer: No Typology Code available for payment source

## 2022-12-27 ENCOUNTER — Telehealth: Payer: No Typology Code available for payment source

## 2022-12-28 ENCOUNTER — Telehealth (INDEPENDENT_AMBULATORY_CARE_PROVIDER_SITE_OTHER): Payer: No Typology Code available for payment source | Admitting: *Deleted

## 2022-12-28 VITALS — BP 136/79 | HR 77

## 2022-12-28 DIAGNOSIS — I10 Essential (primary) hypertension: Secondary | ICD-10-CM

## 2022-12-28 DIAGNOSIS — Z0130 Encounter for examination of blood pressure without abnormal findings: Secondary | ICD-10-CM

## 2022-12-28 NOTE — Progress Notes (Signed)
   NURSE VISIT- BLOOD PRESSURE CHECK  SUBJECTIVE:  Carrie Mckenzie is a 32 y.o. [phone] female here for BP check. She is postpartum, delivery date 12/13/22     HYPERTENSION ROS:  Postpartum:  Severe headaches that don't go away with tylenol/other medicines: No  Visual changes (seeing spots/double/blurred vision) No  Severe pain under right breast breast or in center of upper chest No  Severe nausea/vomiting No  Taking medicines as instructed has been taking it but has not taken in the last 2 days as she has been at her mother's house and did not take her medication with her. Plans to pick up today.    OBJECTIVE:  BP 136/79 (BP Location: Right Arm, Patient Position: Sitting, Cuff Size: Normal)   Pulse 77   LMP 04/03/2022 (Approximate)   Breastfeeding Yes   Appearance alert, well appearing, and in no distress.  ASSESSMENT: Postpartum  blood pressure check  PLAN: Discussed with Joellyn Haff, CNM, [facility]   Recommendations: stop medicine 2 days before next visit   Follow-up: as scheduled   Jobe Marker  12/28/2022 3:13 PM

## 2022-12-31 ENCOUNTER — Encounter: Payer: No Typology Code available for payment source | Admitting: Advanced Practice Midwife

## 2022-12-31 ENCOUNTER — Other Ambulatory Visit: Payer: No Typology Code available for payment source

## 2023-01-04 ENCOUNTER — Encounter: Payer: No Typology Code available for payment source | Admitting: Obstetrics & Gynecology

## 2023-01-04 ENCOUNTER — Other Ambulatory Visit: Payer: No Typology Code available for payment source

## 2023-01-05 ENCOUNTER — Encounter: Payer: Self-pay | Admitting: *Deleted

## 2023-01-07 ENCOUNTER — Encounter: Payer: No Typology Code available for payment source | Admitting: Obstetrics & Gynecology

## 2023-01-07 ENCOUNTER — Other Ambulatory Visit: Payer: No Typology Code available for payment source

## 2023-01-10 ENCOUNTER — Other Ambulatory Visit: Payer: No Typology Code available for payment source

## 2023-01-24 ENCOUNTER — Encounter: Payer: Self-pay | Admitting: Women's Health

## 2023-01-24 ENCOUNTER — Ambulatory Visit (INDEPENDENT_AMBULATORY_CARE_PROVIDER_SITE_OTHER): Payer: No Typology Code available for payment source | Admitting: Women's Health

## 2023-01-24 DIAGNOSIS — Z8751 Personal history of pre-term labor: Secondary | ICD-10-CM | POA: Diagnosis not present

## 2023-01-24 DIAGNOSIS — F419 Anxiety disorder, unspecified: Secondary | ICD-10-CM | POA: Diagnosis not present

## 2023-01-24 DIAGNOSIS — I10 Essential (primary) hypertension: Secondary | ICD-10-CM

## 2023-01-24 MED ORDER — VENLAFAXINE HCL ER 37.5 MG PO CP24: 37.5000 mg | ORAL_CAPSULE | Freq: Every day | ORAL | 0 refills | Status: DC

## 2023-01-24 MED ORDER — HYDROCHLOROTHIAZIDE 25 MG PO TABS: 25.0000 mg | ORAL_TABLET | Freq: Every day | ORAL | 0 refills | Status: AC

## 2023-01-24 NOTE — Progress Notes (Signed)
POSTPARTUM VISIT Patient name: Carrie Mckenzie MRN [id number]  Date of birth: 10-10-1990 Chief Complaint:   Postpartum Care  History of Present Illness:   Carrie Mckenzie is a 32 y.o. [id number] African American female being seen today for a postpartum visit. She is 6 weeks postpartum following a spontaneous vaginal delivery at 36.2 gestational weeks after spontaneous PTL. IOL: no, for n/a. Anesthesia: epidural.  Laceration: none.  Complications: none. Inpatient contraception: yes rx'd Slynd at d/c .   Pregnancy complicated by [facility]- no meds .  Tobacco use: no. Substance use disorder: no. Last pap smear: 07/13/21 and results were NILM w/ HRHPV not done. Next pap smear due: 2025 Patient's last menstrual period was 04/03/2022 (approximate).  Postpartum course has been complicated by HTN, started on nifedipine 30mg  daily 5/13, stopped 2d ago as directed, states even before she stopped her DBPs were still elevated at home . Was on hydrochlorothiazide 25mg  before pregnancy and it worked really well. Has appt w/ PCP 7/9 to restart all pre-pregnancy meds.  Bleeding none. Bowel function is normal. Bladder function is normal. Urinary incontinence? no, fecal incontinence? no Patient is sexually active. Last sexual activity:  did not discuss . Desired contraception: currently on Slynd. Patient does not know want a pregnancy in the future.  Desired family size is uncertain #of children.   Upstream - 01/24/23 1141       Pregnancy Intention Screening   Does the patient want to become pregnant in the next year? No    Does the patient's partner want to become pregnant in the next year? No    Would the patient like to discuss contraceptive options today? No      Contraception Wrap Up   Current Method Oral Contraceptive    End Method Oral Contraceptive    Contraception Counseling Provided No            The pregnancy intention screening data noted above was reviewed. Potential methods of contraception  were discussed. The patient elected to proceed with Oral Contraceptive.  Edinburgh Postpartum Depression Screening: negative, however she reports she is having a really hard time w/ anxiety. Scared to leave baby w/ anyone. Not sleeping well. Denies depression, SI/HI/II. Was on effexor 'lowest dosage' before trying to conceive, it worked well, would like to restart. Did have therapist in same office as PCP, but she left. Is interested in talking w/ someone.   Edinburgh Postnatal Depression Scale - 01/24/23 1130       Edinburgh Postnatal Depression Scale:  In the Past 7 Days   I have been able to laugh and see the funny side of things. 0    I have looked forward with enjoyment to things. 0    I have blamed myself unnecessarily when things went wrong. 0    I have been anxious or worried for no good reason. 2    I have felt scared or panicky for no good reason. 0    Things have been getting on top of me. 0    I have been so unhappy that I have had difficulty sleeping. 0    I have felt sad or miserable. 0    I have been so unhappy that I have been crying. 1    The thought of harming myself has occurred to me. 0    Edinburgh Postnatal Depression Scale Total 3                07/07/2022    2:49 PM  GAD 7 : Generalized Anxiety Score  Nervous, Anxious, on Edge 2  Control/stop worrying 2  Worry too much - different things 2  Trouble relaxing 2  Restless 1  Easily annoyed or irritable 3  Afraid - awful might happen 2  Total GAD 7 Score 14     Baby's course has been uncomplicated. Baby is feeding by bottle. Infant has a pediatrician/family doctor? Yes.  Childcare strategy if returning to work/school: daycare.  Pt has material needs met for her and baby: Yes.   Review of Systems:   Pertinent items are noted in HPI Denies Abnormal vaginal discharge w/ itching/odor/irritation, headaches, visual changes, shortness of breath, chest pain, abdominal pain, severe nausea/vomiting, or problems  with urination or bowel movements. Pertinent History Reviewed:  Reviewed past medical,surgical, obstetrical and family history.  Reviewed problem list, medications and allergies. OB History  Gravida Para Term Preterm AB Living  5 4 3 1 1 4   SAB IAB Ectopic Multiple Live Births  1 0 0 0 4    # Outcome Date GA Lbr Len/2nd Weight Sex Delivery Anes PTL Lv  5 Preterm 12/13/22 [redacted]w[redacted]d / 00:09 5 lb 14.5 oz (2.68 kg) M Vag-Spont   LIV  4 Term 07/06/17 [redacted]w[redacted]d 04:19 / 00:07 5 lb 12.4 oz (2.619 kg) F Vag-Spont EPI N LIV     Birth Comments: none     Complications: Postpartum hypertension  3 Term 04/02/14 [redacted]w[redacted]d 04:54 / 00:27 7 lb 5.1 oz (3.32 kg) M Vag-Spont EPI N LIV     Complications: Postpartum hypertension  2 SAB 2010          1 Term 01/09/08 [redacted]w[redacted]d  7 lb 6 oz (3.345 kg) F Vag-Spont EPI N LIV     Birth Comments: chorioamnionitis, baby w/ pneumonia in NICU x 1wk     Complications: Chorioamnionitis, Postpartum hypertension   Physical Assessment:   Vitals:   01/24/23 1126 01/24/23 1141  BP: (!) 167/86 (!) 165/91  Pulse: 73 79  Weight: 161 lb (73 kg)   Height: 5\' 6"  (1.676 m)   Body mass index is 25.99 kg/m.       Physical Examination:   General appearance: alert, well appearing, and in no distress; tearful when talking about anxiety  Mental status: alert, oriented to person, place, and time  Skin: warm & dry   Cardiovascular: normal heart rate noted   Respiratory: normal respiratory effort, no distress   Breasts: deferred, no complaints   Abdomen: soft, non-tender   Pelvic: examination not indicated. Thin prep pap obtained: No  Rectal: not examined  Extremities: Edema: none   Chaperone: N/A         No results found for this or any previous visit (from the past 24 hour(s)).  Assessment & Plan:  1) Postpartum exam 2) 6 wks s/p spontaneous vaginal delivery @ 36.2wk d/t spontaneous PTL 3) bottle feeding 4) Depression screening 5) Contraception counseling> continue Slynd, even though  no longer breastfeeding, would not recommend COC d/t bp 6) Anxiety> on effexor prior to pregnancy, rx sent to restart, IBH referral ordered. F/U w/ PCP as scheduled 7/9 7) CHTN> stopped nifedipine 30mg  2 day ago, home DBPs were still elevated while she was on it per her report today. Hydrochlorothiazide worked well in past, restarted today, F/U Thurs for bp check w/ nurse, then w/ PCP as scheduled 7/9  Essential components of care per ACOG recommendations:  1.  Mood and well being:  If positive depression screen, discussed and plan  developed.  If using tobacco we discussed reduction/cessation and risk of relapse If current substance abuse, we discussed and referral to local resources was offered.   2. Infant care and feeding:  If breastfeeding, discussed returning to work, pumping, breastfeeding-associated pain, guidance regarding return to fertility while lactating if not using another method. If needed, patient was provided with a letter to be allowed to pump q 2-3hrs to support lactation in a private location with access to a refrigerator to store breastmilk.   Recommended that all caregivers be immunized for flu, pertussis and other preventable communicable diseases If pt does not have material needs met for her/baby, referred to local resources for help obtaining these.  3. Sexuality, contraception and birth spacing Provided guidance regarding sexuality, management of dyspareunia, and resumption of intercourse Discussed avoiding interpregnancy interval <20mths and recommended birth spacing of 18 months  4. Sleep and fatigue Discussed coping options for fatigue and sleep disruption Encouraged family/partner/community support of 4 hrs of uninterrupted sleep to help with mood and fatigue  5. Physical recovery  If pt had a C/S, assessed incisional pain and providing guidance on normal vs prolonged recovery If pt had a laceration, perineal healing and pain reviewed.  If urinary or fecal  incontinence, discussed management and referred to PT or uro/gyn if indicated  Patient is safe to resume physical activity. Discussed attainment of healthy weight.  6.  Chronic disease management Discussed pregnancy complications if any, and their implications for future childbearing and long-term maternal health. Review recommendations for prevention of recurrent pregnancy complications yes, or aspirin to reduce risk of preeclampsia yes. Pt had GDM: no. If yes, 2hr GTT scheduled: not applicable. Reviewed medications and non-pregnant dosing including consideration of whether pt is breastfeeding using a reliable resource such as LactMed: yes Referred for f/u w/ PCP or subspecialist providers as indicated: yes  7. Health maintenance Mammogram at 34yo or earlier if indicated Pap smears as indicated  Meds:  Meds ordered this encounter  Medications   venlafaxine XR (EFFEXOR XR) 37.5 MG 24 hr capsule    Sig: Take 1 capsule (37.5 mg total) by mouth daily with breakfast.    Dispense:  30 capsule    Refill:  0   hydrochlorothiazide (HYDRODIURIL) 25 MG tablet    Sig: Take 1 tablet (25 mg total) by mouth daily.    Dispense:  30 tablet    Refill:  0    Follow-up: Return for Thurs nurse bp check online; then 35yr pap & physical.   Orders Placed This Encounter  Procedures   Amb ref to [facility]    Cheral Marker CNM, [facility] 01/24/2023 12:19 PM

## 2023-01-27 ENCOUNTER — Telehealth (INDEPENDENT_AMBULATORY_CARE_PROVIDER_SITE_OTHER): Payer: No Typology Code available for payment source | Admitting: *Deleted

## 2023-01-27 VITALS — BP 130/90

## 2023-01-27 DIAGNOSIS — I10 Essential (primary) hypertension: Secondary | ICD-10-CM

## 2023-01-27 NOTE — Progress Notes (Signed)
   NURSE VISIT- BLOOD PRESSURE CHECK  I connected with Donell Sievert on 01/27/2023 by MyChart video and verified that I am speaking with the correct person using two identifiers.   I discussed the limitations of evaluation and management by telemedicine. The patient expressed understanding and agreed to proceed.  Nurse is at the office, and patient is at home.  SUBJECTIVE:  Carrie Mckenzie is a 32 y.o. [id number] female here for BP check. She is postpartum, delivery date 5/6     HYPERTENSION ROS:  Postpartum:  Severe headaches that don't go away with tylenol/other medicines: No  Visual changes (seeing spots/double/blurred vision) No  Severe pain under right breast breast or in center of upper chest No  Severe nausea/vomiting No  Taking medicines as instructed yes  OBJECTIVE:  BP (!) 130/90 (BP Location: Left Arm, Patient Position: Sitting, Cuff Size: Normal)   LMP 04/03/2022 (Approximate)   Breastfeeding No   Appearance alert, well appearing, and in no distress.  ASSESSMENT: Postpartum  blood pressure check  PLAN: Discussed with Dr. Despina Hidden   Recommendations: no changes needed   Follow-up:  as needed    Jobe Marker  01/27/2023 11:27 AM

## 2023-02-01 NOTE — BH Specialist Note (Signed)
Integrated Behavioral Health via Telemedicine Visit  02/04/2023 Carrie Mckenzie [id number]  Number of Integrated Behavioral Health Clinician visits: 1- Initial Visit  Session Start time: 0818   Session End time: 0852  Total time in minutes: 34   Referring Provider: Shawna Mckenzie, CNM Patient/Family location: Home [facility] Provider location: [facility]  All persons participating in visit: Patient Carrie Mckenzie and [facility] Carrie Mckenzie   Types of Service: Individual psychotherapy and Video visit  I connected with Carrie Mckenzie and/or Carrie Mckenzie's  n/a  via  Telephone or [employer]  (Video is Caregility application) and verified that I am speaking with the correct person using two identifiers. Discussed confidentiality: Yes   I discussed the limitations of telemedicine and the availability of in person appointments.  Discussed there is a possibility of technology failure and discussed alternative modes of communication if that failure occurs.  I discussed that engaging in this telemedicine visit, they consent to the provision of behavioral healthcare and the services will be billed under their insurance.  Patient and/or legal guardian expressed understanding and consented to Telemedicine visit: Yes   Presenting Concerns: Patient and/or family reports the following symptoms/concerns: Anxiety, irritability, fatigue and lack of quality sleep; anxiety increases on nights partner is working; taking Effexor; open to implementing self-coping strategy today.  Duration of problem: Increase postpartum; Severity of problem: moderate  Patient and/or Family's Strengths/Protective Factors: Social connections, Concrete supports in place (healthy food, safe environments, etc.), and Sense of purpose  Goals Addressed: Patient will:  Reduce symptoms of: anxiety   Increase knowledge and/or ability of: healthy  habits and self-management skills   Demonstrate ability to: Increase healthy adjustment to current life circumstances  Progress towards Goals: Ongoing  Interventions: Interventions utilized:  Mindfulness or Management consultant, Psychoeducation and/or Health Education, and Link to [employer] Standardized Assessments completed: GAD-7 and PHQ 9  Patient and/or Family Response: Patient agrees with treatment plan.   Assessment: Patient currently experiencing Generalized anxiety disorder.   Patient may benefit from psychoeducation and brief therapeutic interventions regarding coping with symptoms of anxiety .  Plan: Follow up with behavioral health clinician on : Two weeks Behavioral recommendations:  -Continue taking BH medication as prescribed by PCP -CALM relaxation breathing exercise twice daily (morning; at bedtime with sleep sounds); as needed throughout the day. -Continue prioritizing healthy self-care (regular meals, adequate rest; allowing practical help from supportive friends and family as needed) -Consider new mom support group as needed at either www.[email or web address].net or [email or web address]   Referral(s): Integrated Art gallery manager (In Clinic) and [employer]:  new mom support  I discussed the assessment and treatment plan with the patient and/or parent/guardian. They were provided an opportunity to ask questions and all were answered. They agreed with the plan and demonstrated an understanding of the instructions.   They were advised to call back or seek an in-person evaluation if the symptoms worsen or if the condition fails to improve as anticipated.  Rae Lips, LCSW     02/04/2023    8:23 AM 07/07/2022    2:49 PM 12/16/2016    9:29 AM 09/28/2016    3:59 PM  Depression screen PHQ 2/9  Decreased Interest 1 2 0 0  Down, Depressed, Hopeless 0 1 0 0  PHQ - 2 Score 1 3 0 0  Altered sleeping 0 0 0   Tired, decreased energy 3 2 2     Change in appetite 3 2 2  Feeling bad or failure about yourself  1 2 0   Trouble concentrating 0 1 0   Moving slowly or fidgety/restless 0 0 0   Suicidal thoughts 0 0 0   PHQ-9 Score 8 10 4        02/04/2023    8:25 AM 07/07/2022    2:49 PM  GAD 7 : Generalized Anxiety Score  Nervous, Anxious, on Edge 3 2  Control/stop worrying 0 2  Worry too much - different things 1 2  Trouble relaxing 0 2  Restless 0 1  Easily annoyed or irritable 3 3  Afraid - awful might happen 1 2  Total GAD 7 Score 8 14

## 2023-02-04 ENCOUNTER — Ambulatory Visit (INDEPENDENT_AMBULATORY_CARE_PROVIDER_SITE_OTHER): Payer: Medicaid Other | Admitting: Clinical

## 2023-02-04 DIAGNOSIS — F419 Anxiety disorder, unspecified: Secondary | ICD-10-CM | POA: Diagnosis not present

## 2023-02-04 NOTE — Patient Instructions (Signed)
[facility] [address] [place], [place] [address] [phone] (main office) [phone] (Jayland Null's office)  New Parent Support Groups [email or web address].net [email or web address]  /Emotional Wellbeing Apps and Websites Here are a few free apps meant to help you to help yourself.  To find, try searching on the internet to see if the app is offered on Apple/Android devices. If your first choice doesn't come up on your device, the good news is that there are many choices! Play around with different apps to see which ones are helpful to you.    Calm This is an app meant to help increase calm feelings. Includes info, strategies, and tools for tracking your feelings.      Calm Harm  This app is meant to help with self-harm. Provides many 5-minute or 15-min coping strategies for doing instead of hurting yourself.       Healthy Minds Health Minds is a problem-solving tool to help deal with emotions and cope with stress you encounter wherever you are.      MindShift This app can help people cope with anxiety. Rather than trying to avoid anxiety, you can make an important shift and face it.      MY3  MY3 features a support system, safety plan and resources with the goal of offering a tool to use in a time of need.       My Life My Voice  This mood journal offers a simple solution for tracking your thoughts, feelings and moods. Animated emoticons can help identify your mood.       Relax Melodies Designed to help with sleep, on this app you can mix sounds and meditations for relaxation.      Smiling Mind Smiling Mind is meditation made easy: it's a simple tool that helps put a smile on your mind.        Stop, Breathe & Think  A friendly, simple guide for people through meditations for mindfulness and compassion.  Stop, Breathe and Think Kids Enter your current feelings and choose a "mission" to help you cope. Offers  videos for certain moods instead of just sound recordings.       Team Orange The goal of this tool is to help teens change how they think, act, and react. This app helps you focus on your own good feelings and experiences.      The Virtual Hope Box The Virtual Hope Box (VHB) contains simple tools to help patients with coping, relaxation, distraction, and positive thinking.     

## 2023-02-07 NOTE — BH Specialist Note (Signed)
Integrated Behavioral Health via Telemedicine Visit  02/21/2023 Carrie Mckenzie [id number]  Number of Integrated Behavioral Health Clinician visits: 2- Second Visit  Session Start time: 1550   Session End time: 1555  Total time in minutes: 5   Referring Provider: Shawna Clamp, CNM Patient/Family location: Car passenger/[facility] Provider location: [facility]  All persons participating in visit: Patient Carrie Mckenzie and [facility] [name] (pt currently passenger in car with partner and children)  Types of Service: Individual psychotherapy and Video visit  I connected with Donell Sievert and/or Melton Alar Ostermann's  n/a  via  Telephone or [employer]  (Video is Caregility application) and verified that I am speaking with the correct person using two identifiers. Discussed confidentiality: Yes   I discussed the limitations of telemedicine and the availability of in person appointments.  Discussed there is a possibility of technology failure and discussed alternative modes of communication if that failure occurs.  I discussed that engaging in this telemedicine visit, they consent to the provision of behavioral healthcare and the services will be billed under their insurance.  Patient and/or legal guardian expressed understanding and consented to Telemedicine visit: Yes   Presenting Concerns: Patient and/or family reports the following symptoms/concerns: Pt states her PCP switched her from Effexor to Lexapro and is noticing anxiety more manageable; continued poor appetite. Pt has no further questions or concerns.  Duration of problem: Postpartum increase; Severity of problem: moderate  Patient and/or Family's Strengths/Protective Factors: Social connections, Concrete supports in place (healthy food, safe environments, etc.), and Sense of purpose  Goals Addressed: Patient will:  Reduce symptoms  of: anxiety    Progress towards Goals: Ongoing  Interventions: Interventions utilized:  Supportive Reflection Standardized Assessments completed:  Not given today  Patient and/or Family Response: Patient agrees with treatment plan.   Assessment: Patient currently experiencing Generalized anxiety disorder.   Patient may benefit from psychoeducation and brief therapeutic interventions regarding coping with symptoms of anxiety .  Plan: Follow up with behavioral health clinician on : Call [name] at [phone], as needed. Behavioral recommendations:  -Continue taking Lexapro as prescribed by PCP -Continue to consider new mom support group at www.[email or web address].net as needed Referral(s): Integrated [employer] (In Clinic)  I discussed the assessment and treatment plan with the patient and/or parent/guardian. They were provided an opportunity to ask questions and all were answered. They agreed with the plan and demonstrated an understanding of the instructions.   They were advised to call back or seek an in-person evaluation if the symptoms worsen or if the condition fails to improve as anticipated.  Rae Lips, LCSW     02/04/2023    8:23 AM 07/07/2022    2:49 PM 12/16/2016    9:29 AM 09/28/2016    3:59 PM  Depression screen PHQ 2/9  Decreased Interest 1 2 0 0  Down, Depressed, Hopeless 0 1 0 0  PHQ - 2 Score 1 3 0 0  Altered sleeping 0 0 0   Tired, decreased energy 3 2 2    Change in appetite 3 2 2    Feeling bad or failure about yourself  1 2 0   Trouble concentrating 0 1 0   Moving slowly or fidgety/restless 0 0 0   Suicidal thoughts 0 0 0   PHQ-9 Score 8 10 4        02/04/2023    8:25 AM 07/07/2022    2:49 PM  GAD 7 :  Generalized Anxiety Score  Nervous, Anxious, on Edge 3 2  Control/stop worrying 0 2  Worry too much - different things 1 2  Trouble relaxing 0 2  Restless 0 1  Easily annoyed or irritable 3 3  Afraid - awful might happen 1 2  Total  GAD 7 Score 8 14

## 2023-02-21 ENCOUNTER — Ambulatory Visit: Payer: Medicaid Other | Admitting: Clinical

## 2023-02-21 DIAGNOSIS — F411 Generalized anxiety disorder: Secondary | ICD-10-CM

## 2023-02-21 NOTE — Patient Instructions (Signed)
[facility] [address] [place], [place] [address] [phone] (main office) [phone] (Jamie's office)  New Parent Support Groups [email or web address].net [email or web address]   

## 2023-03-16 ENCOUNTER — Other Ambulatory Visit: Payer: Self-pay | Admitting: Advanced Practice Midwife

## 2023-03-16 ENCOUNTER — Encounter: Payer: Self-pay | Admitting: Women's Health

## 2023-03-16 MED ORDER — NORETHINDRONE 0.35 MG PO TABS: 1.0000 | ORAL_TABLET | Freq: Every day | ORAL | 11 refills | Status: DC

## 2024-03-23 ENCOUNTER — Ambulatory Visit: Admitting: Obstetrics & Gynecology

## 2024-06-06 ENCOUNTER — Encounter: Payer: Self-pay | Admitting: Obstetrics & Gynecology

## 2024-06-06 ENCOUNTER — Other Ambulatory Visit (HOSPITAL_COMMUNITY)
Admission: RE | Admit: 2024-06-06 | Discharge: 2024-06-06 | Disposition: A | Discharge status: Home / Self Care | Source: Ambulatory Visit | Attending: Obstetrics & Gynecology | Admitting: Obstetrics & Gynecology

## 2024-06-06 ENCOUNTER — Ambulatory Visit (INDEPENDENT_AMBULATORY_CARE_PROVIDER_SITE_OTHER): Admitting: Obstetrics & Gynecology

## 2024-06-06 VITALS — BP 146/97 | HR 77 | Ht 66.0 in | Wt 152.8 lb

## 2024-06-06 DIAGNOSIS — Z113 Encounter for screening for infections with a predominantly sexual mode of transmission: Secondary | ICD-10-CM | POA: Diagnosis present

## 2024-06-06 DIAGNOSIS — I10 Essential (primary) hypertension: Secondary | ICD-10-CM | POA: Diagnosis not present

## 2024-06-06 DIAGNOSIS — Z1151 Encounter for screening for human papillomavirus (HPV): Secondary | ICD-10-CM

## 2024-06-06 DIAGNOSIS — Z01419 Encounter for gynecological examination (general) (routine) without abnormal findings: Secondary | ICD-10-CM | POA: Diagnosis not present

## 2024-06-06 DIAGNOSIS — Z124 Encounter for screening for malignant neoplasm of cervix: Secondary | ICD-10-CM | POA: Insufficient documentation

## 2024-06-06 NOTE — Progress Notes (Signed)
 WELL-WOMAN EXAMINATION Patient name: Carrie Mckenzie MRN [id number]  Date of birth: 1991-05-25 Chief Complaint:   Annual Exam  History of Present Illness:   Carrie Mckenzie is a 33 y.o. [phone]  female being seen today for a routine well-woman exam.   Pt worked to improve BP and transition back to COC.  Loves only having a period 4x per yr. Notes she did not yet take her medication since she works nights.  BP normal within normal range.  Denies irregular discharge, itching or irritation.  Denies pelvic or abdominal pain.  Previously noted occasional postcoital spotting, not currently sexually active.   Patient's last menstrual period was 05/29/2024 (approximate).  The current method of family planning is on COCs   Last pap 2022- due today.  Last mammogram: NA. Last colonoscopy: NA     06/06/2024    1:36 PM 02/04/2023    8:23 AM 07/07/2022    2:49 PM 12/16/2016    9:29 AM 09/28/2016    3:59 PM  Depression screen PHQ 2/9  Decreased Interest 1 1 2  0 0  Down, Depressed, Hopeless 0 0 1 0 0  PHQ - 2 Score 1 1 3  0 0  Altered sleeping 3 0 0 0   Tired, decreased energy 3 3 2 2    Change in appetite 3 3 2 2    Feeling bad or failure about yourself  0 1 2 0   Trouble concentrating 0 0 1 0   Moving slowly or fidgety/restless 0 0 0 0   Suicidal thoughts 0 0 0 0    PHQ-9 Score 10 8 10 4       Data saved with a previous flowsheet row definition      Review of Systems:   Pertinent items are noted in HPI Denies any headaches, blurred vision, fatigue, shortness of breath, chest pain, abdominal pain, bowel movements, urination, or intercourse unless otherwise stated above.  Pertinent History Reviewed:  Reviewed past medical,surgical, social and family history.  Reviewed problem list, medications and allergies. Physical Assessment:   Vitals:   06/06/24 1328 06/06/24 1333  BP: (!) 164/88 (!) 146/97  Pulse: 77   Weight: 152 lb 12.8 oz (69.3 kg)   Height: 5' 6 (1.676 m)   Body  mass index is 24.66 kg/m.        Physical Examination:   General appearance - well appearing, and in no distress  Mental status - alert, oriented to person, place, and time  Psych:  She has a normal mood and affect  Skin - warm and dry, normal color, no suspicious lesions noted  Chest - effort normal, all lung fields clear to auscultation bilaterally  Heart - normal rate and regular rhythm  Neck:  midline trachea, no thyromegaly or nodules  Breasts - breasts appear normal, no suspicious masses, no skin or nipple changes or  axillary nodes  Abdomen - soft, nontender, nondistended, no masses or organomegaly  Pelvic - VULVA: normal appearing vulva with no masses, tenderness or lesions  VAGINA: normal appearing vagina with normal color and discharge, no lesions  CERVIX: polypoid-like tissue noted at cervical os.  No friability.    Thin prep pap is done with HR HPV cotesting  UTERUS: uterus is felt to be normal size, shape, consistency and nontender   ADNEXA: No adnexal masses or tenderness noted.  Extremities:  No swelling or varicosities noted  Chaperone: Aleck Blase     Assessment & Plan:  1) Well-Woman Exam -pap collected, reviewed ASCCP  guidelines -desires STI screening  2) Contraceptive management -for now plan to continue with current pill -reviewed that with uncontrolled HTN- COCs would be contraindicated -briefly encouraged pt to reconsider alternative options- for now wishes to continue with current OCP  Orders Placed This Encounter  Procedures   HIV Antibody (routine testing w rflx)   RPR    Meds: No orders of the defined types were placed in this encounter.   Follow-up: Return in about 1 year (around 06/06/2025) for Annual.   Iyonna Rish, DO Attending Obstetrician & Gynecologist, [facility] for [facility], [facility]

## 2024-06-07 ENCOUNTER — Ambulatory Visit: Payer: Self-pay | Admitting: Obstetrics & Gynecology

## 2024-06-07 LAB — HIV ANTIBODY (ROUTINE TESTING W REFLEX): HIV Screen 4th Generation wRfx: NONREACTIVE

## 2024-06-07 LAB — RPR: RPR Ser Ql: NONREACTIVE

## 2024-06-14 LAB — CYTOLOGY - PAP
Chlamydia: NEGATIVE
Comment: NEGATIVE
Comment: NEGATIVE
Comment: NEGATIVE
Comment: NEGATIVE
Comment: NORMAL
Diagnosis: UNDETERMINED — AB
HPV 16: NEGATIVE
HPV 18 / 45: NEGATIVE
High risk HPV: POSITIVE — AB
Neisseria Gonorrhea: NEGATIVE

## 2024-08-21 ENCOUNTER — Encounter: Payer: Self-pay | Admitting: Women's Health

## 2024-08-21 ENCOUNTER — Other Ambulatory Visit (HOSPITAL_COMMUNITY)
Admission: RE | Admit: 2024-08-21 | Discharge: 2024-08-21 | Disposition: A | Discharge status: Home / Self Care | Source: Ambulatory Visit | Attending: Women's Health | Admitting: Women's Health

## 2024-08-21 ENCOUNTER — Ambulatory Visit (INDEPENDENT_AMBULATORY_CARE_PROVIDER_SITE_OTHER): Admitting: Women's Health

## 2024-08-21 VITALS — BP 149/83 | HR 87 | Ht 66.0 in | Wt 163.0 lb

## 2024-08-21 DIAGNOSIS — Z3202 Encounter for pregnancy test, result negative: Secondary | ICD-10-CM | POA: Diagnosis not present

## 2024-08-21 DIAGNOSIS — R87810 Cervical high risk human papillomavirus (HPV) DNA test positive: Secondary | ICD-10-CM | POA: Insufficient documentation

## 2024-08-21 DIAGNOSIS — R87610 Atypical squamous cells of undetermined significance on cytologic smear of cervix (ASC-US): Secondary | ICD-10-CM | POA: Insufficient documentation

## 2024-08-21 DIAGNOSIS — D069 Carcinoma in situ of cervix, unspecified: Secondary | ICD-10-CM

## 2024-08-21 LAB — POCT URINE PREGNANCY: Preg Test, Ur: NEGATIVE

## 2024-08-21 NOTE — Addendum Note (Signed)
 Addended by: SANNA GONG A on: 08/21/2024 01:30 PM   Modules accepted: Orders

## 2024-08-21 NOTE — Patient Instructions (Signed)
 Colposcopy, Care After  The following information offers guidance on how to care for yourself after your procedure. Your health care provider may also give you more specific instructions. If you have problems or questions, contact your health care provider. What can I expect after the procedure? If you had a colposcopy without a biopsy, you can expect to feel fine right away after your procedure. However, you may have some spotting of blood for a few days. You can return to your normal activities. If you had a colposcopy with a biopsy, it is common after the procedure to have: Soreness and mild pain. These may last for a few days. Mild vaginal bleeding or discharge that is dark-colored and grainy. This may last for a few days. The discharge may be caused by a liquid (solution) that was used during the procedure. You may need to wear a sanitary pad during this time. Spotting of blood for at least 48 hours after the procedure. Follow these instructions at home: Medicines Take over-the-counter and prescription medicines only as told by your health care provider. Talk with your health care provider about what type of over-the-counter pain medicines and prescription medicines you can start to take again. It is especially important to talk with your health care provider if you take blood thinners. Activity Avoid using douche products, using tampons, and having sex for at least 3 days after the procedure or for as long as told by your health care provider. Return to your normal activities as told by your health care provider. Ask your health care provider what activities are safe for you. General instructions Ask your health care provider if you may take baths, swim, or use a hot tub. You may take showers. If you use birth control (contraception), continue to use it. Keep all follow-up visits. This is important. Contact a health care provider if: You have a fever or chills. You faint or feel  light-headed. Get help right away if: You have heavy bleeding from your vagina or pass blood clots. Heavy bleeding is bleeding that soaks through a sanitary pad in less than 1 hour. You have vaginal discharge that is abnormal, is yellow in color, or smells bad. This could be a sign of infection. You have severe pain or cramps in your lower abdomen that do not go away with medicine. Summary If you had a colposcopy without a biopsy, you can expect to feel fine right away, but you may have some spotting of blood for a few days. You can return to your normal activities. If you had a colposcopy with a biopsy, it is common to have mild pain for a few days and spotting for 48 hours after the procedure. Avoid using douche products, using tampons, and having sex for at least 3 days after the procedure or for as long as told by your health care provider. Get help right away if you have heavy bleeding, severe pain, or signs of infection. This information is not intended to replace advice given to you by your health care provider. Make sure you discuss any questions you have with your health care provider. Document Revised: 12/21/2020 Document Reviewed: 12/21/2020 Elsevier Patient Education  2024 [employer].

## 2024-08-21 NOTE — Progress Notes (Signed)
" ° °  COLPOSCOPY PROCEDURE NOTE Patient name: Carrie Mckenzie MRN [id number]  Date of birth: 1990/11/19 Subjective Findings:   Carrie Mckenzie is a 34 y.o. [id number] African American female being seen today for a colposcopy. 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 Contraception: OCP (estrogen/progesterone). Menopausal: no. Hysterectomy: no.   Considering pregnancy: No New sex partner: no Smoker: vapes. Immunocompromised: no.   The risks and benefits were explained and informed consent was obtained, and written copy is in chart. Pertinent History Reviewed:   Reviewed past medical,surgical, social, obstetrical and family history.  Reviewed problem list, medications and allergies. Objective Findings & Procedure:   Vitals:   08/21/24 1004 08/21/24 1011  BP: (!) 162/89 (!) 149/83  Pulse: 84 87  Weight: 163 lb (73.9 kg)   Height: 5' 6 (1.676 m)   Body mass index is 26.31 kg/m.  Results for orders placed or performed in visit on 08/21/24 (from the past 24 hours)  POCT urine pregnancy   Collection Time: 08/21/24 10:08 AM  Result Value Ref Range   Preg Test, Ur Negative Negative     Time out was performed.  Speculum placed in the vagina, cervix fully visualized. SCJ: not fully visualized, h/o laser ablation. Cervix swabbed x 3 with acetic acid .  Acetowhitening present: Yes Cervix: no visible lesions, no mosaicism, no punctation, no abnormal vasculature, and acetowhite lesion(s) noted at 6 & 9 o'clock. Endocervical curettage performed, Cervical biopsies taken at 6 & 9 o'clock, and Hemostasis achieved  with Monsel's solution. Vagina: vaginal colposcopy not performed Vulva: vulvar colposcopy not performed  Specimens: 3  Complications: none  Chaperone: Winton Cherry  Colposcopic Impression & Plan:   Colposcopy findings consistent with LSIL Plan: Post biopsy instructions given, Will notify patient of results when back, and Will base plan of care on pathology results and ASCCP guidelines  CHTN on meds, takes hydrochlorothiazide  at night, sees PCP tomorrow to adjust meds. Let us  know if not controlled and needs to change COCs to POPs/other contraception. Discussed risk of MI, CVA, etc w/ uncontrolled HTN and CHC  Return for after 10/29 for , Pap & physical.  Suzen JONELLE Fetters CNM, WHNP-BC 08/21/2024 10:50 AM  "

## 2024-08-23 ENCOUNTER — Ambulatory Visit: Payer: Self-pay | Admitting: Women's Health

## 2024-08-23 DIAGNOSIS — D069 Carcinoma in situ of cervix, unspecified: Secondary | ICD-10-CM

## 2024-08-23 LAB — SURGICAL PATHOLOGY

## 2024-08-27 ENCOUNTER — Encounter: Payer: Self-pay | Admitting: Obstetrics & Gynecology

## 2024-08-27 ENCOUNTER — Ambulatory Visit: Admitting: Obstetrics & Gynecology

## 2024-08-27 VITALS — BP 148/90 | HR 88 | Ht 66.0 in | Wt 161.8 lb

## 2024-08-27 DIAGNOSIS — D069 Carcinoma in situ of cervix, unspecified: Secondary | ICD-10-CM | POA: Diagnosis not present

## 2024-08-27 DIAGNOSIS — R87613 High grade squamous intraepithelial lesion on cytologic smear of cervix (HGSIL): Secondary | ICD-10-CM

## 2024-08-27 NOTE — Progress Notes (Signed)
 Follow up appointment for results: Cervical biopsy  Chief Complaint  Patient presents with   Discuss colpo results    Blood pressure (!) 148/90, pulse 88, height 5' 6 (1.676 m), weight 161 lb 12.8 oz (73.4 kg), last menstrual period 06/06/2024, not currently breastfeeding.  No results found.  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   Orders for this encounter: Orders Placed This Encounter  Procedures   Ambulatory Referral For Surgery Scheduling    Impression + Management Plan   ICD-10-CM   1. High grade squamous intraepithelial cervical dysplasia  R87.613 Ambulatory Referral For Surgery Scheduling      Follow Up: Return in about 8 weeks (around 10/19/2024) for Post Op, with Dr Jayne.     All questions were answered.  Past Medical History:  Diagnosis Date   Anxiety    BV (bacterial vaginosis) 12/17/2013   Depression    Hypertension    Hypothyroidism    Ovarian cyst    Vaginal discharge 12/17/2013    Past Surgical History:  Procedure Laterality Date   CERVICAL ABLATION N/A 04/02/2020   Procedure: LASER ABLATION OF CERVIX;  Surgeon: Jayne Vonn DEL, MD;  Location: [facility];  Service: Gynecology;  Laterality: N/A;   CHOLECYSTECTOMY     WISDOM TOOTH EXTRACTION      OB History     Gravida  5   Para  4   Term  3   Preterm  1   AB  1   Living  4      SAB  1   IAB  0   Ectopic  0   Multiple  0   Live Births  4           Allergies[1]  Social History   Socioeconomic History   Marital status: Single    Spouse name: Not on file   Number of children: Not on file   Years of education: Not on file   Highest education level: Not on file  Occupational History   Not on file  Tobacco Use   Smoking  status: Former    Current packs/day: 0.50    Average packs/day: 0.5 packs/day for 10.0 years (5.0 ttl pk-yrs)    Types: Cigarettes   Smokeless tobacco: Never  Vaping Use   Vaping status: Every Day   Substances: Nicotine  Substance and Sexual Activity   Alcohol use: Not Currently    Comment: rarely   Drug use: No   Sexual activity: Yes    Birth control/protection: Pill  Other Topics Concern   Not on file  Social History Narrative   Not on file   Social Drivers of Health   Tobacco Use: Medium Risk (08/27/2024)   Patient History    Smoking Tobacco Use: Former    Smokeless Tobacco Use: Never    Passive Exposure: Not on Actuary Strain: Low Risk (06/06/2024)   Overall Financial Resource Strain (CARDIA)    Difficulty of Paying Living Expenses: Not very hard  Food Insecurity: No Food Insecurity (06/06/2024)   Epic    Worried About Radiation Protection Practitioner of Food in the Last Year: Never true    Ran Out of Food in the Last Year: Never true  Transportation Needs: No Transportation Needs (06/06/2024)  Epic    Lack of Transportation (Medical): No    Lack of Transportation (Non-Medical): No  Physical Activity: Inactive (06/06/2024)   Exercise Vital Sign    Days of Exercise per Week: 0 days    Minutes of Exercise per Session: 0 min  Stress: No Stress Concern Present (06/06/2024)   [employer] of Occupational Health - Occupational Stress Questionnaire    Feeling of Stress: Only a little  Social Connections: Socially Isolated (06/06/2024)   Social Connection and Isolation Panel    Frequency of Communication with Friends and Family: More than three times a week    Frequency of Social Gatherings with Friends and Family: More than three times a week    Attends Religious Services: Never    Database Administrator or Organizations: No    Attends Banker Meetings: Never    Marital Status: Never married  Depression (PHQ2-9): Medium Risk (06/06/2024)   Depression  (PHQ2-9)    PHQ-2 Score: 10  Alcohol Screen: Low Risk (06/06/2024)   Alcohol Screen    Last Alcohol Screening Score (AUDIT): 2  Housing: Low Risk (06/06/2024)   Epic    Unable to Pay for Housing in the Last Year: No    Number of Times Moved in the Last Year: 0    Homeless in the Last Year: No  Utilities: Not At Risk (06/06/2024)   Epic    Threatened with loss of utilities: No  Health Literacy: Not on file    Family History  Problem Relation Age of Onset   Cancer Maternal Grandmother        ovarian, uterine   COPD Maternal Grandfather    Emphysema Maternal Grandfather    Hypertension Father    Cancer Father        prostate   Depression Father    Anxiety disorder Father    Cirrhosis Father    COPD Father    Kidney disease Father    Diabetes Mother    Thyroid disease Mother        had thyroid removed   Hypertension Mother    Heart attack Mother    ADD / ADHD Daughter    Diabetes Maternal Aunt    Diabetes Maternal Aunt    Diabetes Maternal Aunt    Diabetes Maternal Uncle    Diabetes Maternal Uncle    Diabetes Maternal Uncle    Diabetes Maternal Uncle    Diabetes Maternal Uncle    Diabetes Maternal Uncle    Diabetes Maternal Uncle    Asthma Paternal Aunt        [1] No Known Allergies

## 2024-09-04 ENCOUNTER — Encounter: Payer: Self-pay | Admitting: Obstetrics & Gynecology

## 2024-10-05 ENCOUNTER — Other Ambulatory Visit (HOSPITAL_COMMUNITY)

## 2024-10-10 ENCOUNTER — Ambulatory Visit (HOSPITAL_COMMUNITY): Admit: 2024-10-10 | Admitting: Obstetrics & Gynecology

## 2024-10-19 ENCOUNTER — Encounter: Admitting: Obstetrics & Gynecology
